# Patient Record
Sex: Female | Born: 1955 | Race: White | Hispanic: No | Smoking: Never smoker
Health system: Southern US, Community
[De-identification: ages and names within clinical notes are randomized; demographics above are authoritative.]

## PROBLEM LIST (undated history)

## (undated) DIAGNOSIS — Z9889 Other specified postprocedural states: Secondary | ICD-10-CM

## (undated) DIAGNOSIS — T7840XA Allergy, unspecified, initial encounter: Secondary | ICD-10-CM

## (undated) DIAGNOSIS — IMO0002 Reserved for concepts with insufficient information to code with codable children: Secondary | ICD-10-CM

## (undated) DIAGNOSIS — C801 Malignant (primary) neoplasm, unspecified: Secondary | ICD-10-CM

## (undated) DIAGNOSIS — R0789 Other chest pain: Secondary | ICD-10-CM

## (undated) DIAGNOSIS — L918 Other hypertrophic disorders of the skin: Secondary | ICD-10-CM

## (undated) DIAGNOSIS — I351 Nonrheumatic aortic (valve) insufficiency: Secondary | ICD-10-CM

## (undated) DIAGNOSIS — R569 Unspecified convulsions: Secondary | ICD-10-CM

## (undated) DIAGNOSIS — N8189 Other female genital prolapse: Secondary | ICD-10-CM

## (undated) DIAGNOSIS — F32A Depression, unspecified: Secondary | ICD-10-CM

## (undated) DIAGNOSIS — R102 Pelvic and perineal pain: Secondary | ICD-10-CM

## (undated) DIAGNOSIS — F419 Anxiety disorder, unspecified: Secondary | ICD-10-CM

## (undated) DIAGNOSIS — R9430 Abnormal result of cardiovascular function study, unspecified: Secondary | ICD-10-CM

## (undated) DIAGNOSIS — I472 Ventricular tachycardia: Secondary | ICD-10-CM

## (undated) DIAGNOSIS — K589 Irritable bowel syndrome without diarrhea: Secondary | ICD-10-CM

## (undated) DIAGNOSIS — M1990 Unspecified osteoarthritis, unspecified site: Secondary | ICD-10-CM

## (undated) DIAGNOSIS — R001 Bradycardia, unspecified: Secondary | ICD-10-CM

## (undated) DIAGNOSIS — K219 Gastro-esophageal reflux disease without esophagitis: Secondary | ICD-10-CM

## (undated) DIAGNOSIS — N816 Rectocele: Secondary | ICD-10-CM

## (undated) DIAGNOSIS — F329 Major depressive disorder, single episode, unspecified: Secondary | ICD-10-CM

## (undated) DIAGNOSIS — S0300XA Dislocation of jaw, unspecified side, initial encounter: Secondary | ICD-10-CM

## (undated) DIAGNOSIS — E7800 Pure hypercholesterolemia, unspecified: Secondary | ICD-10-CM

## (undated) DIAGNOSIS — J45909 Unspecified asthma, uncomplicated: Secondary | ICD-10-CM

## (undated) HISTORY — DX: Reserved for concepts with insufficient information to code with codable children: IMO0002

## (undated) HISTORY — DX: Allergy, unspecified, initial encounter: T78.40XA

## (undated) HISTORY — PX: ABDOMINAL SURGERY: SHX537

## (undated) HISTORY — PX: NOSE SURGERY: SHX723

## (undated) HISTORY — DX: Other hypertrophic disorders of the skin: L91.8

## (undated) HISTORY — PX: NISSEN FUNDOPLICATION: SHX2091

## (undated) HISTORY — DX: Pelvic and perineal pain: R10.2

## (undated) HISTORY — DX: Unspecified asthma, uncomplicated: J45.909

## (undated) HISTORY — PX: CERVICAL ABLATION: SHX5771

## (undated) HISTORY — PX: TONSILLECTOMY: SUR1361

## (undated) HISTORY — DX: Abnormal result of cardiovascular function study, unspecified: R94.30

## (undated) HISTORY — PX: EXTERNAL EAR SURGERY: SHX627

## (undated) HISTORY — DX: Major depressive disorder, single episode, unspecified: F32.9

## (undated) HISTORY — DX: Gastro-esophageal reflux disease without esophagitis: K21.9

## (undated) HISTORY — DX: Irritable bowel syndrome, unspecified: K58.9

## (undated) HISTORY — DX: Dislocation of jaw, unspecified side, initial encounter: S03.00XA

## (undated) HISTORY — DX: Ventricular tachycardia: I47.2

## (undated) HISTORY — DX: Depression, unspecified: F32.A

## (undated) HISTORY — DX: Unspecified convulsions: R56.9

## (undated) HISTORY — PX: CHOLECYSTECTOMY: SHX55

## (undated) HISTORY — PX: SHOULDER SURGERY: SHX246

## (undated) HISTORY — PX: HERNIA REPAIR: SHX51

## (undated) HISTORY — DX: Nonrheumatic aortic (valve) insufficiency: I35.1

## (undated) HISTORY — DX: Other chest pain: R07.89

## (undated) HISTORY — DX: Anxiety disorder, unspecified: F41.9

## (undated) HISTORY — DX: Malignant (primary) neoplasm, unspecified: C80.1

## (undated) HISTORY — DX: Other specified postprocedural states: Z98.890

## (undated) HISTORY — DX: Rectocele: N81.6

## (undated) HISTORY — PX: ABDOMINAL HYSTERECTOMY: SHX81

## (undated) HISTORY — DX: Bradycardia, unspecified: R00.1

## (undated) HISTORY — DX: Other female genital prolapse: N81.89

---

## 1998-10-26 HISTORY — PX: NISSEN FUNDOPLICATION: SHX2091

## 2001-03-24 ENCOUNTER — Ambulatory Visit (HOSPITAL_COMMUNITY): Admission: RE | Admit: 2001-03-24 | Discharge: 2001-03-24 | Payer: Self-pay | Admitting: Family Medicine

## 2001-03-24 ENCOUNTER — Encounter: Payer: Self-pay | Admitting: Family Medicine

## 2001-03-29 ENCOUNTER — Ambulatory Visit (HOSPITAL_COMMUNITY): Admission: RE | Admit: 2001-03-29 | Discharge: 2001-03-29 | Payer: Self-pay | Admitting: Internal Medicine

## 2001-04-05 ENCOUNTER — Ambulatory Visit (HOSPITAL_COMMUNITY): Admission: RE | Admit: 2001-04-05 | Discharge: 2001-04-05 | Payer: Self-pay | Admitting: Internal Medicine

## 2001-04-05 ENCOUNTER — Encounter: Payer: Self-pay | Admitting: Internal Medicine

## 2001-04-13 ENCOUNTER — Other Ambulatory Visit: Admission: RE | Admit: 2001-04-13 | Discharge: 2001-04-13 | Payer: Self-pay | Admitting: Obstetrics and Gynecology

## 2001-04-14 ENCOUNTER — Encounter: Payer: Self-pay | Admitting: Obstetrics and Gynecology

## 2001-04-14 ENCOUNTER — Ambulatory Visit (HOSPITAL_COMMUNITY): Admission: RE | Admit: 2001-04-14 | Discharge: 2001-04-14 | Payer: Self-pay | Admitting: Obstetrics and Gynecology

## 2001-06-07 ENCOUNTER — Encounter: Payer: Self-pay | Admitting: Family Medicine

## 2001-06-07 ENCOUNTER — Ambulatory Visit (HOSPITAL_COMMUNITY): Admission: RE | Admit: 2001-06-07 | Discharge: 2001-06-07 | Payer: Self-pay | Admitting: Family Medicine

## 2001-09-07 ENCOUNTER — Ambulatory Visit (HOSPITAL_COMMUNITY): Admission: RE | Admit: 2001-09-07 | Discharge: 2001-09-07 | Payer: Self-pay | Admitting: General Surgery

## 2001-09-09 ENCOUNTER — Ambulatory Visit (HOSPITAL_COMMUNITY): Admission: RE | Admit: 2001-09-09 | Discharge: 2001-09-09 | Payer: Self-pay | Admitting: General Surgery

## 2001-12-28 ENCOUNTER — Ambulatory Visit: Admission: RE | Admit: 2001-12-28 | Discharge: 2001-12-28 | Payer: Self-pay | Admitting: Otolaryngology

## 2001-12-28 ENCOUNTER — Encounter: Payer: Self-pay | Admitting: Otolaryngology

## 2001-12-28 ENCOUNTER — Ambulatory Visit (HOSPITAL_COMMUNITY): Admission: RE | Admit: 2001-12-28 | Discharge: 2001-12-28 | Payer: Self-pay | Admitting: Otolaryngology

## 2002-04-25 ENCOUNTER — Encounter: Payer: Self-pay | Admitting: Obstetrics and Gynecology

## 2002-04-25 ENCOUNTER — Encounter: Payer: Self-pay | Admitting: Family Medicine

## 2002-04-25 ENCOUNTER — Ambulatory Visit (HOSPITAL_COMMUNITY): Admission: RE | Admit: 2002-04-25 | Discharge: 2002-04-25 | Payer: Self-pay | Admitting: Family Medicine

## 2002-04-25 ENCOUNTER — Ambulatory Visit (HOSPITAL_COMMUNITY): Admission: RE | Admit: 2002-04-25 | Discharge: 2002-04-25 | Payer: Self-pay | Admitting: Obstetrics and Gynecology

## 2002-08-11 ENCOUNTER — Encounter: Payer: Self-pay | Admitting: Family Medicine

## 2002-08-11 ENCOUNTER — Ambulatory Visit (HOSPITAL_COMMUNITY): Admission: RE | Admit: 2002-08-11 | Discharge: 2002-08-11 | Payer: Self-pay | Admitting: Family Medicine

## 2003-07-04 ENCOUNTER — Ambulatory Visit (HOSPITAL_COMMUNITY): Admission: RE | Admit: 2003-07-04 | Discharge: 2003-07-04 | Payer: Self-pay | Admitting: Obstetrics and Gynecology

## 2003-07-04 ENCOUNTER — Encounter: Payer: Self-pay | Admitting: Obstetrics and Gynecology

## 2003-08-08 ENCOUNTER — Ambulatory Visit (HOSPITAL_COMMUNITY): Admission: RE | Admit: 2003-08-08 | Discharge: 2003-08-08 | Payer: Self-pay | Admitting: Otolaryngology

## 2003-08-08 ENCOUNTER — Encounter: Payer: Self-pay | Admitting: Otolaryngology

## 2003-08-09 ENCOUNTER — Encounter: Payer: Self-pay | Admitting: Family Medicine

## 2003-08-09 ENCOUNTER — Ambulatory Visit (HOSPITAL_COMMUNITY): Admission: RE | Admit: 2003-08-09 | Discharge: 2003-08-09 | Payer: Self-pay | Admitting: Family Medicine

## 2003-12-18 ENCOUNTER — Ambulatory Visit (HOSPITAL_COMMUNITY): Admission: RE | Admit: 2003-12-18 | Discharge: 2003-12-18 | Payer: Self-pay | Admitting: Internal Medicine

## 2003-12-28 ENCOUNTER — Ambulatory Visit (HOSPITAL_COMMUNITY): Admission: RE | Admit: 2003-12-28 | Discharge: 2003-12-28 | Payer: Self-pay | Admitting: Family Medicine

## 2004-03-18 ENCOUNTER — Ambulatory Visit (HOSPITAL_COMMUNITY): Admission: RE | Admit: 2004-03-18 | Discharge: 2004-03-18 | Payer: Self-pay | Admitting: Otolaryngology

## 2004-07-04 ENCOUNTER — Encounter: Admission: RE | Admit: 2004-07-04 | Discharge: 2004-07-04 | Payer: Self-pay | Admitting: Family Medicine

## 2004-07-11 ENCOUNTER — Ambulatory Visit (HOSPITAL_COMMUNITY): Admission: RE | Admit: 2004-07-11 | Discharge: 2004-07-11 | Payer: Self-pay | Admitting: Family Medicine

## 2004-09-08 ENCOUNTER — Encounter (INDEPENDENT_AMBULATORY_CARE_PROVIDER_SITE_OTHER): Payer: Self-pay | Admitting: *Deleted

## 2004-09-08 ENCOUNTER — Ambulatory Visit (HOSPITAL_BASED_OUTPATIENT_CLINIC_OR_DEPARTMENT_OTHER): Admission: RE | Admit: 2004-09-08 | Discharge: 2004-09-08 | Payer: Self-pay | Admitting: Otolaryngology

## 2004-09-08 ENCOUNTER — Ambulatory Visit (HOSPITAL_COMMUNITY): Admission: RE | Admit: 2004-09-08 | Discharge: 2004-09-08 | Payer: Self-pay | Admitting: Otolaryngology

## 2005-01-29 ENCOUNTER — Ambulatory Visit: Payer: Self-pay | Admitting: Family Medicine

## 2005-02-19 ENCOUNTER — Ambulatory Visit (HOSPITAL_COMMUNITY): Admission: RE | Admit: 2005-02-19 | Discharge: 2005-02-19 | Payer: Self-pay | Admitting: Family Medicine

## 2005-02-24 ENCOUNTER — Ambulatory Visit (HOSPITAL_COMMUNITY): Admission: RE | Admit: 2005-02-24 | Discharge: 2005-02-24 | Payer: Self-pay | Admitting: Family Medicine

## 2005-03-09 ENCOUNTER — Ambulatory Visit (HOSPITAL_COMMUNITY): Admission: RE | Admit: 2005-03-09 | Discharge: 2005-03-09 | Payer: Self-pay | Admitting: Family Medicine

## 2006-01-12 ENCOUNTER — Ambulatory Visit (HOSPITAL_COMMUNITY): Admission: RE | Admit: 2006-01-12 | Discharge: 2006-01-12 | Payer: Self-pay | Admitting: Family Medicine

## 2006-01-14 ENCOUNTER — Ambulatory Visit (HOSPITAL_COMMUNITY): Admission: RE | Admit: 2006-01-14 | Discharge: 2006-01-14 | Payer: Self-pay | Admitting: Orthopaedic Surgery

## 2006-02-02 ENCOUNTER — Ambulatory Visit (HOSPITAL_COMMUNITY): Admission: RE | Admit: 2006-02-02 | Discharge: 2006-02-02 | Payer: Self-pay | Admitting: Family Medicine

## 2006-04-05 ENCOUNTER — Ambulatory Visit: Payer: Self-pay | Admitting: Internal Medicine

## 2006-04-14 ENCOUNTER — Ambulatory Visit (HOSPITAL_COMMUNITY): Admission: RE | Admit: 2006-04-14 | Discharge: 2006-04-14 | Payer: Self-pay | Admitting: Internal Medicine

## 2006-04-14 ENCOUNTER — Ambulatory Visit: Payer: Self-pay | Admitting: Internal Medicine

## 2006-04-14 HISTORY — PX: ESOPHAGOGASTRODUODENOSCOPY: SHX1529

## 2006-07-22 ENCOUNTER — Encounter: Admission: RE | Admit: 2006-07-22 | Discharge: 2006-07-22 | Payer: Self-pay | Admitting: Family Medicine

## 2006-10-05 ENCOUNTER — Ambulatory Visit: Payer: Self-pay | Admitting: Internal Medicine

## 2006-10-08 ENCOUNTER — Ambulatory Visit (HOSPITAL_COMMUNITY): Admission: RE | Admit: 2006-10-08 | Discharge: 2006-10-08 | Payer: Self-pay | Admitting: Internal Medicine

## 2006-10-25 ENCOUNTER — Ambulatory Visit: Payer: Self-pay | Admitting: Internal Medicine

## 2007-04-14 ENCOUNTER — Encounter: Admission: RE | Admit: 2007-04-14 | Discharge: 2007-04-14 | Payer: Self-pay | Admitting: Family Medicine

## 2007-08-08 ENCOUNTER — Encounter (HOSPITAL_COMMUNITY): Admission: RE | Admit: 2007-08-08 | Discharge: 2007-09-07 | Payer: Self-pay | Admitting: Orthopedic Surgery

## 2008-04-11 ENCOUNTER — Emergency Department (HOSPITAL_COMMUNITY): Admission: EM | Admit: 2008-04-11 | Discharge: 2008-04-12 | Payer: Self-pay | Admitting: Emergency Medicine

## 2009-10-15 ENCOUNTER — Encounter: Payer: Self-pay | Admitting: Family Medicine

## 2009-10-15 ENCOUNTER — Ambulatory Visit (HOSPITAL_COMMUNITY): Admission: RE | Admit: 2009-10-15 | Discharge: 2009-10-15 | Payer: Self-pay | Admitting: Family Medicine

## 2010-02-04 ENCOUNTER — Ambulatory Visit: Payer: Self-pay | Admitting: Internal Medicine

## 2010-02-04 DIAGNOSIS — K219 Gastro-esophageal reflux disease without esophagitis: Secondary | ICD-10-CM | POA: Insufficient documentation

## 2010-02-04 DIAGNOSIS — R1084 Generalized abdominal pain: Secondary | ICD-10-CM

## 2010-02-04 DIAGNOSIS — F449 Dissociative and conversion disorder, unspecified: Secondary | ICD-10-CM | POA: Insufficient documentation

## 2010-02-04 DIAGNOSIS — R143 Flatulence: Secondary | ICD-10-CM

## 2010-02-04 DIAGNOSIS — R142 Eructation: Secondary | ICD-10-CM

## 2010-02-04 DIAGNOSIS — R141 Gas pain: Secondary | ICD-10-CM

## 2010-02-04 DIAGNOSIS — K5909 Other constipation: Secondary | ICD-10-CM

## 2010-02-11 ENCOUNTER — Encounter: Payer: Self-pay | Admitting: Urgent Care

## 2010-02-12 ENCOUNTER — Ambulatory Visit (HOSPITAL_COMMUNITY): Admission: RE | Admit: 2010-02-12 | Discharge: 2010-02-12 | Payer: Self-pay | Admitting: Internal Medicine

## 2010-02-13 ENCOUNTER — Telehealth: Payer: Self-pay | Admitting: Gastroenterology

## 2010-02-13 ENCOUNTER — Telehealth (INDEPENDENT_AMBULATORY_CARE_PROVIDER_SITE_OTHER): Payer: Self-pay

## 2010-02-18 ENCOUNTER — Encounter (INDEPENDENT_AMBULATORY_CARE_PROVIDER_SITE_OTHER): Payer: Self-pay | Admitting: *Deleted

## 2010-02-19 ENCOUNTER — Telehealth (INDEPENDENT_AMBULATORY_CARE_PROVIDER_SITE_OTHER): Payer: Self-pay | Admitting: *Deleted

## 2010-02-20 ENCOUNTER — Encounter: Payer: Self-pay | Admitting: Internal Medicine

## 2010-02-23 HISTORY — PX: ESOPHAGOGASTRODUODENOSCOPY: SHX1529

## 2010-02-23 HISTORY — PX: COLONOSCOPY: SHX174

## 2010-03-06 ENCOUNTER — Ambulatory Visit (HOSPITAL_COMMUNITY): Admission: RE | Admit: 2010-03-06 | Discharge: 2010-03-06 | Payer: Self-pay | Admitting: Internal Medicine

## 2010-03-06 ENCOUNTER — Telehealth (INDEPENDENT_AMBULATORY_CARE_PROVIDER_SITE_OTHER): Payer: Self-pay | Admitting: *Deleted

## 2010-03-06 ENCOUNTER — Ambulatory Visit: Payer: Self-pay | Admitting: Internal Medicine

## 2010-03-11 ENCOUNTER — Encounter: Payer: Self-pay | Admitting: Internal Medicine

## 2010-04-15 ENCOUNTER — Telehealth: Payer: Self-pay | Admitting: Urgent Care

## 2010-04-21 ENCOUNTER — Telehealth (INDEPENDENT_AMBULATORY_CARE_PROVIDER_SITE_OTHER): Payer: Self-pay

## 2010-04-29 ENCOUNTER — Telehealth (INDEPENDENT_AMBULATORY_CARE_PROVIDER_SITE_OTHER): Payer: Self-pay

## 2010-05-05 ENCOUNTER — Telehealth (INDEPENDENT_AMBULATORY_CARE_PROVIDER_SITE_OTHER): Payer: Self-pay

## 2010-06-23 ENCOUNTER — Encounter (INDEPENDENT_AMBULATORY_CARE_PROVIDER_SITE_OTHER): Payer: Self-pay | Admitting: *Deleted

## 2010-07-08 ENCOUNTER — Ambulatory Visit: Payer: Self-pay | Admitting: Internal Medicine

## 2010-07-08 DIAGNOSIS — R1012 Left upper quadrant pain: Secondary | ICD-10-CM | POA: Insufficient documentation

## 2010-07-11 ENCOUNTER — Encounter: Payer: Self-pay | Admitting: Internal Medicine

## 2010-07-14 ENCOUNTER — Encounter: Payer: Self-pay | Admitting: Gastroenterology

## 2010-07-14 ENCOUNTER — Encounter (HOSPITAL_COMMUNITY): Admission: RE | Admit: 2010-07-14 | Discharge: 2010-07-14 | Payer: Self-pay | Admitting: Internal Medicine

## 2010-07-15 ENCOUNTER — Telehealth (INDEPENDENT_AMBULATORY_CARE_PROVIDER_SITE_OTHER): Payer: Self-pay

## 2010-07-16 LAB — CONVERTED CEMR LAB
Albumin: 4.3 g/dL (ref 3.5–5.2)
Basophils Absolute: 0 10*3/uL (ref 0.0–0.1)
Basophils Relative: 0 % (ref 0–1)
Eosinophils Relative: 1 % (ref 0–5)
Hemoglobin: 12.5 g/dL (ref 12.0–15.0)
MCHC: 32.4 g/dL (ref 30.0–36.0)
Monocytes Absolute: 0.4 10*3/uL (ref 0.1–1.0)
Neutro Abs: 2.8 10*3/uL (ref 1.7–7.7)
Platelets: 360 10*3/uL (ref 150–400)
RDW: 13.2 % (ref 11.5–15.5)
Total Bilirubin: 0.3 mg/dL (ref 0.3–1.2)
Total Protein: 7.2 g/dL (ref 6.0–8.3)

## 2010-07-17 ENCOUNTER — Encounter: Payer: Self-pay | Admitting: Internal Medicine

## 2010-07-23 ENCOUNTER — Ambulatory Visit (HOSPITAL_COMMUNITY)
Admission: RE | Admit: 2010-07-23 | Discharge: 2010-07-23 | Payer: Self-pay | Admitting: Physical Medicine and Rehabilitation

## 2010-07-28 ENCOUNTER — Telehealth (INDEPENDENT_AMBULATORY_CARE_PROVIDER_SITE_OTHER): Payer: Self-pay

## 2010-09-04 ENCOUNTER — Ambulatory Visit (HOSPITAL_COMMUNITY)
Admission: RE | Admit: 2010-09-04 | Discharge: 2010-09-04 | Payer: Self-pay | Admitting: Physical Medicine and Rehabilitation

## 2010-10-28 ENCOUNTER — Encounter: Payer: Self-pay | Admitting: Internal Medicine

## 2010-11-15 ENCOUNTER — Encounter: Payer: Self-pay | Admitting: Family Medicine

## 2010-11-16 ENCOUNTER — Encounter: Payer: Self-pay | Admitting: Internal Medicine

## 2010-11-16 ENCOUNTER — Encounter: Payer: Self-pay | Admitting: Family Medicine

## 2010-11-25 NOTE — Progress Notes (Signed)
Summary: phone note/ pt still having pain/? about med for nerve damage   Phone Note Call from Patient   Caller: Patient Summary of Call: Pt called and said she had a test for her GB yesterday, and every since she has had intermittent pain in the  area of her GB. 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  He prescribed Gabapentin 100 mg tabs to gradually increase to 3 tablets daily. She doesn't know if she should take them or wait until her stomach pain is better. Please advise! She is aware that this will not be answered until tomorrow. Initial call taken by: Cloria Spring LPN,  July 15, 2010 4:56 PM     Appended Document: phone note/ pt still having pain/? about med for nerve damage  Discussed with patient. See labs and HIDA addendum. She is to begin gabapentin as per pain mgt MD. May help her LUQ pain which may be neuropathic. Will have her increase Aciphex to two times a day OR add zantac 150mg  for breakthrough gerd. She has had increased gerd this week. Surgical opinion re: gb.   Appended Document: phone note/ pt still having pain/? about med for nerve damage  See append under HIDA.

## 2010-11-25 NOTE — Progress Notes (Signed)
Summary: PT CALLED FOR PHENERGAN  Call pt. Rx sent. West Bali MD  February 13, 2010 4:59 PM      New/Updated Medications: PROMETHAZINE HCL 25 MG TABS (PROMETHAZINE HCL) 1 by mouth q6h as needed nausea or vomiting Prescriptions: PROMETHAZINE HCL 25 MG TABS (PROMETHAZINE HCL) 1 by mouth q6h as needed nausea or vomiting  #30 x 1   Entered and Authorized by:   West Bali MD   Signed by:   West Bali MD on 02/13/2010   Method used:   Electronically to        [facility], [facility] (retail)       [address]       [place], [place]  [address]       Ph: [phone]       Fax: [phone]   RxID:   [id number]

## 2010-11-25 NOTE — Letter (Signed)
Summary: [facility]  [facility]   Imported By: Ave Filter 02/11/2010 14:54:41  _____________________________________________________________________  External Attachment:    Type:   Image     Comment:   External Document

## 2010-11-25 NOTE — Progress Notes (Signed)
Summary: phone note/ questions about meds  Phone Note Call from Patient   Caller: Patient Summary of Call: Pt called and said she is still having some abd pain and bloating and cramping. But she went to the doctor to see about her neck problems and he prescribed Neurontin, but she read if you had problems with GB etc, you should ask your doctor first. She wants  to know if it is OK to take or should she hold off. Also, Dr. Emelda Fear prescribed some Estrogen one mg daily for night sweats.  She has not started that either and wants to know what GI recommendation is. ( She has appt with sureon at [facility] on 08/06/2010) Please advise! Initial call taken by: Cloria Spring LPN,  July 28, 2010 2:05 PM     Appended Document: phone note/ questions about meds As discussed with her on 07/15/10 via phone call, she can begin neurontin. I see no reason why she cannot begin estrogen.   Appended Document: phone note/ questions about meds Pt informed.

## 2010-11-25 NOTE — Progress Notes (Signed)
Summary: phone message/nausea  Phone Note Call from Patient   Caller: Patient Summary of Call: Pt just now called c/o nausea from the contrast yesterday for the CT. Also, not had a BM today. Requesting med for nausea called to [facility] in [place] [phone]. Told her we are leaving the office and not sure if Dr. Darrick Penna would be able to do so, i would send a message. Asked her to call her PCP and she said they take 2 days to call back. Initial call taken by: Cloria Spring LPN,  February 13, 2010 4:50 PM

## 2010-11-25 NOTE — Progress Notes (Signed)
Summary: return pt's call  Phone Note Call from Patient   Reason for Call: Talk to Nurse Summary of Call: Pt has called multiple times wanting to speak with the nurse regarding test results. Please call her at her daughter in law's house [phone] and she also needs to speak with CM about setting up procedure. Initial call taken by: Diana Eves,  February 19, 2010 2:43 PM     Appended Document: return pt's call Informed pt of CT results and to proceed with EGD/TCS, and Durward Mallard will call to schedule.  Appended Document: return pt's call Pt scheduled for egd/tcs 03/06/10@12 :30p.m. Pt aware and insructions mailed to the pt.

## 2010-11-25 NOTE — Progress Notes (Signed)
Summary: aciphex rx  Phone Note Outgoing Call   Call placed by: Hendricks Limes LPN,  May 05, 2010 10:05 AM Summary of Call: called pt to let her know that we got approval for her Aciphex. pt stated she would need rx sent to [facility], she was given samples after her procedure. Called [address]- they do not have rx for Aciphex.  Initial call taken by: Hendricks Limes LPN,  May 05, 2010 10:06 AM     Appended Document: aciphex rx    Prescriptions: ACIPHEX 20 MG TBEC (RABEPRAZOLE SODIUM) 1 by mouth daily for acid reflux  #31 x 5   Entered and Authorized by:   Joselyn Arrow FNP-BC   Signed by:   Joselyn Arrow FNP-BC on 05/05/2010   Method used:   Electronically to        [facility], [facility] (retail)       [address]       [place], [place]  [address]       Ph: [phone]       Fax: [phone]   RxID:   [id number]

## 2010-11-25 NOTE — Letter (Signed)
Summary: HIDA SCAN ORDER  HIDA SCAN ORDER   Imported By: Ave Filter 07/08/2010 14:44:58  _____________________________________________________________________  External Attachment:    Type:   Image     Comment:   External Document

## 2010-11-25 NOTE — Letter (Signed)
Summary: [facility]   Imported By: Rexene Alberts 07/11/2010 10:08:41  _____________________________________________________________________  External Attachment:    Type:   Image     Comment:   External Document

## 2010-11-25 NOTE — Assessment & Plan Note (Signed)
Summary: ABD PAIN,GERD.GU   Visit Type:  Initial Consult Referring Provider:  Thurmond Butts Primary Care Provider:  Thurmond Butts  Chief Complaint:  abd pain/Gerd.  History of Present Illness: 55 y/o caucasian female well known to our practice w/ hc GERD s/p NISSEN, IBS, chronic upper abd pain & bloating.  Last seen 09/2006 and HIDA was ordered, but pt lost insurance, so she never returned to complete her GI work-up.  Hx of hemorrhoids.  Pt did not return at age 85 for colonoscopy.  c/o globus.  c/o certain foods c/o throat irritation, c/o tongue irritation.  c/o left upper ant chest pain, esp when luing on left side.  c/o cramping in epigastrum for hrs, thinks it's hiatal hernia.  c/o epigastric pain, soreness, pressure & cramping.  c/o being miserably uncomfortable after eating.  c/o nausea occ after eating.  On omeprazole 20mg  daily, but worsened heartburn.  BM ok, occ constipation, otherwise regular.Denies rectal bleeding or melena.   Eating nuts & prunes, fiber bars.  Uses castor oil 1-2 spoons, has tried dulcolax.  BM q 3 days at minimum.  c/o early satiety.  Denies dysphagia or odynophagia.  Denies anorexia.    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 Take 1 Tablet By Mouth Once A Day 2)  Omega 3,6,9 .... One Tablet Daily 3)  Calcium 500 Mg Plus D .... Take 1 Tablet By Mouth Two Times A Day 4)  Ventolin Inhaler .... As Needed 5)  Jonne Ply Ronney Asters .... As Needed 6)  Antihistamine .... As Needed 7)  Vear Clock Colon Health  Caps (Probiotic Product) .Marland Kitchen.. 1 By Mouth Daily  Allergies (verified): 1)  ! Sulfa 2)  ! Demerol 3)  ! Cipro 4)  ! Codeine 5)  ! * Skelaxin  Past History:  Past Medical History: CONSTIPATION,  CHRONIC (ICD-564.09) ABDOMINAL BLOATING (ICD-787.3) GLOBUS HYSTERICUS (ICD-300.11) ABDOMINAL PAIN, GENERALIZED, CHRONIC (ICD-789.07) GERD (ICD-530.81) s/p NISSEN  IRRITABLE BOWEL SYNDROME (ICD-564.1) hemorrhoids arthritis osteoporosis  anxiety TMJ  Past Surgical History: Nissen fundoplication Tubal ligation tonsillectomy nasal turbinate surgery partial hyst fatty tumor right shoulder benign incisional hernia repai cervical ablation for atypia  Family History: No known family history of colorectal carcinoma, IBD problems. father deceased lung ca age 55 2 sisters w/ IBS  Social History: remarried, 3 children Patient has never smoked.  Alcohol Use - yes, occ wine Illicit Drug Use - no Smoking Status:  never Drug Use:  no  Review of Systems General:  Denies fever, chills, sweats, anorexia, fatigue, weakness, malaise, weight loss, and sleep disorder. CV:  Denies chest pains, angina, palpitations, syncope, dyspnea on exertion, orthopnea, PND, peripheral edema, and claudication. Resp:  Denies dyspnea at rest, dyspnea with exercise, cough, sputum, wheezing, coughing up blood, and pleurisy. GI:  Denies difficulty swallowing, pain on swallowing, jaundice, change in bowel habits, bloody BM's, black BMs, and fecal incontinence. GU:  Denies urinary burning, blood in urine, nocturnal urination, urinary frequency, and urinary incontinence. Derm:  Denies rash, itching, dry skin, hives, moles, warts, and unhealing ulcers. Psych:  Denies depression, anxiety, memory loss, suicidal ideation, hallucinations, paranoia, phobia, and confusion. Heme:  Denies bruising, bleeding, and enlarged lymph nodes.  Vital Signs:  Patient profile:   55 year old female Height:  63 inches Weight:      158 pounds BMI:     28.09 Temp:     98.4 degrees F oral Pulse rate:   80 / minute BP sitting:   138 / 80  (left arm) Cuff size:   regular  Vitals Entered By: Cloria Spring LPN (February 04, 2010 1:45  PM)  Physical Exam  General:  Well developed, well nourished, no acute distress. Head:  Normocephalic and atraumatic. Eyes:  Sclera clear, no icterus. Ears:  Normal auditory acuity. Nose:  No deformity, discharge,  or lesions. Mouth:  No deformity or lesions, dentition normal. Neck:  Supple; no masses or thyromegaly. Lungs:  Clear throughout to auscultation. Heart:  Regular rate and rhythm; no murmurs, rubs,  or bruits. Abdomen:  normal bowel sounds, LLQ tenderness, epigastric tenderness, without guarding, and without rebound.   Msk:  Symmetrical with no gross deformities. Normal posture. Pulses:  Normal pulses noted. Extremities:  No clubbing, cyanosis, edema or deformities noted. Neurologic:  Alert and  oriented x4;  grossly normal neurologically. Skin:  Intact without significant lesions or rashes. Cervical Nodes:  No significant cervical adenopathy. Psych:  Alert and cooperative. Normal mood and affect.  Impression & Recommendations:  Problem # 1:  ABDOMINAL PAIN, GENERALIZED, CHRONIC (ICD-789.07) 55 y.o caucasian female w/ multiple GI concerns.  Most pressing is her recurrent epigastric/generalized bd pain /pressure that is worsened by eating.  Hx of NISSEN for GERD "feels like hernia bulges or twists when I eat."? biliary dyskinesia, symptomatic hernia, intermittant intusussecption, chronic constipation, diverticulitis, pancreatitis , IBS & GERD w/ NISSEN failure all remain in differential.  She is also overdue for screening colonoscopy.     Plan for CT abd/pelvis w/ IV/oral CM as soon as possible.  If nothing to explain pain, proceed w/ colonoscopy & EGD for further evaluation.  Begin digestive advantage gas/bloat daily.  Request recent labs Dr Jorene Guest for review.  To ER if sevee pain.  Cont PPI.  Offered pain meds, pt declined.  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

## 2010-11-25 NOTE — Letter (Signed)
Summary: EGD/TCS ORDER  EGD/TCS ORDER   Imported By: Ave Filter 02/20/2010 08:42:07  _____________________________________________________________________  External Attachment:    Type:   Image     Comment:   External Document

## 2010-11-25 NOTE — Progress Notes (Signed)
Summary: PPI  LMOM for return call.  Pt failed omeprazole previously.  What other PPIs has she tried or will her insurance cover?  ---- Converted from flag ---- ---- 04/15/2010 10:00 AM, Peggyann Shoals wrote: Pt called wanting to know if there was something else besides Aciphex that could be called in to [facility]. She has medicaid and they will not pay for Aciphex- She can be reached at [phone] ------------------------------  Appended Document: PPI Medicaid preferred is Omeprazole and Nexium. pt has tried and failed Omeprazole. She stated Aciphex was working good but is willing to try Nexium. Told her she needed to try nexium for at least 30 days and to call us back and let us know if its not working and we will try PA for Aciphex. pt uses [employer]  Appended Document: [id number]    Prescriptions: NEXIUM 40 MG CPDR (ESOMEPRAZOLE MAGNESIUM) 1 by mouth daily for acid reflux  #31 x 0   Entered and Authorized by:   Joselyn Arrow FNP-BC   Signed by:   Joselyn Arrow FNP-BC on 04/15/2010   Method used:   Electronically to        [facility], [facility] (retail)       [address]       [place], [place]  [address]       Ph: [phone]       Fax: [phone]   RxID:   [id number]

## 2010-11-25 NOTE — Progress Notes (Signed)
  Phone Note Call from Patient   Summary of Call: pt called to say RMR did her procedure and for her to come pick up Aciphex samples. I have 6 bottles of 3 up front and nurses are aware. Initial call taken by: Diana Eves,  Mar 06, 2010 3:14 PM

## 2010-11-25 NOTE — Progress Notes (Signed)
Summary: nexium causing nausea  Phone Note Call from Patient Call back at Home Phone [phone]   Caller: Patient Summary of Call: pt called- left voicemail- has been taking the nexium about 1 week now and has had alot of nausea. pt stated it was so bad that sometimes she cant even get out of the bed and function normally. Aciphex was working well. Can we proceed with Aciphex PA now? please advise Initial call taken by: Hendricks Limes LPN,  April 29, 980 2:55 PM     Appended Document: nexium causing nausea Start prior auth please  Appended Document: nexium causing nausea paperwork on [facility] cart

## 2010-11-25 NOTE — Letter (Signed)
Summary: Patient Notice, Endo Biopsy Results  [facility]  [address]   [place], [place] [address]   Phone: [phone]  Fax: [phone]       Mar 11, 2010   Destiny Young [address] [place], [place]  [address] September 06, 1956    Dear [name],  I am pleased to inform you that the biopsies taken during your recent endoscopic examination did not show any evidence of cancer upon pathologic examination.  Additional information/recommendations:   Continue with the treatment plan as outlined on the day of your exam.  You should have a repeat endoscopic colonoscopy examination in 10 years.   Please call us if you are having persistent problems or have questions about your condition that have not been fully answered at this time.  Sincerely,    R. Roetta Sessions MD, FACP [facility] Ph: [phone]   Fax: [phone]   Appended Document: Patient Notice, Endo Biopsy Results Letter mailed to pt. Also, copy to Dr. Thurmond Butts  with copy of path.

## 2010-11-25 NOTE — Assessment & Plan Note (Signed)
Summary: ABD PAIN/LEFT SIDE RIB PAIN/LAW   Visit Type:  f/u Primary Care Provider:  Dr. Shelva Majestic  Chief Complaint:  abdominal pain/ left side rib pain.  History of Present Illness: Ms. Destiny Young is here for f/u visit. She has h/o GERD s/p Nissen, IBS, chronic upper abd pain and bloating. She had EGD/TCS recently that showed gastritis but negative for H.Pylori. TCS/TI unremarkable. She presents with multiple complaints. She c/o ten day h/o LUQ pain, worse with movement and meals. Now more intermittent, burning in quality. Severe at times. Constipation better on benefiber (1-2 tsp daily).  C/O poor energy level, poor sleep. Eats small meals 5-6 times per day. Stomach rumbles alot. Heartburn better on Aciphex. No n/v. Night sweats. Miserable since Nissen wrap in 2000.  Weight up in last one year, 30 pounds. Last bloodwork done in early spring. Documented weight gain of 20 pounds since 2007 OV.     Current Medications (verified): 1)  Vitamin B 12 .... Take 1 Tablet By Mouth Once A Day 2)  Omega 3,6,9 .... One Tablet Daily 3)  Calcium 500 Mg Plus D .... Take 1 Tablet By Mouth Two Times A Day 4)  Ventolin Inhaler .... As Needed 5)  Jonne Ply Ronney Asters .... As Needed 6)  Antihistamine .... As Needed 7)  Promethazine Hcl 25 Mg Tabs (Promethazine Hcl) .Marland Kitchen.. 1 By Mouth Q6h As Needed Nausea or Vomiting 8)  Aciphex 20 Mg Tbec (Rabeprazole Sodium) .Marland Kitchen.. 1 By Mouth Daily For Acid Reflux 9)  Vitamin E .... Take 1 Tablet By Mouth Once A Day 10)  Benefiber .... As Directed Daily 11)  Clonazepam 0.5 Mg Tabs (Clonazepam) .... As Needed At Bedtime  Allergies (verified): 1)  ! Sulfa 2)  ! Demerol 3)  ! Cipro 4)  ! Codeine 5)  ! * Skelaxin  Past History:  Past Medical History: CONSTIPATION, CHRONIC (ICD-564.09) ABDOMINAL BLOATING (ICD-787.3) GLOBUS HYSTERICUS (ICD-300.11) ABDOMINAL PAIN, GENERALIZED, CHRONIC (ICD-789.07) GERD (ICD-530.81) s/p NISSEN  IRRITABLE BOWEL SYNDROME  (ICD-564.1) hemorrhoids arthritis osteoporosis  anxiety TMJ EGD/TCS/TI, 5/11-->gastritis, no h.pylori  Past Surgical History: Nissen fundoplication Tubal ligation tonsillectomy nasal turbinate surgery partial hyst fatty tumor right shoulder benign incisional hernia repair cervical ablation for atypia  Review of Systems      See HPI  Vital Signs:  Patient profile:   55 year old female Height:      63 inches Weight:      160 pounds BMI:     28.45 Temp:     98.1 degrees F oral Pulse rate:   84 / minute BP sitting:   120 / 72  (left arm) Cuff size:   regular  Vitals Entered By: Cloria Spring LPN (July 08, 2010 1:22 PM)  Physical Exam  General:  Well developed, well nourished, no acute distress. Head:  Normocephalic and atraumatic. Eyes:  sclera nonicteric Mouth:  op moist Neck:  Supple; no masses or thyromegaly. Lungs:  Clear throughout to auscultation. Heart:  Regular rate and rhythm; no murmurs, rubs,  or bruits. Abdomen:  Soft. ND. Tender in LUQ/left rib cage. No CVA tenderness. No rebound or guarding. No HSM or masses. No abd bruit. Small ventral hernia above umbilicus  Impression & Recommendations:  Problem # 1:  LUQ PAIN (ICD-789.02)  Patient c/o LUQ pain related to meals and movement. No known injury. Feels pain in left ribcage as well. No CVA tenderness or urinary symptoms. No rash. H/O chronic pp abd bloating, GERD, alteration of bowels. Discussed options of completing gb w/u  since she does have so many pp symptoms. The LUQ pain is unlikely due to pancreatitis. ?musculoskeletal/neuropathic. She notes symptoms off/on since her Nissen fundoplication in 2000. She wonders if she should have the wrap undone. She has small ventral hernia which is unlikely the source of this pain. 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 No mention of gb dz or stones on CT. Next step HIDA as per Dr. Luvenia Starch previous plan.  Patient also has  numerous complaints including weight gain, fatigue, insomnia, sweats. Suspect some are menopausal/perimenopausal symptoms. Retrieve labs from [facility]. Further recommendations to follow.  Orders: Est. Patient Level III ([address])  Problem # 2:  CONSTIPATION, CHRONIC (ICD-564.09)  Better on benefiber.  Orders: Est. Patient Level III ([address])  Problem # 3:  GERD (ICD-530.81)  Better on Aciphex.   Orders: Est. Patient Level III ([address])  Appended Document: Orders Update Received labs from [facility] from 3/11:  WBC 6300, H/H 12.7/38.2, Plt 337,000, glu 94, BUN 8, Cre 0.84, Tbili 0.4, AP 75, AST 16, ALT 16, alb 4.4, TSH 0.97, B12 241, Folate 16  Patient is going to see gyn regarding possible menopausal symptoms.  Let's get current labs, see orders. Includes CBC, LFTs, lipase. Please let pt know.   Clinical Lists Changes  Orders: Added new Test order of T-CBC w/Diff [id number]) - Signed Added new Test order of T-Hepatic Function [id number]) - Signed Added new Test order of T-Lipase [phone]) - Signed      Appended Document: ABD PAIN/LEFT SIDE RIB PAIN/LAW pt aware, lab order faxed to lab

## 2010-11-25 NOTE — Letter (Signed)
Summary: Plan of Care, Need to Discuss  [facility]  [address]   [place], [place] [address]   Phone: [phone]  Fax: [phone]    February 18, 2010  MACHELL WIRTHLIN [address] [place], [place]  [address] 24-Jun-1956   Dear Ms. Bouyer,   We are writing this letter to inform you of treatment plans and/or discuss your plan of care.  We have tried several times to contact you; however, we have yet to reach you.  We ask that you please contact our office for follow-up on your gastrointestinal issues.  We can  be reached at [phone] to schedule an appointment, or to speak with someone regarding your health care needs.  Please do not neglect your health.   Sincerely,    Ave Filter  [facility] Ph: [phone]    Fax: [phone]

## 2010-11-25 NOTE — Progress Notes (Signed)
Summary: phone note/ abd pain  Phone Note Call from Patient   Caller: Patient Summary of Call: Pt called to say she had a very severe episode of right side rib-cage pain early this AM. Lasted about 45 min. Said she had severe crampy pains and almost felt like her ribs was going to fracture. She had eaten honey battered chicken nuggets last night. Had alot of  gas. Said she was told she may have to follow-up for Gall bladder. Said she has not picked up Nexium yet, her huisband was supposed to pick up and forgot, but she will pick up this week. Please advise! Initial call taken by: Cloria Spring LPN,  April 21, 2010 1:10 PM     Appended Document: phone note/ abd pain Pick up & start Nexium ASAP.  If pain persists, needs FU OV.  Appended Document: phone note/ abd pain Pt informed.

## 2010-11-25 NOTE — Medication Information (Signed)
Summary: Tax adviser   Imported By: Rosine Beat 06/23/2010 08:55:24  _____________________________________________________________________  External Attachment:    Type:   Image     Comment:   External Document

## 2010-11-25 NOTE — Letter (Signed)
Summary: DR Derrell Lolling REFERRAL  DR Derrell Lolling REFERRAL   Imported By: Ave Filter 07/17/2010 10:09:55  _____________________________________________________________________  External Attachment:    Type:   Image     Comment:   External Document

## 2010-11-25 NOTE — Letter (Signed)
Summary: CT SCAN ORDER  CT SCAN ORDER   Imported By: Ave Filter 02/04/2010 15:50:48  _____________________________________________________________________  External Attachment:    Type:   Image     Comment:   External Document

## 2010-11-27 NOTE — Letter (Signed)
Summary: [facility]  [facility]   Imported By: Rexene Alberts 10/28/2010 16:05:49  _____________________________________________________________________  External Attachment:    Type:   Image     Comment:   External Document

## 2010-12-25 IMAGING — CR DG KNEE AP/LAT W/ SUNRISE*R*
3 series · 3 of 3 positions shown · non-contrast
Comparison: None.

CLINICAL DATA: Pain.

DG KNEE - 3 VIEWS

[view not recorded (1 of 3)]
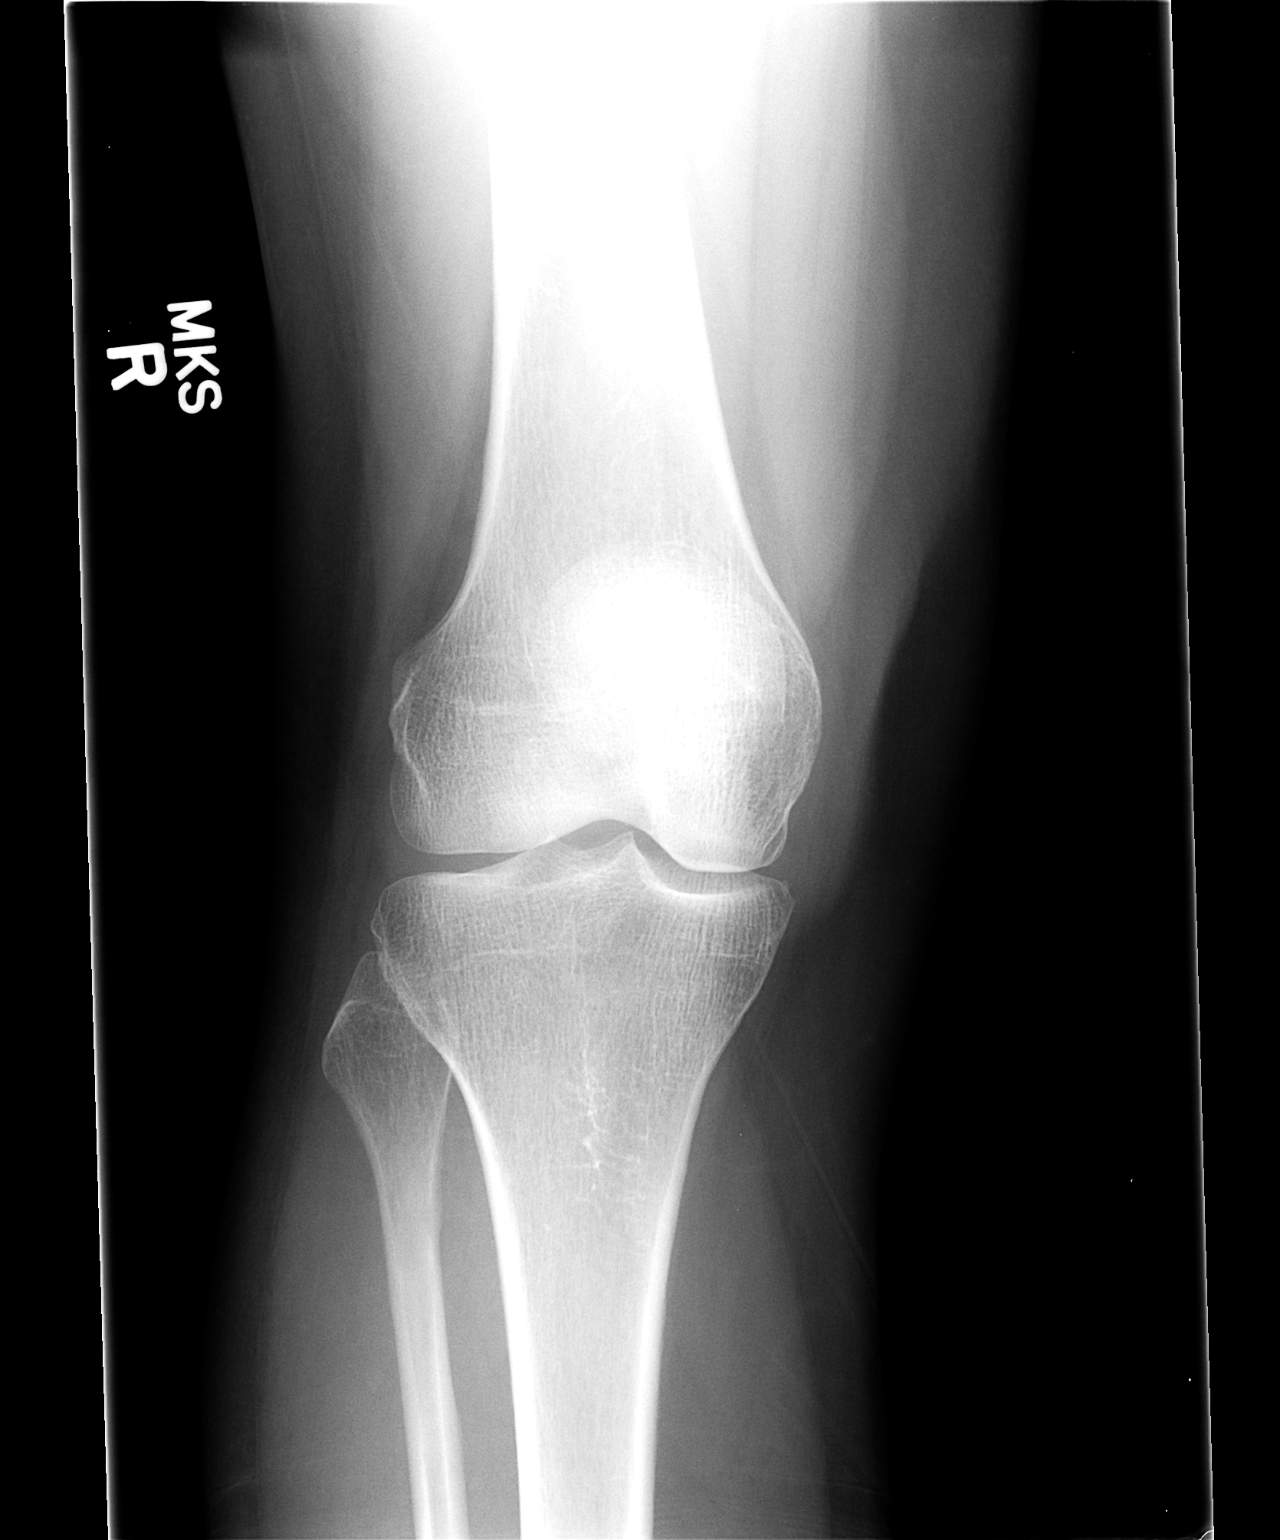

[view not recorded (2 of 3)]
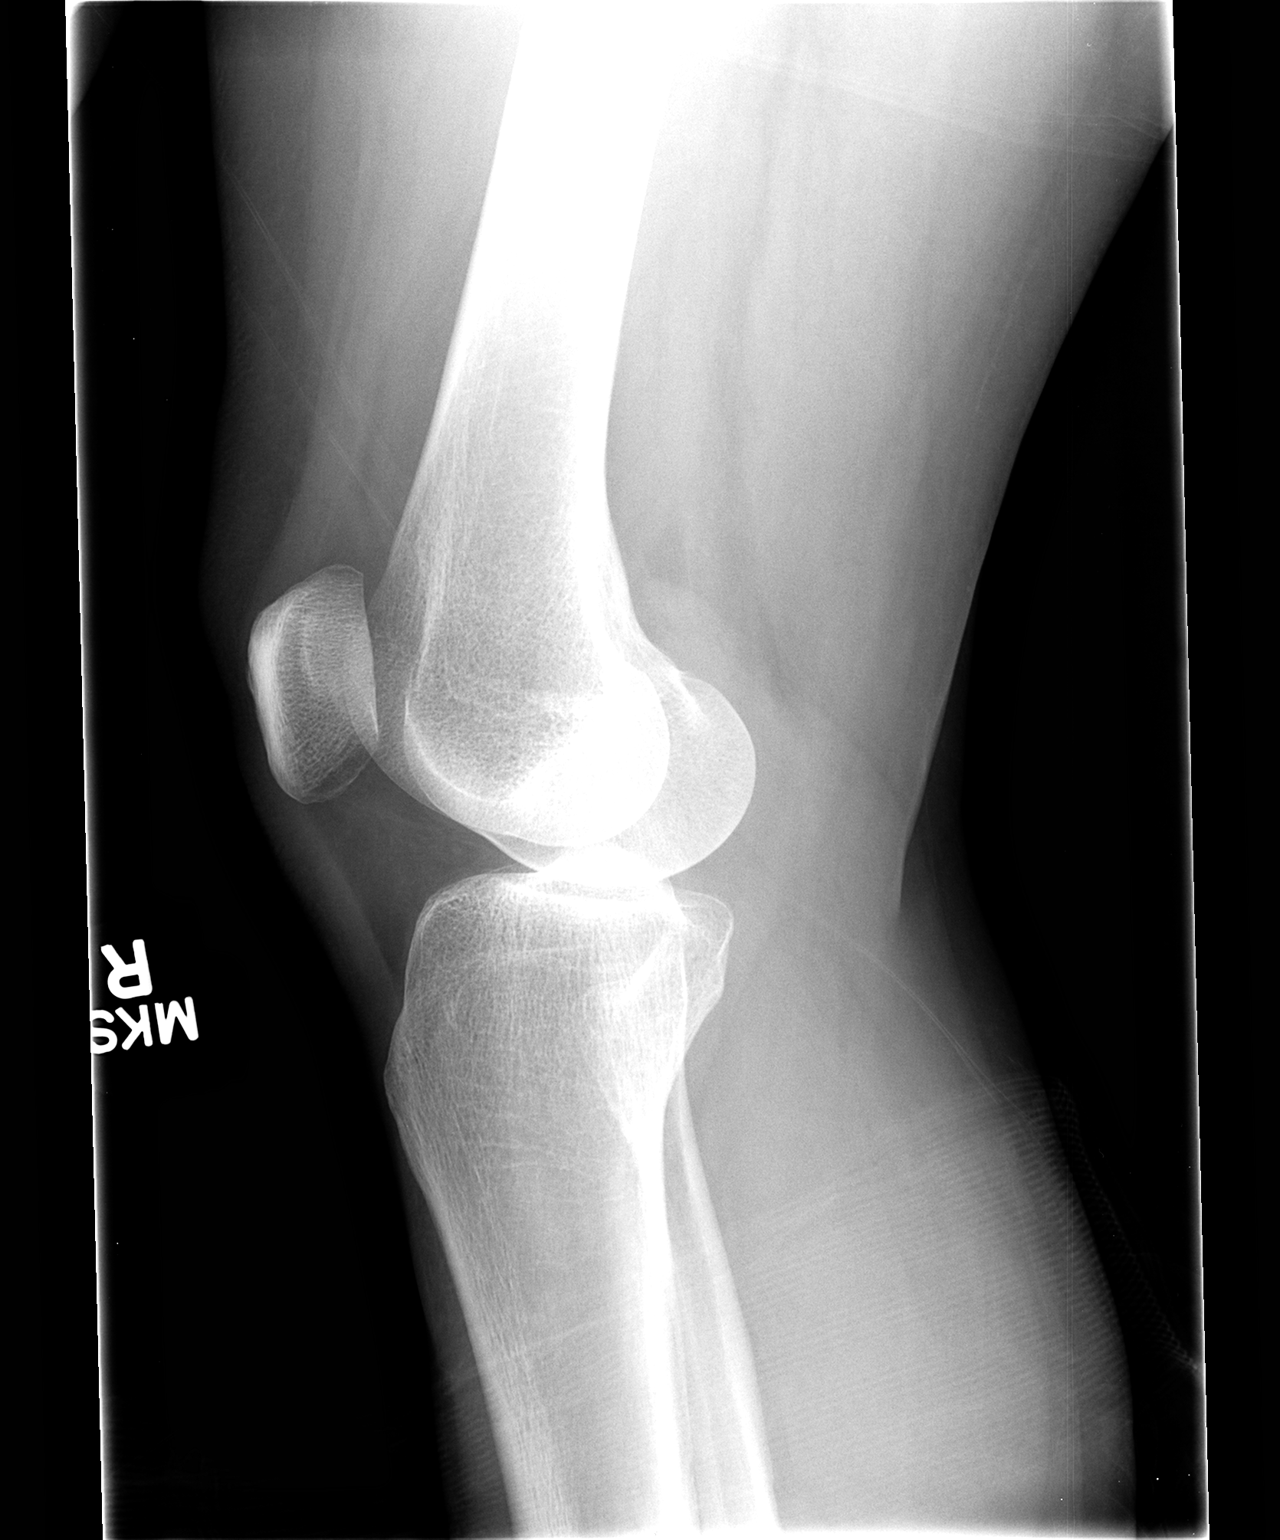

[view not recorded (3 of 3)]
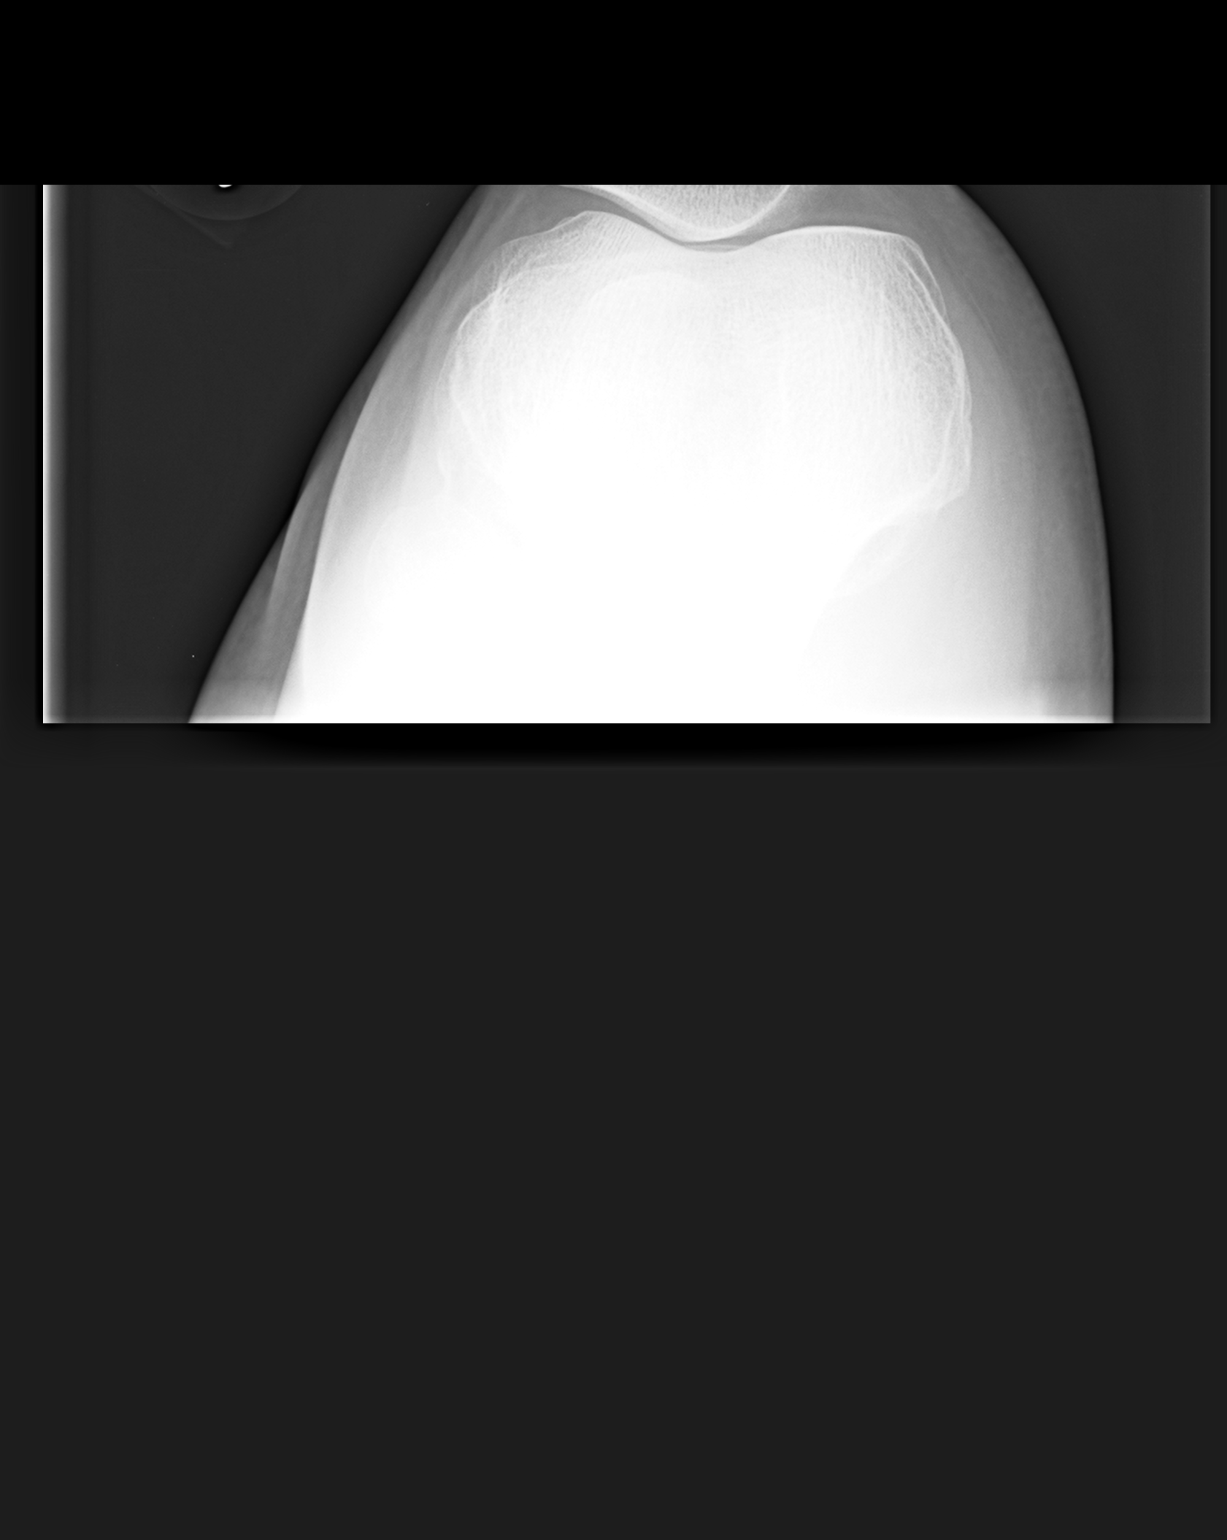

[3 of 3 positions shown; findings below may reference images not displayed]

FINDINGS: Minimal degenerative change is seen in the patellofemoral
compartment.  No fracture, dislocation or joint effusion.
IMPRESSION: Very mild patellofemoral degenerative change.  Otherwise negative.

## 2011-03-02 ENCOUNTER — Ambulatory Visit: Payer: Self-pay | Admitting: Gastroenterology

## 2011-03-13 NOTE — Op Note (Signed)
NAMEYUMA, Destiny Young               ACCOUNT NO.:  [id number]   MEDICAL RECORD NO.:  [id number]          PATIENT TYPE:  AMB   LOCATION:  DSC                          FACILITY:  [facility]   PHYSICIAN:  Karol T. Lazarus Salines, M.D. DATE OF BIRTH:  1956/10/05   DATE OF PROCEDURE:  09/08/2004  DATE OF DISCHARGE:                                 OPERATIVE REPORT   PREOPERATIVE DIAGNOSIS:  Nasal septal deviation, and hypertrophic inferior  turbinates, with obstruction, chronic tonsillitis, chronic throat pain.   POSTOPERATIVE DIAGNOSIS:  Nasal septal deviation, and hypertrophic inferior  turbinates, with obstruction, chronic tonsillitis, chronic throat pain.   PROCEDURE PERFORMED:  1.  Nasal septoplasty (revision), submucous resection inferior turbinates.  2.  Tonsillectomy.  3.  Direct laryngoscopy.  4.  Esophagoscopy.   SURGEON:  Gloris Manchester. Lazarus Salines, M.D.   ANESTHESIA:  General orotracheal.   BLOOD LOSS:  Minimal.   COMPLICATIONS:  None.   FINDINGS:  An overall narrow nose status post prior reduction  septorhinoplasty, especially in the upper lateral cartilage region.  An  overall leftward septal deviation with some prominent maxillary crest  spurring.  Large inferior turbinates bilateral.  2+ right, 3+ left tonsil,  with a normal soft palate.  No significant palpable styloid process.  No  residual adenoids.  No findings on direct laryngoscopy or esophagoscopy.   PROCEDURE:  With the patient in a comfortable supine position, general  orotracheal anesthesia was induced without difficulty.  At an appropriate  level, the patient was turned 90 degrees away from anesthesia.  Nose was  inspected, with the findings as described above.  She had received  preoperative Afrin spray for topical decongestion.  Cocaine crystals, 200 mg  total, were applied on cotton carriers to the anterior ethmoid and  sphenopalatine ganglion regions on both sides.  Cocaine solution, 160 mg  total, was applied on 1/2 x 3  inch Cottonoids to both sides of the nasal  septum.  One percent Xylocaine with 1:100,000 epinephrine, 8 cc total, was  infiltrated into the anterior floor of the nose, into the nasal spine  region, into the membranous columella on both sides, and into the  submucoperichondrial plane of the septum on both sides.  Several minutes  were allowed for this to take full effect.   The cotton carriers were removed.  A rubber tooth guard was placed.  The  direct laryngoscope was introduced, and full inspection of the oropharynx,  hypopharynx, and larynx was performed, with the findings as described above.  The direct laryngoscope was removed.  The cervical esophagoscope was  lubricated and inserted into the hypopharynx and passed gently through the  cricopharyngeus.  It was inserted to its full length with no significant  findings and then withdrawn, with careful visualization on the way out.  This completed the endoscopy.  The rubber tooth guard was removed.   The patient was placed in Trendelenburg.  Taking care to protect lips,  teeth, and endotracheal tube, the Crowe-Davis mouth gag was introduced,  expanded for visualization, and suspended from the Mayo stand in the  standard fashion.  The findings  were as described above.  Palate retractor  and mirror were used to visualize the nasopharynx, with the findings as  described above.  One-half percent Xylocaine with 1:200,000 epinephrine, 8  cc total, was infiltrated into the peritonsillar planes for intraoperative  hemostasis.  Several minutes were allowed for this to take effect.   Beginning on the right side, the tonsil was grasped and retracted medially.  The mucosa overlying the anterior and superior poles was coagulated and then  cut down to the capsule of the tonsil.  Using the cautery tip as a blunt  dissector, coagulating crossing vessels, and lysing fibrous bands as  identified, the tonsil was dissected free of its muscular fossa  from  inferiorly upward.  The tonsil was removed in its entirety as determined by  examination of both tonsil and fossa.  A small additional quantity of  cautery rendered the fossa hemostatic.  After completing the right  tonsillectomy, the mouth gag was repositioned, and the left tonsil was  removed in identical fashion.  At this point, the mouth gag was relaxed for  several minutes.  Upon reexpansion, hemostasis was persistent.  At this  point, the tonsillectomy was completed. Palpation in the tonsil fossae  revealed no significant styloid processes.  A saline moistened throat pack  was placed.   The table was turned back up to anesthesia and was placed in a semi-sitting  position.  The materials were removed from the nose and observed to be  intact and correct in number.  The findings were as described above.  A left-  sided anterior floor incision was executed, and a right-sided  hemitransfixion incision continuing onto a floor incision was executed.  The  floor tunnels were raised on both sides.  There was some fibrosis consistent  with prior surgery.  The submucoperichondrial plane of the septum on the  right side was dissected, and the flap was elevated back onto the  perpendicular plate of the ethmoid, brought inferiorly and communicated with  the floor tunnel, and brought forward.  The right septal flap was raised  intact.  The chondroethmoid junction was identified and opened with the  caudal elevator, and the opposite submucoperiosteal plane of the septum was  elevated.  The superior perpendicular plate was lysed with an open Leane Para-  Middleton forceps, and the inferior portion was submucosally dissected and  rocked free and delivered with a closed Jansen-Middleton forceps.  Small  cartilage and bone spicules were carefully removed.  At this point, the  posterior inferior quadrant of the quadrangular cartilage and a 2 mm strip from the inferior caudal strut were resected to  allow the septum to trap  door into the midline.  The maxillary crest posterior to the caudal strut  was removed using open Jansen-Middleton forceps.  The septum came into the  midline nicely.  There were several small rents in the left septal flap  following this dissection.  The septum was secured to the nasal spine with a  figure-of-eight 4-0 PDS suture.  The septal tunnel was carefully suctioned  free.  The septal incisions were closed with interrupted 4-0 chromic suture.   Just prior to completing the septoplasty, the inferior turbinates were  infiltrated with a total of 6 cc of 1% Xylocaine with 1:100,000 epinephrine.  After completing the septoplasty, beginning on the right side, the anterior  hood of the inferior turbinate was sharply incised, and the medial mucosa  was incised in an anterior upsloping fashion.  A lateral  based flap was  elevated.  The turbinate was infractured.  Using angled turbinate scissors,  the turbinate bone and lateral mucosa were resected in a posterior  downsloping fashion, taking all of the anterior pole and leaving all of the  posterior pole.  Small bony spicules were removed.  The cut edges were  suction coagulated.  The flap was laid back down, the turbinate was  outfractured, and this side was completed.  The left side was done in  identical fashion.   0.040 reinforced Silastic splints were fashioned and placed against the  septum on both sides and secured thereto with a 3-0 nylon stitch.  Triple-  thickness Neosporin-impregnated Telfa pack was fashioned and placed low in  the nose on both sides to support the septum and the inferior turbinate.  Hemostasis was observed.   Throat pack was removed after first carefully suctioning free the pharynx.  Hemostasis was observed in the tonsil fossae.  At this point, the procedure  was completed, and the patient was returned to anesthesia, awakened,  extubated, and transferred to recovery in stable  condition.   COMMENT:  A 55 year old white female with multiple complaints including  recurrent episodes of sore throat and tonsillitis, difficulty swallowing  including pain on the right side, and nasal obstruction with indication for  the several components of today's procedure.  Although we had discussed  doing a styloid process resection, I did not feel any prominent styloid  processes after removing the tonsils.   Anticipated routine postoperative recovery with attention to analgesia,  antibiosis, hydration, and observation for bleeding, emesis, or airway  compromise.  Given geographic distance, she will be observed 23 hours  overnight.  We will remove the nasal packs prior to discharge in the  morning.      Karo   KTW/MEDQ  D:  09/08/2004  T:  09/08/2004  Job:  [id number]   cc:   Zola Button T. Lazarus Salines, M.D.  [address] [place]  [place] [address]  Fax: [phone]   Milus Mallick. Lodema Hong, M.D.  [address] [place], [place] [address]  Fax: [phone]

## 2011-03-13 NOTE — H&P (Signed)
[facility]  Patient:    Destiny Young, Destiny Young Visit Number: [id number] MRN: [id number]          Service Type: END Location: DAY Attending Physician:  Dessa Phi Dictated by:   Elpidio Anis, M.D. Admit Date:  09/07/2001                           History and Physical  HISTORY OF PRESENT ILLNESS:  A 55 year old female with a history of increasing epigastric pain with progressive dysphagia, severe heartburn.  Pain is much more severe over the past three weeks.  She has been treated with proton inhibitors and H2 blockers without improvement.  She is scheduled for upper endoscopy.  PAST HISTORY/REVIEW OF SYSTEMS:  Positive for chronic anxiety syndrome, painful left knee, chronic bloating, heartburn, nausea, and vomiting. She has history of irritable bowel syndrome, anxiety disorder, osteoarthritis, and a ventral hernia.  MEDICATIONS:  Ativan, ibuprofen, Celexa, and Levbid.  ALLERGIES:  SULFA, CODEINE.  PAST SURGICAL HISTORY:  Tubal ligation, hysterectomy with salpingo- oophorectomy, Nissen fundoplication.  PHYSICAL EXAMINATION:  VITAL SIGNS:  Blood pressure 110/70, pulse 62, respirations 18.  HEENT:  Without abnormalities.  NECK:  Supple without JVD or bruit.  CHEST:  Clear to auscultation.  HEART:  Regular rate and rhythm without murmur, gallop, or rub.  ABDOMEN:  Positive ventral hernia, epigastric tenderness.  No masses.  EXTREMITIES:  Unremarkable except for Bakers cyst, left popliteal fossa.  NEUROLOGIC:  Exam nonfocal.  IMPRESSION: 1. Recurrent gastroesophageal reflux disease status post Nissen fundoplication. 2. Osteoarthritis. 3. Anxiety syndrome. 4. ______ syndrome.  PLAN:  Upper endoscopy. Dictated by:   Elpidio Anis, M.D. Attending Physician:  Dessa Phi DD:  09/06/01 TD:  09/06/01 Job: [id number]

## 2011-03-13 NOTE — Op Note (Signed)
NAME:  Destiny Young, Destiny Young               ACCOUNT NO.:  [id number]   MEDICAL RECORD NO.:  [id number]          PATIENT TYPE:  AMB   LOCATION:  DAY                           FACILITY:  [facility]   PHYSICIAN:  R. Roetta Sessions, M.D. DATE OF BIRTH:  1956-05-27   DATE OF PROCEDURE:  04/14/2006  DATE OF DISCHARGE:                                 OPERATIVE REPORT   PROCEDURE:  Diagnostic esophagogastroduodenoscopy.   INDICATION FOR PROCEDURE:  The patient is a lady who has longstanding and  chronic gastroesophageal reflux disease, status post laparoscopic Nissen  fundoplication in 2000.  She now complains of severe odynophagia and a  rawness in her throat and esophagus.   She describes persisting indigestion for which even at times she has  breakthrough symptoms on Prevacid 30 mg orally twice daily.  She does not  have any dysphagia.  EGD is now being done.  This approach has been  discussed with the patient at length, the potential risks, benefits, and  alternatives have been reviewed, questions answered.   PROCEDURE NOTE:  O2 saturation, blood pressure, pulse, and respiration were  monitored throughout the entire procedure.   CONSCIOUS SEDATION:  Versed 3 mg IV, fentanyl 75 mcg IV, Phenergan 25 mg  diluted and given slow IV push prior to the procedure.  Cetacaine spray for  topical oropharyngeal anesthesia.   INSTRUMENT USED:  Olympus video chip system.   FINDINGS:  Examination of the tubular esophagus revealed entirely normal-  appearing esophageal mucosa.  The EG junction was clearly defined at 36 cm  from the incisors.  This was the measurement with the stomach inflated and  deflated.  Gastric cavity then insufflated very well with air and a thorough  examination of the gastric mucosa including a retroflexed view of the  proximal stomach and esophagogastric junction demonstrated an intact Nissen  fundoplication.  It appeared to be slightly floppy but the lower esophageal  sphincter  mechanism appeared to be appropriately snug.  The gastric mucosa  appeared entirely normal.  Pylorus was patent and easily traversed.  Examination of the bulb and second portion revealed no abnormalities.   Therapeutic/diagnostic maneuvers performed:  None.   The patient tolerated the procedure well, was reacted in endoscopy.   IMPRESSION:  Normal-appearing esophagus with esophagogastric junction at 36  cm from the incisors.  Intact Nissen fundoplication, otherwise normal  stomach, D1, D2.   The patient's symptoms are out of proportion to objective findings.  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   RECOMMENDATIONS:  Will add Carafate 1 g slurry q.i.d. to her regimen and  have her come back to see [place] in a few weeks for a recheck.   I doubt this lady has gastroesophageal reflux, but we always have the option  to come back and do Bravo pH monitoring.  However, I am doubtful if reflux  has anything to do with her current symptoms.      Jonathon Bellows, M.D.  Electronically Signed     RMR/MEDQ  D:  04/14/2006  T:  04/14/2006  Job:  [id number]   cc:   Reynolds Bowl, M.D.  [place], [place]

## 2011-03-13 NOTE — Op Note (Signed)
[facility]  Patient:    [name], [name] Visit Number: [id number] MRN: [id number]          Service Type: DSU Location: DAY Attending Physician:  Dessa Phi Dictated by:   Elpidio Anis, M.D. Admit Date:  09/09/2001                             Operative Report  PREOPERATIVE DIAGNOSIS:  Incisional hernia.  POSTOPERATIVE DIAGNOSIS:  Diastasis rectus with scar.  PROCEDURE:  Exploration of abdominal wall.  SURGEON:  Elpidio Anis, M.D.  ANESTHESIA:  General endotracheal anesthesia.  DESCRIPTION OF PROCEDURE:  Under general endotracheal anesthesia, patients abdomen was prepped and draped in a sterile field.  A supraumbilical midline incision was made over the old incision.  Incision was extended down to the fascia.  There was scarring above the fascia and adherent to the fascia but the fascia was intact.  There was no hernia defect.  The attenuated fascia was probed laterally, superiorly and inferiorly down to the umbilicus and no evidence of hernia could be found.  I did not extend the incision but I dissected in the subcutaneous plane along the fascia nearly up the xiphoid and no fascial defect could be found.  It was decided that this was a scar overlying an attenuated rectus muscle without herniation.  The subcutaneous tissue was closed with interrupted 3-0 Biosyn.  Skin was closed with 4-0 Dexon.  Dressing was placed.  Patient was awakened from anesthesia, transferred to a bed and taken to the post-anesthetic care unit. Dictated by:   Elpidio Anis, M.D. Attending Physician:  Dessa Phi DD:  09/09/01 TD:  09/09/01 Job: [id number]

## 2011-03-13 NOTE — Consult Note (Signed)
NAME:  Destiny Young, Destiny Young               ACCOUNT NO.:  [id number]   MEDICAL RECORD NO.:  [id number]          PATIENT TYPE:  AMB   LOCATION:                                FACILITY:  [facility]   PHYSICIAN:  R. Roetta Sessions, M.D. DATE OF BIRTH:  1955/11/24   DATE OF CONSULTATION:  04/05/2006  DATE OF DISCHARGE:                                   CONSULTATION   REASON FOR CONSULTATION:  Refractory GERD.   HISTORY OF PRESENT ILLNESS:  Ms. Destiny Young is a 55 year old Caucasian female.  She has history of chronic GERD.  She is status post laparoscopic nissen  fundoplication on August 08, 1999 by Dr. Franky Macho.  She has been on  multiple PPIs including Prilosec, Nexium, and Prevacid.  She has been on  b.i.d. dosing as well.  She is currently on Prevacid 30 mg daily.  She  denies any problems with heartburn.  She does have indigestion with certain  foods.  Her main complaint today is odynophagia and a rawness to the back  of her throat in the upper part of her esophagus.  She also complains of  irritation to her tongue.  She complains of bloating, feels like belching,  but unable to since her nissen.  She also complains of frequent hiccups.  She complains of water brash.  She also complains of left upper anterior  chest pain.  States this pain worsens with movement.  Denies any dysphagia.  She does have frequent hoarseness and laryngitis.  Denies any anorexia or  early satiety.  She does have some mild epigastric tenderness.  Denies any  nausea or vomiting.   She has history of chronic constipation.  Has responded well to fiber  supplementation.  She has tried Miralax in the past with minimal help as  well as stool softeners with minimal help either.   PAST MEDICAL HISTORY:  1.  Chronic GERD status post nissen fundoplication as described above in      HPI.  2.  Arthritis.  3.  Osteoporosis.  4.  IBS.  5.  Tubal ligation 1990.  6.  Tonsillectomy.  7.  Turbinates removed from her nares in 2005.  8.  Ear and nasal surgery 1990.  9.  Partial hysterectomy 1998.  10. Fatty tumor removed from her right shoulder in 2000 which was benign.  11. She had an incisional hernia repair.  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  13. 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  14. Anxiety.   CURRENT MEDICATIONS:  1.  Prevacid 30 mg daily.  2.  Flexeril 5 mg q.h.s. p.r.n.  3.  Klonopin 0.5 mg p.r.n.  4.  Vitamin E 600 international units daily.  5.  Vitamin B12.  6.  B6 folic acid once daily.  7.  Stool softeners p.r.n.  8.  Hydrocodone p.r.n.  9.  Fibercon two p.o. daily.  10. Omega 3 once daily.  11. Over-the-counter allergy generic p.r.n.  12. BC powders rarely.   ALLERGIES:  SULFA, DEMEROL, and CIPRO.   FAMILY HISTORY:  There is no known family history of colorectal carcinoma,  liver or chronic  GI problems.  Mother in her 19s, has history of IBS.  Father deceased secondary to lung carcinoma age 40.  She has four sisters  and one brother.  Two of her four sisters have IBS as well.   SOCIAL HISTORY:  Ms. Destiny Young is divorced.  She has three children.  They are  healthy.  She is unemployed currently.  She consumes an occasional glass of  wine.  Denies any drug use or tobacco use.   REVIEW OF SYSTEMS:  CONSTITUTIONAL:  She is complaining of some fatigue.  Her weight is still increasing.  CARDIOVASCULAR:  Denies any chest pain or  palpitations other than described in HPI.  PULMONARY:  Denies any shortness  of breath, dyspnea, or hemoptysis.  GI:  See HPI.  GYN:  She has had a  normal breast examination and normal mammogram within the last year.   PHYSICAL EXAMINATION:  VITAL SIGNS:  Weight 140 pounds, height 63 inches,  temperature 97.9, blood pressure 120/76, and pulse 76.  GENERAL:  Ms. Destiny Young is a 55 year old Caucasian female who is alert and  oriented, pleasant, cooperative, in no acute distress.  HEENT:  Sclerae clear, non-icteric.  Conjunctivae pink.  Oropharynx pink and  moist without any lesions.   NECK:  Supple without any mass or thyromegaly.  CHEST:  Heart regular rate and rhythm with normal S1, S2 without murmurs,  clicks, rubs, or gallops.  LUNGS:  Clear to auscultation bilaterally.  ABDOMEN:  Positive bowel sounds x4.  No bruits auscultated.  She does have a  well healed scar just above the umbilicus and two upper abdomen consistent  with previous surgeries.  There is no rebound tenderness or guarding.  No  hepatosplenomegaly or mass.  EXTREMITIES:  Without clubbing or edema bilaterally.  SKIN:  Pink, warm, and dry without any rash or jaundice.  RECTAL:  Deferred.   ASSESSMENT:  Ms. Destiny Young is a 55 year old Caucasian female who has history of  chronic gastroesophageal reflux disease and status post laparoscopic nissen  fundoplication in 2000.  She has severe odynophagia.  Describes a rawness  to her tongue, supraesophageal area as well as her upper esophagus.  She  complains of bloating, frequent hiccups, laryngitis/hoarseness.  She also  has breakthrough indigestion while on b.i.d. PPI.  She is now taking once  daily dosing, but has been tried on multiple modalities in the past.  She  states she has yet to find anything that works well for her.  She also has  some left upper anterior what appears to be chest wall pain left outer  quadrant of the left breast.  She has had a normal mammogram per her report  this year.  I suspect this is due to musculoskeletal/chest wall pain.   I suspect Ms. Destiny Young's laparoscopic nissen fundoplication may have slipped.  She may have erosive reflux esophagitis and further evaluation is warranted.   PLAN:  1.  Will schedule EGD with Dr. Jena Gauss in the near future.  She may have      slipped nissen.  I have discussed the procedure including risks and      benefits which include, but not limited to, bleeding, infection,      perforation, drug reaction __________ consent has been obtained.  2.  Continue Prevacid 30 mg daily. 3.  Further  recommendations to follow.   We would like to thank Dr. Laural Benes for allowing [place] to participate in the  care of Ms. Destiny Young.      Tommy Rainwater  Ivonne Andrew, M.D.  Electronically Signed    KC/MEDQ  D:  04/05/2006  T:  04/05/2006  Job:  [id number]

## 2011-06-09 ENCOUNTER — Other Ambulatory Visit: Payer: Self-pay

## 2011-07-23 LAB — COMPREHENSIVE METABOLIC PANEL
ALT: 13
AST: 16
Alkaline Phosphatase: 54
CO2: 29
Chloride: 108
GFR calc Af Amer: >60
GFR calc non Af Amer: >60
Potassium: 3.8
Sodium: 142
Total Bilirubin: 0.3

## 2011-07-23 LAB — CBC
Hemoglobin: 12.3
RBC: 3.92
WBC: 5.6

## 2011-07-23 LAB — DIFFERENTIAL
Basophils Absolute: 0
Basophils Relative: 0
Eosinophils Absolute: 0
Eosinophils Relative: 1
Lymphs Abs: 2.7

## 2011-07-23 LAB — POCT CARDIAC MARKERS: Troponin i, poc: <0.05

## 2011-07-23 LAB — SEDIMENTATION RATE: Sed Rate: 12

## 2011-07-26 ENCOUNTER — Emergency Department (HOSPITAL_COMMUNITY)
Admission: EM | Admit: 2011-07-26 | Discharge: 2011-07-26 | Disposition: A | Discharge status: Home / Self Care | Payer: Medicaid Other | Attending: Emergency Medicine | Admitting: Emergency Medicine

## 2011-07-26 ENCOUNTER — Emergency Department (HOSPITAL_COMMUNITY): Payer: Medicaid Other

## 2011-07-26 ENCOUNTER — Encounter: Payer: Self-pay | Admitting: Emergency Medicine

## 2011-07-26 DIAGNOSIS — M79609 Pain in unspecified limb: Secondary | ICD-10-CM | POA: Insufficient documentation

## 2011-07-26 DIAGNOSIS — S7000XA Contusion of unspecified hip, initial encounter: Secondary | ICD-10-CM | POA: Insufficient documentation

## 2011-07-26 DIAGNOSIS — S6390XA Sprain of unspecified part of unspecified wrist and hand, initial encounter: Secondary | ICD-10-CM | POA: Insufficient documentation

## 2011-07-26 DIAGNOSIS — S63619A Unspecified sprain of unspecified finger, initial encounter: Secondary | ICD-10-CM

## 2011-07-26 DIAGNOSIS — S7001XA Contusion of right hip, initial encounter: Secondary | ICD-10-CM

## 2011-07-26 DIAGNOSIS — W108XXA Fall (on) (from) other stairs and steps, initial encounter: Secondary | ICD-10-CM | POA: Insufficient documentation

## 2011-07-26 HISTORY — DX: Pure hypercholesterolemia, unspecified: E78.00

## 2011-07-26 MED ORDER — TRAMADOL HCL 50 MG PO TABS: 50.0000 mg | ORAL_TABLET | Freq: Four times a day (QID) | ORAL | Status: AC | PRN

## 2011-07-26 MED ORDER — IBUPROFEN 800 MG PO TABS
800.0000 mg | ORAL_TABLET | Freq: Once | ORAL | Status: AC
  Administered 2011-07-26: 800 mg via ORAL
  Filled 2011-07-26: qty 1

## 2011-07-26 NOTE — ED Notes (Signed)
Pt c/o r hand pain radiating into shoulder and  r hip pain radiating down right leg after tripping and falling.

## 2011-07-26 NOTE — ED Provider Notes (Signed)
History     CSN: [id number] Arrival date & time: 07/26/2011  6:07 PM  Chief Complaint  Patient presents with  . Fall    (Consider location/radiation/quality/duration/timing/severity/associated sxs/prior treatment) Patient is a 55 y.o. female presenting with fall. The history is provided by the patient.  Fall The accident occurred 3 to 5 hours ago. The fall occurred while walking. Distance fallen: pt was descending stairs with a basket of clothes.  her cat got entwined in feet and tripped her.   The point of impact was the right hip (R hand). The pain is present in the right hip (R hand). The pain is moderate. She was ambulatory at the scene. There was no entrapment after the fall. There was no drug use involved in the accident. There was no alcohol use involved in the accident. Pertinent negatives include no visual change, no abdominal pain, no vomiting and no hearing loss. She has tried nothing for the symptoms.    Past Medical History  Diagnosis Date  . High cholesterol     Past Surgical History  Procedure Date  . Abdominal hysterectomy   . Shoulder surgery   . Hernia repair   . Tonsillectomy     History reviewed. No pertinent family history.  History  Substance Use Topics  . Smoking status: Never Smoker   . Smokeless tobacco: Not on file  . Alcohol Use: Yes    OB History    Grav Para Term Preterm Abortions TAB SAB Ect Mult Living   4 3   1  1   3       Review of Systems  Gastrointestinal: Negative for vomiting and abdominal pain.  Musculoskeletal: Positive for joint swelling.  All other systems reviewed and are negative.    Allergies  Ciprofloxacin; Codeine; Meperidine hcl; Metaxalone; and Sulfonamide derivatives  Home Medications  No current outpatient prescriptions on file.  BP 138/85  Pulse 87  Temp(Src) 98.6 F (37 C) (Oral)  Resp 20  Ht 5\' 2"  (1.575 m)  Wt 150 lb (68.04 kg)  BMI 27.44 kg/m2  SpO2 99%  Physical Exam  Nursing note and vitals  reviewed. Constitutional: She is oriented to person, place, and time. She appears well-developed and well-nourished. No distress.  HENT:  Head: Normocephalic and atraumatic.  Eyes: EOM are normal.  Neck: Normal range of motion.  Cardiovascular: Normal rate, regular rhythm and normal heart sounds.   Pulmonary/Chest: Effort normal and breath sounds normal.  Abdominal: Soft. She exhibits no distension. There is no tenderness.  Musculoskeletal: She exhibits tenderness.       Right hip: She exhibits decreased range of motion, tenderness, bony tenderness and swelling. She exhibits no crepitus, no deformity and no laceration.       Hands:      Legs: Neurological: She is alert and oriented to person, place, and time.  Skin: Skin is warm and dry.  Psychiatric: She has a normal mood and affect. Judgment normal.    ED Course  Procedures (including critical care time)  Labs Reviewed - No data to display Dg Hip Complete Right  07/26/2011  *RADIOLOGY REPORT*  Clinical Data: Larey Seat.  Right hip pain.  RIGHT HIP - COMPLETE 2+ VIEW  Comparison: None  Findings: Both hips are normally located.  The pubic symphysis and SI joints are intact.  No pelvic fractures.  No hip fracture.  IMPRESSION: No acute bony findings.  Original Report Authenticated By: P. Loralie Champagne, M.D.   Dg Hand Complete Right  07/26/2011  *  RADIOLOGY REPORT*  Clinical Data: Larey Seat.  Right hand pain.  RIGHT HAND - COMPLETE 3+ VIEW  Comparison: None  Findings: The joint spaces are maintained.  Mild degenerative changes.  No acute fracture.  IMPRESSION:  1.  Mild degenerative changes. 2.  No acute fracture.  Original Report Authenticated By: P. Loralie Champagne, M.D.     No diagnosis found.    MDM          Worthy Rancher, PA 07/26/11 [phone]

## 2011-07-26 NOTE — ED Provider Notes (Signed)
Medical screening examination/treatment/procedure(s) were performed by non-physician practitioner and as supervising physician I was immediately available for consultation/collaboration.   Anais Koenen L Giles Currie, MD 07/26/11 2249 

## 2011-09-08 ENCOUNTER — Other Ambulatory Visit: Payer: Self-pay | Admitting: Obstetrics and Gynecology

## 2011-09-08 DIAGNOSIS — Z139 Encounter for screening, unspecified: Secondary | ICD-10-CM

## 2011-10-23 ENCOUNTER — Ambulatory Visit (HOSPITAL_COMMUNITY): Payer: Medicaid Other

## 2011-11-12 ENCOUNTER — Emergency Department (HOSPITAL_COMMUNITY)
Admission: EM | Admit: 2011-11-12 | Discharge: 2011-11-12 | Disposition: A | Discharge status: Home / Self Care | Payer: Medicaid Other | Attending: Emergency Medicine | Admitting: Emergency Medicine

## 2011-11-12 ENCOUNTER — Other Ambulatory Visit: Payer: Self-pay

## 2011-11-12 ENCOUNTER — Encounter (HOSPITAL_COMMUNITY): Payer: Self-pay

## 2011-11-12 ENCOUNTER — Emergency Department (HOSPITAL_COMMUNITY): Payer: Medicaid Other

## 2011-11-12 DIAGNOSIS — N951 Menopausal and female climacteric states: Secondary | ICD-10-CM | POA: Insufficient documentation

## 2011-11-12 DIAGNOSIS — R55 Syncope and collapse: Secondary | ICD-10-CM | POA: Insufficient documentation

## 2011-11-12 DIAGNOSIS — R5381 Other malaise: Secondary | ICD-10-CM | POA: Insufficient documentation

## 2011-11-12 DIAGNOSIS — R1013 Epigastric pain: Secondary | ICD-10-CM | POA: Insufficient documentation

## 2011-11-12 DIAGNOSIS — R5383 Other fatigue: Secondary | ICD-10-CM | POA: Insufficient documentation

## 2011-11-12 DIAGNOSIS — R42 Dizziness and giddiness: Secondary | ICD-10-CM | POA: Insufficient documentation

## 2011-11-12 DIAGNOSIS — R110 Nausea: Secondary | ICD-10-CM | POA: Insufficient documentation

## 2011-11-12 DIAGNOSIS — I959 Hypotension, unspecified: Secondary | ICD-10-CM | POA: Insufficient documentation

## 2011-11-12 HISTORY — DX: Unspecified osteoarthritis, unspecified site: M19.90

## 2011-11-12 LAB — POCT I-STAT TROPONIN I: Troponin i, poc: 0 ng/mL (ref 0.00–0.08)

## 2011-11-12 LAB — CBC
HCT: 41.8 % (ref 36.0–46.0)
Hemoglobin: 14.1 g/dL (ref 12.0–15.0)
MCHC: 33.7 g/dL (ref 30.0–36.0)

## 2011-11-12 LAB — URINALYSIS, ROUTINE W REFLEX MICROSCOPIC
Bilirubin Urine: NEGATIVE
Glucose, UA: NEGATIVE mg/dL
Hgb urine dipstick: NEGATIVE
Ketones, ur: NEGATIVE mg/dL
pH: 5.5 (ref 5.0–8.0)

## 2011-11-12 LAB — DIFFERENTIAL
Basophils Relative: 0 % (ref 0–1)
Monocytes Absolute: 0.6 10*3/uL (ref 0.1–1.0)
Monocytes Relative: 6 % (ref 3–12)
Neutro Abs: 6.8 10*3/uL (ref 1.7–7.7)

## 2011-11-12 LAB — BASIC METABOLIC PANEL
BUN: 8 mg/dL (ref 6–23)
CO2: 28 mEq/L (ref 19–32)
Chloride: 100 mEq/L (ref 96–112)
Creatinine, Ser: 0.71 mg/dL (ref 0.50–1.10)

## 2011-11-12 MED ORDER — GI COCKTAIL ~~LOC~~
30.0000 mL | Freq: Once | ORAL | Status: AC
  Administered 2011-11-12: 30 mL via ORAL
  Filled 2011-11-12: qty 30

## 2011-11-12 MED ORDER — ONDANSETRON HCL 4 MG/2ML IJ SOLN
4.0000 mg | Freq: Once | INTRAMUSCULAR | Status: AC
  Administered 2011-11-12: 4 mg via INTRAVENOUS
  Filled 2011-11-12: qty 2

## 2011-11-12 MED ORDER — SODIUM CHLORIDE 0.9 % IV BOLUS (SEPSIS)
1000.0000 mL | Freq: Once | INTRAVENOUS | Status: AC
  Administered 2011-11-12: 1000 mL via INTRAVENOUS

## 2011-11-12 NOTE — ED Notes (Signed)
MD at bedside to discuss plan of care

## 2011-11-12 NOTE — ED Notes (Signed)
Pt presents with multiple complaints. Pt states she is having epigastric pain, nausea, low BP, dizziness, weakness, pt also states she feels like her veins are collapsing in left arm and leg. Pt also c/o sweating when lying on left side or bending neck a certain way. NAD at this time. VSS.

## 2011-11-12 NOTE — ED Notes (Signed)
Patient ambulatory to bathroom. Steady gait. No signs of distress. Pain 4\10.

## 2011-11-12 NOTE — ED Notes (Signed)
Normal saline bolus complete; iv saline locked with no signs of infiltration. Given one cola per request. No distress. Pain 4\10. Equal chest rise and fall, regular, unlabored. Call bell and family at bedside.

## 2011-11-12 NOTE — ED Notes (Signed)
Istat drawn. Patient tolerated well. Denies any needs. No distress. Equal chest rise and fall. Call bell within reach.

## 2011-11-12 NOTE — ED Provider Notes (Signed)
History     CSN: [id number]  Arrival date & time 11/12/11  1545   First MD Initiated Contact with Patient 11/12/11 1752      Chief Complaint  Patient presents with  . Weakness  . Dizziness  . Nausea  . Abdominal Pain  . Hypotension     The history is provided by the patient.  near syncope Onset - 5 days ago Course - worsened Worsened by - standing Improved by - sitting Associated symptoms - nausea, dizziness  Pt reports that everytime she stands she feels lightheaded No LOC No HA at this time but does have chronic headaches most of the time No CP at this time, thought at times she reports chest burning and epigastric burning though this is not a new phenomenon No blood loss/rectal bleeding No focal weakness Reports "noises in my right ear" but reports h/o ear surgery in past, and no hearing loss No visual loss No recent falls/trauma No fever  She reports recent viral illness (cough/congestion) that improved but now having episodes of feeling lightheaded  Past Medical History  Diagnosis Date  . High cholesterol   . Arthritis   . Hernia     Past Surgical History  Procedure Date  . Abdominal hysterectomy   . Shoulder surgery   . Hernia repair   . Tonsillectomy     No family history on file.  History  Substance Use Topics  . Smoking status: Never Smoker   . Smokeless tobacco: Not on file  . Alcohol Use: Yes    OB History    Grav Para Term Preterm Abortions TAB SAB Ect Mult Living   4 3   1  1   3       Review of Systems  All other systems reviewed and are negative.    Allergies  Ciprofloxacin; Codeine; Meperidine hcl; Metaxalone; and Sulfonamide derivatives  Home Medications   Current Outpatient Rx  Name Route Sig Dispense Refill  . ALBUTEROL SULFATE HFA 108 (90 BASE) MCG/ACT IN AERS Inhalation Inhale 2 puffs into the lungs every 6 (six) hours as needed. For shortness of breath    . DEXTROMETHORPHAN-GUAIFENESIN 20-400 MG PO TABS Oral Take 1  tablet by mouth at bedtime. For two days    . DULOXETINE HCL 60 MG PO CPEP Oral Take 60 mg by mouth daily.    . MOMETASONE FUROATE 50 MCG/ACT NA SUSP Nasal Place 2 sprays into the nose daily.    . ADULT MULTIVITAMIN W/MINERALS CH Oral Take 1 tablet by mouth daily.    Marland Kitchen OVER THE COUNTER MEDICATION Oral Take 1 tablet by mouth as needed. For cold symptoms: Acetaminophen 325mg /Diphenhydramine 25mg /Phenylephrine 5mg     . RABEPRAZOLE SODIUM 20 MG PO TBEC Oral Take 20 mg by mouth at bedtime as needed. For acid reflux      BP 143/97  Pulse 99  Temp(Src) 97.8 F (36.6 C) (Oral)  Resp 18  Ht 5\' 2"  (1.575 m)  Wt 148 lb (67.132 kg)  BMI 27.07 kg/m2  SpO2 100%  BP 132/68  Pulse 84  Temp(Src) 97.8 F (36.6 C) (Oral)  Resp 18  Ht 5\' 2"  (1.575 m)  Wt 148 lb (67.132 kg)  BMI 27.07 kg/m2  SpO2 100%   Physical Exam CONSTITUTIONAL: Well developed/well nourished HEAD AND FACE: Normocephalic/atraumatic EYES: EOMI/PERRL ENMT: Mucous membranes dry NECK: supple no meningeal signs, no bruits SPINE:entire spine nontender CV: S1/S2 noted, no murmurs/rubs/gallops noted LUNGS: Lungs are clear to auscultation bilaterally, no apparent distress  ABDOMEN: soft, nontender, no rebound or guarding GU:no cva tenderness NEURO: Pt is awake/alert, moves all extremitiesx4 No arm/leg drift No facial droop EXTREMITIES: pulses normal, full ROM SKIN: warm, color normal PSYCH: no abnormalities of mood noted  ED Course  Procedures   Labs Reviewed  CBC  DIFFERENTIAL  BASIC METABOLIC PANEL  I-STAT TROPONIN I  URINALYSIS, ROUTINE W REFLEX MICROSCOPIC   9:28 PM Pt with multiple complaints She reports she has HA for years due to previous concussion She reports that when she lays on her left side she has nausea and "hot flashes" Also reports epigastric burning, but reports this is also chronic as well She was ambulatory in the ED Mildly elevated calcium, can be f/u as outpatient with her PCP Otherwise labs  reassuring Pt feels some of her symptoms are result of previous nissen fundoplication Advised f/u with cardiology if she still has chest burning  The patient appears reasonably screened and/or stabilized for discharge and I doubt any other medical condition or other [facility] requiring further screening, evaluation, or treatment in the ED at this time prior to discharge.    MDM  Nursing notes reviewed and considered in documentation All labs/vitals reviewed and considered xrays reviewed and considered        Date: 11/12/2011  Rate: 66  Rhythm: normal sinus rhythm  QRS Axis: normal  Intervals: normal  ST/T Wave abnormalities: normal  Conduction Disutrbances:none     Joya Gaskins, MD 11/12/11 2133

## 2011-11-12 NOTE — ED Notes (Signed)
Into room to draw troponin.

## 2011-11-12 NOTE — ED Notes (Signed)
Resting sitting up in bed. Pain 5\10. Given one cola and sandwich per request. Denies any other needs at this time. Call bell within reach. Family at bedside. Will continue to monitor.

## 2011-11-12 NOTE — ED Notes (Signed)
Patient report given to this nurse. Assuming care of patient. Resting in bed sitting up. Facial grimacing. Denies needs. Call bell within reach. Family at bedside.

## 2011-11-12 NOTE — ED Notes (Signed)
MD Wickline at bedside to speak with patient.

## 2011-11-12 NOTE — ED Notes (Signed)
Patient now having complaints of feeling hot and dizzy. Bed in low position and locked with side rails up. Family at bedside. Call bell within reach. MD made aware.

## 2011-11-15 IMAGING — CR DG HIP COMPLETE 2+V*R*
3 series · 3 of 3 positions shown · non-contrast
Comparison: None

CLINICAL DATA: Fell.  Right hip pain.

RIGHT HIP - COMPLETE 2+ VIEW

[view not recorded (1 of 3)]
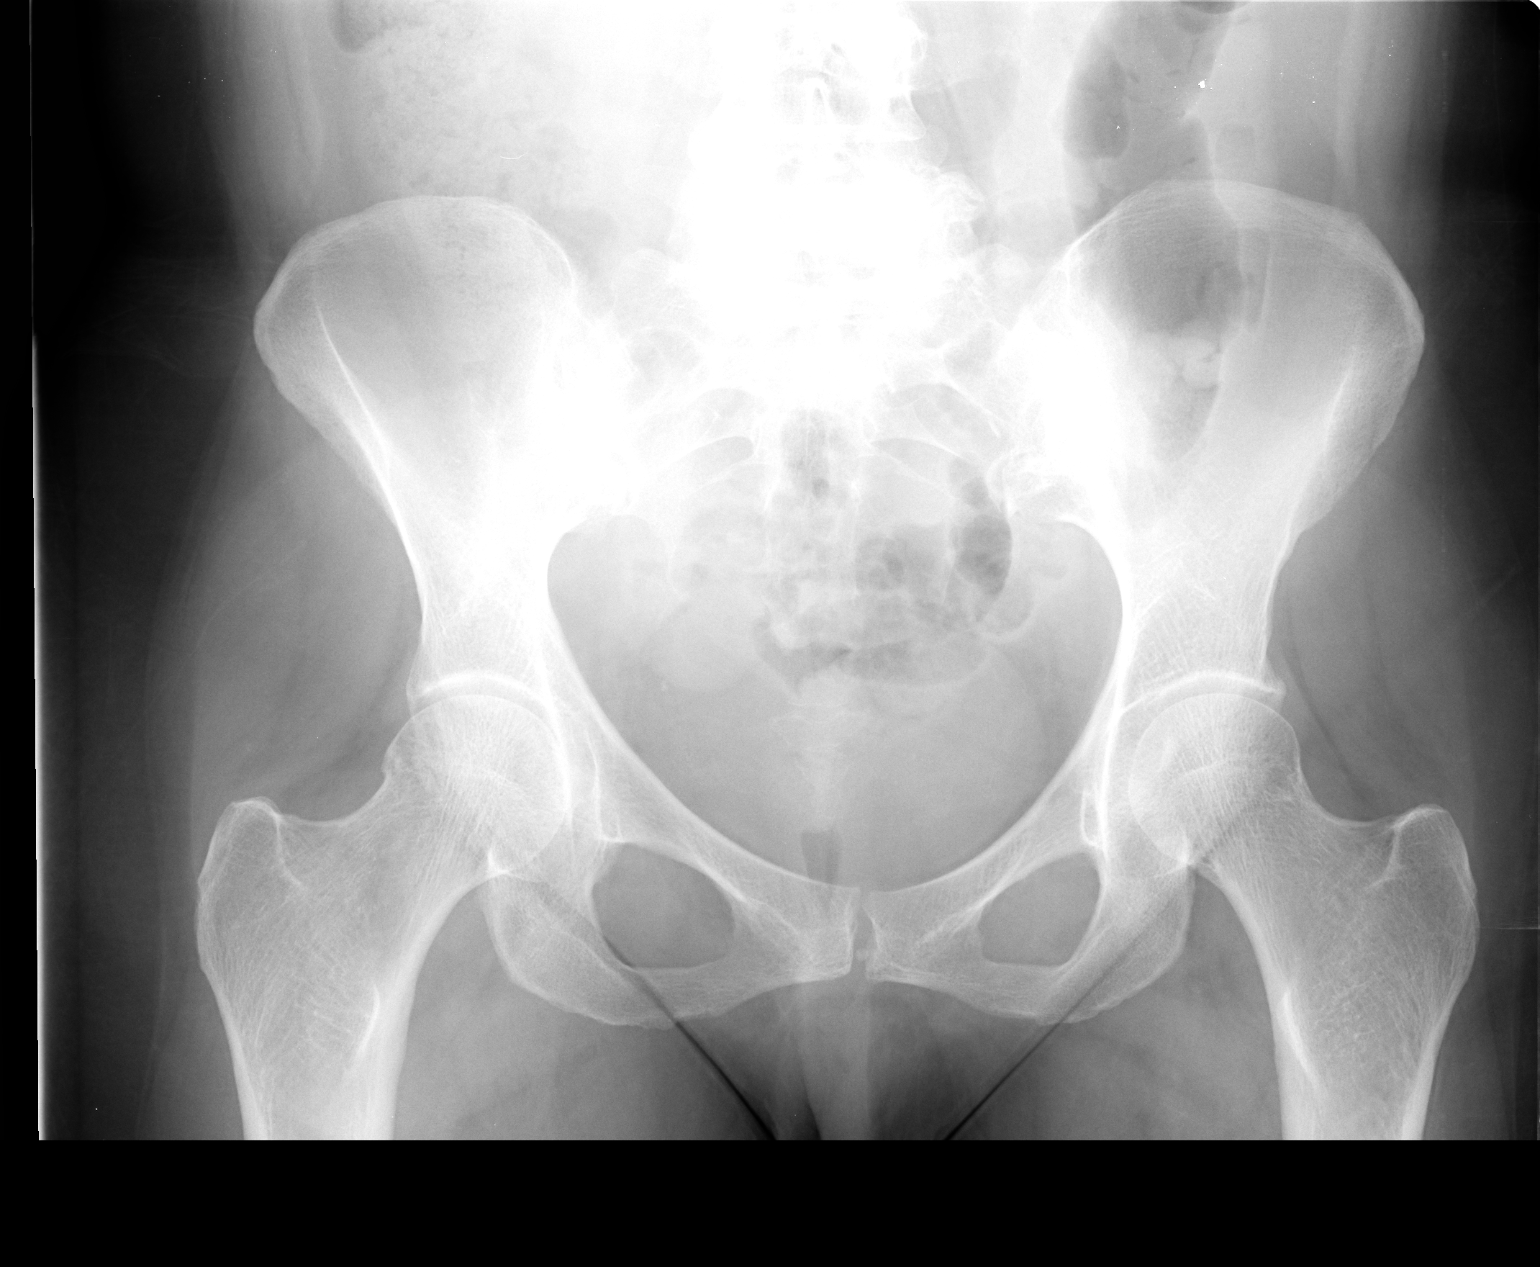

[view not recorded (2 of 3)]
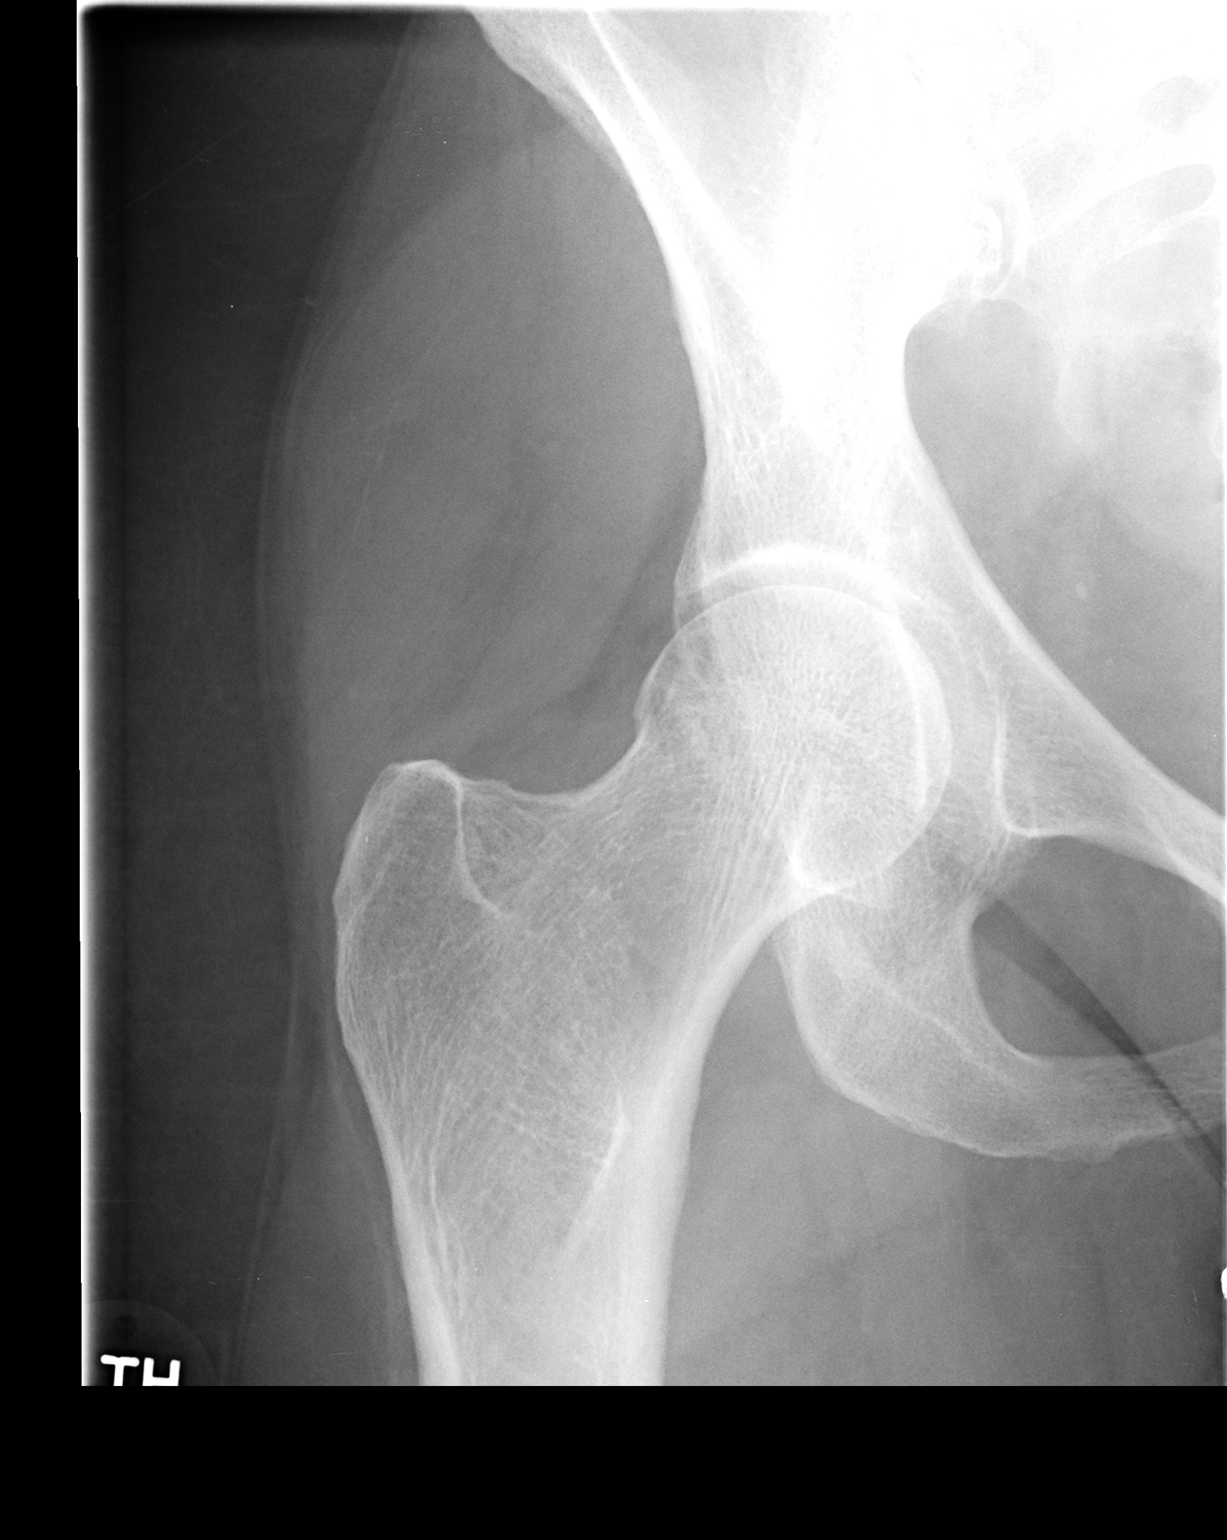

[view not recorded (3 of 3)]
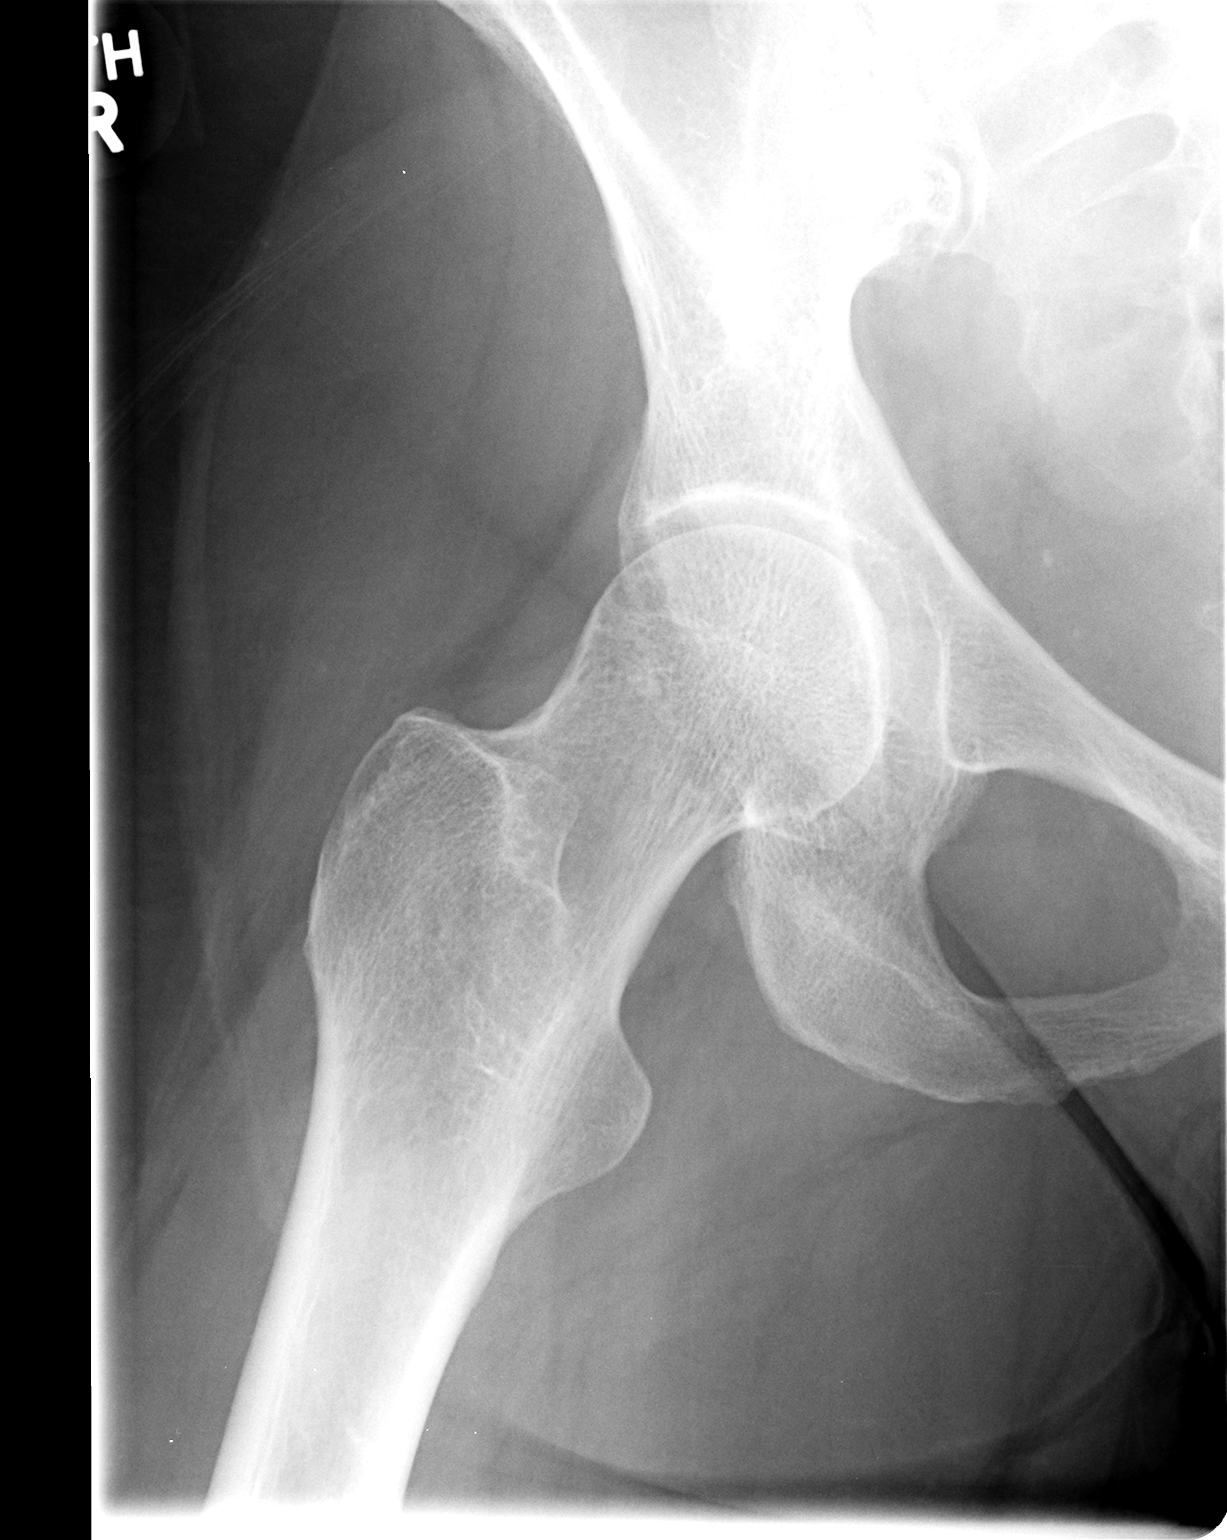

[3 of 3 positions shown; findings below may reference images not displayed]

FINDINGS: Both hips are normally located.  The pubic symphysis and
SI joints are intact.  No pelvic fractures.  No hip fracture.
IMPRESSION: No acute bony findings.

## 2011-11-18 ENCOUNTER — Encounter (HOSPITAL_COMMUNITY): Payer: Self-pay

## 2011-11-18 ENCOUNTER — Emergency Department (HOSPITAL_COMMUNITY)
Admission: EM | Admit: 2011-11-18 | Discharge: 2011-11-18 | Disposition: A | Discharge status: Home / Self Care | Payer: Medicaid Other | Attending: Emergency Medicine | Admitting: Emergency Medicine

## 2011-11-18 ENCOUNTER — Emergency Department (HOSPITAL_COMMUNITY): Payer: Medicaid Other

## 2011-11-18 DIAGNOSIS — R1013 Epigastric pain: Secondary | ICD-10-CM | POA: Insufficient documentation

## 2011-11-18 DIAGNOSIS — M129 Arthropathy, unspecified: Secondary | ICD-10-CM | POA: Insufficient documentation

## 2011-11-18 DIAGNOSIS — Z79899 Other long term (current) drug therapy: Secondary | ICD-10-CM | POA: Insufficient documentation

## 2011-11-18 DIAGNOSIS — E7800 Pure hypercholesterolemia, unspecified: Secondary | ICD-10-CM | POA: Insufficient documentation

## 2011-11-18 LAB — URINE MICROSCOPIC-ADD ON

## 2011-11-18 LAB — COMPREHENSIVE METABOLIC PANEL
ALT: 18 U/L (ref 0–35)
AST: 19 U/L (ref 0–37)
CO2: 30 mEq/L (ref 19–32)
Chloride: 102 mEq/L (ref 96–112)
GFR calc non Af Amer: >90 mL/min (ref >90)
Sodium: 140 mEq/L (ref 135–145)
Total Bilirubin: 0.3 mg/dL (ref 0.3–1.2)

## 2011-11-18 LAB — URINALYSIS, ROUTINE W REFLEX MICROSCOPIC
Glucose, UA: NEGATIVE mg/dL
Hgb urine dipstick: NEGATIVE
Ketones, ur: NEGATIVE mg/dL
Protein, ur: NEGATIVE mg/dL

## 2011-11-18 LAB — DIFFERENTIAL
Basophils Absolute: 0 10*3/uL (ref 0.0–0.1)
Lymphocytes Relative: 36 % (ref 12–46)
Neutro Abs: 2.6 10*3/uL (ref 1.7–7.7)

## 2011-11-18 LAB — CBC
HCT: 34.1 % — ABNORMAL LOW (ref 36.0–46.0)
Platelets: 303 10*3/uL (ref 150–400)
RDW: 12.9 % (ref 11.5–15.5)
WBC: 4.7 10*3/uL (ref 4.0–10.5)

## 2011-11-18 MED ORDER — MORPHINE SULFATE 4 MG/ML IJ SOLN
4.0000 mg | Freq: Once | INTRAMUSCULAR | Status: AC
  Administered 2011-11-18: 4 mg via INTRAVENOUS
  Filled 2011-11-18: qty 1

## 2011-11-18 MED ORDER — KETOROLAC TROMETHAMINE 30 MG/ML IJ SOLN
30.0000 mg | Freq: Once | INTRAMUSCULAR | Status: AC
  Administered 2011-11-18: 30 mg via INTRAVENOUS
  Filled 2011-11-18: qty 1

## 2011-11-18 MED ORDER — ONDANSETRON HCL 4 MG/2ML IJ SOLN
4.0000 mg | Freq: Once | INTRAMUSCULAR | Status: AC
  Administered 2011-11-18: 4 mg via INTRAVENOUS
  Filled 2011-11-18: qty 2

## 2011-11-18 MED ORDER — SODIUM CHLORIDE 0.9 % IV SOLN
Freq: Once | INTRAVENOUS | Status: AC
  Administered 2011-11-18: 20:00:00 via INTRAVENOUS

## 2011-11-18 MED ORDER — OXYCODONE-ACETAMINOPHEN 5-325 MG PO TABS: 1.0000 | ORAL_TABLET | Freq: Four times a day (QID) | ORAL | Status: AC | PRN

## 2011-11-18 MED ORDER — IOHEXOL 300 MG/ML  SOLN
100.0000 mL | Freq: Once | INTRAMUSCULAR | Status: AC | PRN
  Administered 2011-11-18: 100 mL via INTRAVENOUS

## 2011-11-18 NOTE — ED Provider Notes (Signed)
History   This chart was scribed for Geoffery Lyons, MD by Clarita Crane. The patient was seen in room APA09/APA09 and the patient's care was started at 6:59PM.   CSN: [id number]  Arrival date & time 11/18/11  1620   First MD Initiated Contact with Patient 11/18/11 1820      Chief Complaint  Patient presents with  . Abdominal Pain    (Consider location/radiation/quality/duration/timing/severity/associated sxs/prior treatment) HPI Destiny Young is a 56 y.o. female who presents to the Emergency Department complaining of constant moderate to severe lower abdominal pain described as burning radiating to pelvic region onset several days ago and persistent since with associated right flank pain. Patient notes pain is aggravated with lying flat intermittently. Denies dysuria, hematuria, nausea, vomiting. Patient reports she was evaluated 6 days ago in ED for epigastric pain described as burning and notes current pain is similar in nature to pain experienced 6 days ago. Patient with h/o high cholesterol, hernia and partial hysterectomy but denies appendectomy and cholecystectomy.   Past Medical History  Diagnosis Date  . High cholesterol   . Arthritis   . Hernia     Past Surgical History  Procedure Date  . Abdominal hysterectomy   . Shoulder surgery   . Hernia repair   . Tonsillectomy   . Nose surgery   . External ear surgery     No family history on file.  History  Substance Use Topics  . Smoking status: Never Smoker   . Smokeless tobacco: Not on file  . Alcohol Use: No    OB History    Grav Para Term Preterm Abortions TAB SAB Ect Mult Living   4 3   1  1   3       Review of Systems 10 Systems reviewed and are negative for acute change except as noted in the HPI.  Allergies  Ciprofloxacin; Codeine; Meperidine hcl; Metaxalone; Other; and Sulfonamide derivatives  Home Medications   Current Outpatient Rx  Name Route Sig Dispense Refill  . ALBUTEROL SULFATE HFA 108  (90 BASE) MCG/ACT IN AERS Inhalation Inhale 2 puffs into the lungs every 6 (six) hours as needed. For shortness of breath    . CALCIUM CARBONATE ANTACID 500 MG PO CHEW Oral Chew 1 tablet by mouth 6 (six) times daily. For heartburn    . DEXTROMETHORPHAN-GUAIFENESIN 20-400 MG PO TABS Oral Take 1 tablet by mouth at bedtime. For two days    . DULOXETINE HCL 60 MG PO CPEP Oral Take 60 mg by mouth every evening.     . MOMETASONE FUROATE 50 MCG/ACT NA SUSP Nasal Place 2 sprays into the nose daily.    . ADULT MULTIVITAMIN W/MINERALS CH Oral Take 1 tablet by mouth daily.    . AZO-STANDARD PO Oral Take 1 tablet by mouth as needed. For pain relief    . SIMVASTATIN 20 MG PO TABS Oral Take 20 mg by mouth every evening.    . TRAZODONE HCL 100 MG PO TABS Oral Take 100 mg by mouth at bedtime.      BP 139/66  Pulse 81  Temp(Src) 98.1 F (36.7 C) (Oral)  Resp 18  Ht 5\' 2"  (1.575 m)  Wt 147 lb (66.679 kg)  BMI 26.89 kg/m2  SpO2 100%  Physical Exam  Nursing note and vitals reviewed. Constitutional: She is oriented to person, place, and time. She appears well-developed and well-nourished. No distress.  HENT:  Head: Normocephalic and atraumatic.  Eyes: EOM are normal. Pupils are  equal, round, and reactive to light.  Neck: Neck supple. No tracheal deviation present.  Cardiovascular: Normal rate and regular rhythm.  Exam reveals no gallop and no friction rub.   No murmur heard. Pulmonary/Chest: Effort normal. No respiratory distress. She has no wheezes. She has no rales.  Abdominal: Soft. She exhibits no distension. There is tenderness. There is no rebound and no guarding.       Mild tenderness to epigastric region.   Musculoskeletal: Normal range of motion. She exhibits no edema.  Neurological: She is alert and oriented to person, place, and time. No sensory deficit.  Skin: Skin is warm and dry.  Psychiatric: She has a normal mood and affect. Her behavior is normal.    ED Course  Procedures  (including critical care time)  DIAGNOSTIC STUDIES: Oxygen Saturation is 98% on room air, normal by my interpretation.    COORDINATION OF CARE: 7:04PM- Patient informed of current plan for treatment at this time and agrees with plan set forth.  9:50PM- Patient informed of lab and imaging results. Patient states pain has improved at this time following administration of 4mg -Morphine via IV. Will d/c home. Patient is agreeable with plan.  Labs Reviewed  CBC - Abnormal; Notable for the following:    RBC 3.79 (*)    Hemoglobin 11.4 (*)    HCT 34.1 (*)    All other components within normal limits  COMPREHENSIVE METABOLIC PANEL - Abnormal; Notable for the following:    Glucose, Bld 118 (*)    BUN 5 (*)    All other components within normal limits  URINALYSIS, ROUTINE W REFLEX MICROSCOPIC - Abnormal; Notable for the following:    Nitrite POSITIVE (*)    All other components within normal limits  URINE MICROSCOPIC-ADD ON - Abnormal; Notable for the following:    Crystals CA OXALATE CRYSTALS (*)    All other components within normal limits  DIFFERENTIAL  LIPASE, BLOOD   Ct Abdomen Pelvis W Contrast  11/18/2011  *RADIOLOGY REPORT*  Clinical Data: Abdominal pain.  CT ABDOMEN AND PELVIS WITH CONTRAST  Technique:  Multidetector CT imaging of the abdomen and pelvis was performed following the standard protocol during bolus administration of intravenous contrast.  Contrast: OMNIPAQUE IOHEXOL 300 MG/ML IV SOLN  Comparison: CT abdomen and pelvis 02/12/2010.  Findings: A right lower lobe pulmonary nodule has increased in size from the 4 mm to 6.5 mm.  A pleural-based left lower lobe nodule has increased from 4.5 mm to 5.5 mm.  No other nodules are evident. Minimal atelectasis is present.  The heart is mildly enlarged.  No significant pleural or pericardial effusion is present.  Surgical clips are present at the GE junction.  There is some contrast within the distal esophagus and slight dilation of  the distal esophagus.  The liver and spleen are within normal limits. The common bile duct and gallbladder are normal.  The pancreas is unremarkable.  The adrenal glands are normal.  A punctate nonobstructing stone within the left renal collecting system is stable the ureters and collecting systems are within normal limits otherwise.  Minimal atherosclerotic calcifications are evident.  The rectosigmoid colon is within normal limits.  The remainder of the colon is unremarkable.  The appendix is visualized and normal. The small bowel is within normal limits.  No significant adenopathy or free fluid is present.  The bone windows demonstrate facet degenerative changes at L4-5 and L5-S1.  No focal lytic or blastic lesions are present.  IMPRESSION:  1.  No acute or focal abnormality to explain the patient's pain. 2.  Status post hysterectomy. 3.  Postsurgical changes at GE junction with some persistent dilation of the distal esophagus. 4.  Increasing size of bilateral lower lobe pulmonary nodules. Recommend CT the chest with contrast for further evaluation.  This is not emergent could be performed in followup. 5.  Punctate nonobstructing stone within the left renal collecting system. 6.  Degenerative changes in the lower lumbar spine.  Original Report Authenticated By: Jamesetta Orleans. MATTERN, M.D.     No diagnosis found.    MDM  The CT scan looks okay.  Will discharge to home, to follow up as needed if she worsens.  Will prescribe limited quantity of pain medications.      I personally performed the services described in this documentation, which was scribed in my presence. The recorded information has been reviewed and considered.     Geoffery Lyons, MD 11/18/11 2155

## 2011-11-18 NOTE — ED Notes (Signed)
Pt c/o lower abd pain R side greater than L; nausea with no vomiting per pt

## 2011-11-18 NOTE — ED Notes (Signed)
Pt reports came here last week for upper abd pain but now says has lower abd pain.  Reports nausea, no vomiting or diarrhea.  LBM was yesterday.

## 2011-11-18 NOTE — ED Notes (Signed)
Pt stable at discharge with steady gait; discussed community resources for health care questions answers concerns reassured

## 2011-11-24 ENCOUNTER — Encounter: Payer: Medicaid Other | Admitting: Cardiology

## 2011-12-03 ENCOUNTER — Encounter: Payer: Medicaid Other | Admitting: Cardiology

## 2012-03-01 ENCOUNTER — Other Ambulatory Visit (HOSPITAL_COMMUNITY): Payer: Self-pay | Admitting: Family Medicine

## 2012-03-01 ENCOUNTER — Ambulatory Visit (HOSPITAL_COMMUNITY)
Admission: RE | Admit: 2012-03-01 | Discharge: 2012-03-01 | Disposition: A | Discharge status: Home / Self Care | Payer: Medicaid Other | Source: Ambulatory Visit | Attending: Family Medicine | Admitting: Family Medicine

## 2012-03-01 DIAGNOSIS — R1012 Left upper quadrant pain: Secondary | ICD-10-CM

## 2012-03-01 DIAGNOSIS — R1032 Left lower quadrant pain: Secondary | ICD-10-CM | POA: Insufficient documentation

## 2012-03-01 MED ORDER — IOHEXOL 300 MG/ML  SOLN
100.0000 mL | Freq: Once | INTRAMUSCULAR | Status: AC | PRN
  Administered 2012-03-01: 100 mL via INTRAVENOUS

## 2012-06-18 ENCOUNTER — Emergency Department (HOSPITAL_COMMUNITY): Payer: Self-pay

## 2012-06-18 ENCOUNTER — Other Ambulatory Visit: Payer: Self-pay

## 2012-06-18 ENCOUNTER — Encounter (HOSPITAL_COMMUNITY): Payer: Self-pay

## 2012-06-18 ENCOUNTER — Emergency Department (HOSPITAL_COMMUNITY)
Admission: EM | Admit: 2012-06-18 | Discharge: 2012-06-18 | Disposition: A | Discharge status: Home / Self Care | Payer: Self-pay | Attending: Emergency Medicine | Admitting: Emergency Medicine

## 2012-06-18 DIAGNOSIS — R202 Paresthesia of skin: Secondary | ICD-10-CM

## 2012-06-18 DIAGNOSIS — E7800 Pure hypercholesterolemia, unspecified: Secondary | ICD-10-CM | POA: Insufficient documentation

## 2012-06-18 DIAGNOSIS — M549 Dorsalgia, unspecified: Secondary | ICD-10-CM | POA: Insufficient documentation

## 2012-06-18 DIAGNOSIS — Z8739 Personal history of other diseases of the musculoskeletal system and connective tissue: Secondary | ICD-10-CM | POA: Insufficient documentation

## 2012-06-18 DIAGNOSIS — K589 Irritable bowel syndrome without diarrhea: Secondary | ICD-10-CM | POA: Insufficient documentation

## 2012-06-18 DIAGNOSIS — Z79899 Other long term (current) drug therapy: Secondary | ICD-10-CM | POA: Insufficient documentation

## 2012-06-18 DIAGNOSIS — M810 Age-related osteoporosis without current pathological fracture: Secondary | ICD-10-CM | POA: Insufficient documentation

## 2012-06-18 DIAGNOSIS — R000 Tachycardia, unspecified: Secondary | ICD-10-CM | POA: Insufficient documentation

## 2012-06-18 LAB — URINALYSIS, ROUTINE W REFLEX MICROSCOPIC
Leukocytes, UA: NEGATIVE
Protein, ur: NEGATIVE mg/dL
Urobilinogen, UA: 0.2 mg/dL (ref 0.0–1.0)

## 2012-06-18 LAB — CBC WITH DIFFERENTIAL/PLATELET
Basophils Relative: 1 % (ref 0–1)
HCT: 36.7 % (ref 36.0–46.0)
Hemoglobin: 12.6 g/dL (ref 12.0–15.0)
MCHC: 34.3 g/dL (ref 30.0–36.0)
Monocytes Absolute: 0.3 10*3/uL (ref 0.1–1.0)
Monocytes Relative: 5 % (ref 3–12)
Neutro Abs: 3.6 10*3/uL (ref 1.7–7.7)

## 2012-06-18 LAB — COMPREHENSIVE METABOLIC PANEL
Albumin: 4 g/dL (ref 3.5–5.2)
BUN: 9 mg/dL (ref 6–23)
CO2: 29 mEq/L (ref 19–32)
Chloride: 99 mEq/L (ref 96–112)
Creatinine, Ser: 0.72 mg/dL (ref 0.50–1.10)
GFR calc Af Amer: >90 mL/min (ref >90)
GFR calc non Af Amer: >90 mL/min (ref >90)
Total Bilirubin: 0.1 mg/dL — ABNORMAL LOW (ref 0.3–1.2)

## 2012-06-18 LAB — RAPID URINE DRUG SCREEN, HOSP PERFORMED: Opiates: NOT DETECTED

## 2012-06-18 LAB — CARDIAC PANEL(CRET KIN+CKTOT+MB+TROPI): Relative Index: INVALID (ref 0.0–2.5)

## 2012-06-18 MED ORDER — LORAZEPAM 1 MG PO TABS
1.0000 mg | ORAL_TABLET | Freq: Once | ORAL | Status: AC
  Administered 2012-06-18: 1 mg via ORAL
  Filled 2012-06-18: qty 1

## 2012-06-18 MED ORDER — GI COCKTAIL ~~LOC~~
30.0000 mL | Freq: Once | ORAL | Status: AC
  Administered 2012-06-18: 30 mL via ORAL
  Filled 2012-06-18: qty 30

## 2012-06-18 MED ORDER — PANTOPRAZOLE SODIUM 40 MG IV SOLR
40.0000 mg | Freq: Once | INTRAVENOUS | Status: AC
  Administered 2012-06-18: 40 mg via INTRAVENOUS
  Filled 2012-06-18: qty 40

## 2012-06-18 MED ORDER — ONDANSETRON HCL 4 MG/2ML IJ SOLN
4.0000 mg | Freq: Once | INTRAMUSCULAR | Status: AC
  Administered 2012-06-18: 4 mg via INTRAVENOUS
  Filled 2012-06-18: qty 2

## 2012-06-18 MED ORDER — ONDANSETRON HCL 4 MG PO TABS: 4.0000 mg | ORAL_TABLET | Freq: Four times a day (QID) | ORAL | Status: AC

## 2012-06-18 MED ORDER — SODIUM CHLORIDE 0.9 % IV BOLUS (SEPSIS)
1000.0000 mL | Freq: Once | INTRAVENOUS | Status: AC
  Administered 2012-06-18: 1000 mL via INTRAVENOUS

## 2012-06-18 MED ORDER — IBUPROFEN 800 MG PO TABS: 800.0000 mg | ORAL_TABLET | Freq: Three times a day (TID) | ORAL | Status: AC

## 2012-06-18 NOTE — ED Notes (Signed)
Pt reports vomiting, x1 at 3:30 today, orange color (had just taken aspirin), now having right arm numbness, right leg numb and middle of back is numb, and now left leg is going numb.  Right leg has been hurting and cramping for weeks.

## 2012-06-18 NOTE — ED Provider Notes (Signed)
History   This chart was scribed for Glynn Octave, MD scribed by Magnus Sinning. The patient was seen in room APAH4/APAH4 at 18:43   CSN: [id number]  Arrival date & time 06/18/12  7829   First MD Initiated Contact with Patient 06/18/12 1837      Chief Complaint  Patient presents with  . Back Pain  . Numbness    (Consider location/radiation/quality/duration/timing/severity/associated sxs/prior treatment) The history is provided by the patient. No language interpreter was used.   Destiny Young is a 56 y.o. female who presents to the Emergency Department complaining of intermittent moderate numbness at mid back, onset today with associated one episode of emesis,abd pain, right leg cramping located behind knee, left leg numbness, and right arm numbness and tingling.  Patient says she was babysitting her grandkids today when she started having abd pain and one episode of emesis. Explains she's had hx of right arm, and right leg numbness intermittently for past month and that shortly after she vomited today the numbness began in right arm and leg. Says she noticed her left leg also became numb, which she says she has never experienced prior. Explains arm is achy and worsens when she is laying down. Denies hx of  DM,HTN,CP,bladder/bowel incontinence, or consumption of alcohol. Reports she has hx of hyperlipidemia, but that she is otherwise in good condition.   [id number]  Past Medical History  Diagnosis Date  . High cholesterol   . Arthritis   . Hernia   . Abdominal bloating   . Globus hystericus   . IBS (irritable bowel syndrome)   . Hemorrhoids   . Arthritis   . Osteoporosis   . Anxiety   . TMJ (dislocation of temporomandibular joint)     Past Surgical History  Procedure Date  . Abdominal hysterectomy   . Shoulder surgery   . Hernia repair   . Tonsillectomy   . Nose surgery   . External ear surgery     Family History  Problem Relation Age of  Onset  . Lung cancer Father   . Irritable bowel syndrome Sister   . Irritable bowel syndrome Sister     History  Substance Use Topics  . Smoking status: Never Smoker   . Smokeless tobacco: Not on file  . Alcohol Use: No    OB History    Grav Para Term Preterm Abortions TAB SAB Ect Mult Living   4 3   1  1   3       Review of Systems 10 Systems reviewed and are negative for acute change except as noted in the HPI. Allergies  Ciprofloxacin; Codeine; Meperidine hcl; Metaxalone; Other; and Sulfonamide derivatives  Home Medications   Current Outpatient Rx  Name Route Sig Dispense Refill  . ALBUTEROL SULFATE HFA 108 (90 BASE) MCG/ACT IN AERS Inhalation Inhale 2 puffs into the lungs every 6 (six) hours as needed. For shortness of breath    . CALCIUM CARBONATE ANTACID 500 MG PO CHEW Oral Chew 1 tablet by mouth 6 (six) times daily. For heartburn    . DEXTROMETHORPHAN-GUAIFENESIN 20-400 MG PO TABS Oral Take 1 tablet by mouth at bedtime. For two days    . DULOXETINE HCL 60 MG PO CPEP Oral Take 60 mg by mouth every evening.     . MOMETASONE FUROATE 50 MCG/ACT NA SUSP Nasal Place 2 sprays into the nose daily.    . ADULT MULTIVITAMIN W/MINERALS CH Oral Take 1 tablet by mouth daily.    Marland Kitchen  AZO-STANDARD PO Oral Take 1 tablet by mouth as needed. For pain relief    . SIMVASTATIN 20 MG PO TABS Oral Take 20 mg by mouth every evening.    . TRAZODONE HCL 100 MG PO TABS Oral Take 100 mg by mouth at bedtime.      BP 143/73  Pulse 71  Temp 98 F (36.7 C) (Oral)  Resp 18  Ht 5\' 2"  (1.575 m)  Wt 153 lb (69.4 kg)  BMI 27.98 kg/m2  SpO2 100%  Physical Exam  Nursing note and vitals reviewed. Constitutional: She is oriented to person, place, and time. She appears well-developed and well-nourished. No distress.  HENT:  Head: Normocephalic and atraumatic.  Eyes: Conjunctivae and EOM are normal.       No nystagmus.   Neck: Neck supple. No tracheal deviation present.  Cardiovascular: Normal rate.    Pulmonary/Chest: Effort normal. No respiratory distress.  Abdominal: She exhibits no distension.       Mild epigastric tenderness  Musculoskeletal: Normal range of motion. She exhibits no tenderness.       Equal grip strength. +2 dp/pt pulses bilaterally. Great toe extension bilaterally. No lower back tenderness.  Neurological: She is alert and oriented to person, place, and time. No cranial nerve deficit or sensory deficit.       No ataxia with finger to nose. Pinprick sensation intact throughout.  Skin: Skin is dry.  Psychiatric: She has a normal mood and affect. Her behavior is normal.    ED Course  Procedures (including critical care time) DIAGNOSTIC STUDIES: Oxygen Saturation is 100% on room air, normal by my interpretation.    COORDINATION OF CARE: 20:22: Performs recheck. Patient says she is having abd discomfort that goes into esophagus. She says and she is experiencing feelings with the need to belge following the GI cocktail. Noted to patient that labs were nml and provided intent to d/c shortly after continued monitor with medications and ordered additional blood test.  21:14: Notified pt that extra blood work looks nml, CT nml, and provided neurologist to have f/u. Labs Reviewed  COMPREHENSIVE METABOLIC PANEL - Abnormal; Notable for the following:    Glucose, Bld 115 (*)     Calcium 11.2 (*)     Total Bilirubin 0.1 (*)     All other components within normal limits  URINALYSIS, ROUTINE W REFLEX MICROSCOPIC - Abnormal; Notable for the following:    pH 8.5 (*)     All other components within normal limits  CARDIAC PANEL(CRET KIN+CKTOT+MB+TROPI)  CBC WITH DIFFERENTIAL  URINE RAPID DRUG SCREEN (HOSP PERFORMED)  D-DIMER, QUANTITATIVE   Dg Chest 1 View  06/18/2012  *RADIOLOGY REPORT*  Clinical Data: Nausea and vomiting.  Chest pain.  CHEST - 1 VIEW  Comparison: 11/12/2011.  Findings: Normal heart size with clear lung fields.  No bony abnormality. No visible effusion or  pneumothorax.  No change from priors.  IMPRESSION: Stable chest.  No active disease.   Original Report Authenticated By: Elsie Stain, M.D.    Ct Head Wo Contrast  06/18/2012  *RADIOLOGY REPORT*  Clinical Data: Back pain.  Bilateral numbness.  CT HEAD WITHOUT CONTRAST  Technique:  Contiguous axial images were obtained from the base of the skull through the vertex without contrast.  Comparison: None.  Findings: There is no evidence for acute infarction, intracranial hemorrhage, mass lesion, hydrocephalus, or extra-axial fluid. There is no atrophy or significant white matter disease.  The calvarium is intact.  Paranasal sinuses and mastoids are  clear. Negative orbits.  IMPRESSION: Negative cranial CT.  No acute or focal intracranial abnormality.   Original Report Authenticated By: Elsie Stain, M.D.      No diagnosis found.    MDM  Intermittent numbness of the upper and lower extremities for the past month. Today with numbness of her upper back associated with epigastric pain one episode of vomiting. Nonfocal neurological exam with normal motor and sensory function throughout.   D/w Dr. Margo Aye that imaging of spinal canal by CT would not be beneficial. Patient symptoms do not fit anatomical distribution. Patient has had previous workup with MRI for upper extremity numbness. She has no weakness on exam no apparent sensory deficit. Will have follow up with neurology as outpatient.   Date: 06/18/2012  Rate: 49  Rhythm: sinus bradycardia  QRS Axis: normal  Intervals: normal  ST/T Wave abnormalities: normal  Conduction Disutrbances:none  Narrative Interpretation:   Old EKG Reviewed: unchanged   I personally performed the services described in this documentation, which was scribed in my presence.  The recorded information has been reviewed and considered.         Glynn Octave, MD 06/20/12 1140

## 2012-07-29 ENCOUNTER — Encounter (HOSPITAL_COMMUNITY): Payer: Self-pay | Admitting: *Deleted

## 2012-07-29 ENCOUNTER — Emergency Department (HOSPITAL_COMMUNITY)
Admission: EM | Admit: 2012-07-29 | Discharge: 2012-07-29 | Disposition: A | Discharge status: Home / Self Care | Payer: Self-pay | Attending: Emergency Medicine | Admitting: Emergency Medicine

## 2012-07-29 DIAGNOSIS — E7800 Pure hypercholesterolemia, unspecified: Secondary | ICD-10-CM | POA: Insufficient documentation

## 2012-07-29 DIAGNOSIS — M129 Arthropathy, unspecified: Secondary | ICD-10-CM | POA: Insufficient documentation

## 2012-07-29 DIAGNOSIS — R109 Unspecified abdominal pain: Secondary | ICD-10-CM | POA: Insufficient documentation

## 2012-07-29 DIAGNOSIS — K589 Irritable bowel syndrome without diarrhea: Secondary | ICD-10-CM | POA: Insufficient documentation

## 2012-07-29 DIAGNOSIS — M810 Age-related osteoporosis without current pathological fracture: Secondary | ICD-10-CM | POA: Insufficient documentation

## 2012-07-29 LAB — URINALYSIS, ROUTINE W REFLEX MICROSCOPIC
Bilirubin Urine: NEGATIVE
Hgb urine dipstick: NEGATIVE
Ketones, ur: NEGATIVE mg/dL
Protein, ur: NEGATIVE mg/dL
Urobilinogen, UA: 0.2 mg/dL (ref 0.0–1.0)

## 2012-07-29 LAB — COMPREHENSIVE METABOLIC PANEL
ALT: 21 U/L (ref 0–35)
CO2: 29 mEq/L (ref 19–32)
Calcium: 10.4 mg/dL (ref 8.4–10.5)
Chloride: 100 mEq/L (ref 96–112)
GFR calc Af Amer: >90 mL/min (ref >90)
GFR calc non Af Amer: >90 mL/min (ref >90)
Glucose, Bld: 86 mg/dL (ref 70–99)
Sodium: 139 mEq/L (ref 135–145)
Total Bilirubin: 0.2 mg/dL — ABNORMAL LOW (ref 0.3–1.2)

## 2012-07-29 LAB — CBC WITH DIFFERENTIAL/PLATELET
Eosinophils Relative: 2 % (ref 0–5)
HCT: 38.5 % (ref 36.0–46.0)
Lymphocytes Relative: 50 % — ABNORMAL HIGH (ref 12–46)
Lymphs Abs: 3.5 10*3/uL (ref 0.7–4.0)
MCV: 89.3 fL (ref 78.0–100.0)
Monocytes Absolute: 0.4 10*3/uL (ref 0.1–1.0)
Neutro Abs: 3 10*3/uL (ref 1.7–7.7)
Platelets: 352 10*3/uL (ref 150–400)
RBC: 4.31 MIL/uL (ref 3.87–5.11)
WBC: 7.1 10*3/uL (ref 4.0–10.5)

## 2012-07-29 MED ORDER — SODIUM CHLORIDE 0.9 % IV BOLUS (SEPSIS): 1000.0000 mL | Freq: Once | INTRAVENOUS | Status: DC

## 2012-07-29 MED ORDER — OMEPRAZOLE 20 MG PO CPDR: 20.0000 mg | DELAYED_RELEASE_CAPSULE | Freq: Two times a day (BID) | ORAL | Status: DC

## 2012-07-29 MED ORDER — ONDANSETRON HCL 4 MG/2ML IJ SOLN
4.0000 mg | Freq: Once | INTRAMUSCULAR | Status: AC
  Administered 2012-07-29: 4 mg via INTRAVENOUS
  Filled 2012-07-29: qty 2

## 2012-07-29 MED ORDER — SODIUM CHLORIDE 0.9 % IV BOLUS (SEPSIS)
1000.0000 mL | Freq: Once | INTRAVENOUS | Status: AC
  Administered 2012-07-29: 1000 mL via INTRAVENOUS

## 2012-07-29 MED ORDER — MORPHINE SULFATE 4 MG/ML IJ SOLN
4.0000 mg | Freq: Once | INTRAMUSCULAR | Status: AC
  Administered 2012-07-29: 4 mg via INTRAVENOUS
  Filled 2012-07-29: qty 1

## 2012-07-29 MED ORDER — OXYCODONE-ACETAMINOPHEN 5-325 MG PO TABS: 1.0000 | ORAL_TABLET | Freq: Four times a day (QID) | ORAL | Status: DC | PRN

## 2012-07-29 MED ORDER — HYDROCODONE-ACETAMINOPHEN 5-500 MG PO TABS: 1.0000 | ORAL_TABLET | Freq: Four times a day (QID) | ORAL | Status: DC | PRN

## 2012-07-29 NOTE — ED Notes (Signed)
Upper abd pain, vomiting, dizzy,no diarrhea, ?fever.

## 2012-07-29 NOTE — ED Provider Notes (Signed)
History   This chart was scribed for Geoffery Lyons, MD by Toya Smothers. The patient was seen in room APA03/APA03. Patient's care was started at 1226.  CSN: [id number]  Arrival date & time 07/29/12  1226   First MD Initiated Contact with Patient 07/29/12 1615      Chief Complaint  Patient presents with  . Abdominal Pain   Patient is a 56 y.o. female presenting with abdominal pain. The history is provided by the patient. No language interpreter was used.  Abdominal Pain The primary symptoms of the illness include abdominal pain, nausea and vomiting. The primary symptoms of the illness do not include diarrhea. The current episode started more than 2 days ago. The onset of the illness was gradual. The problem has been gradually worsening.  The abdominal pain began more than 2 days ago. The pain came on gradually. The abdominal pain has been gradually worsening since its onset. The abdominal pain is located in the epigastric region. The abdominal pain does not radiate. The abdominal pain is relieved by nothing. The abdominal pain is exacerbated by certain positions.  Nausea began 6 to 7 days ago.  The vomiting began more than 2 days ago. Vomiting occurred once. The emesis contains stomach contents.  The patient states that she believes she is currently not pregnant. The patient has not had a change in bowel habit. Risk factors for an acute abdominal problem include a history of abdominal surgery (IBS). Symptoms associated with the illness do not include constipation. Significant associated medical issues do not include sickle cell disease.    Destiny Young is a 57 y.o. female who presents to the Emergency Department complaining of 1 week new gradual onset sever constant epigastrica abdominal pain with associated nausea, mild water retention, and burning emesis (multiple episodes).  Abdominal pain is described as sharp and unlike any before. Pt also c/o mild intermittent dizziness while nauseated,  worsened by activity and alleviated with rest. Prior to arrival symptoms have been treated with Nexium providing no relief. She denies diarrhea, constipation, blood in stool, change in diet, antacid use, chest pain, and fever. Pt lists a h/o hernias, abdominal bloating, and IBS.   Past Medical History  Diagnosis Date  . High cholesterol   . Arthritis   . Hernia   . Abdominal bloating   . Globus hystericus   . IBS (irritable bowel syndrome)   . Hemorrhoids   . Arthritis   . Osteoporosis   . Anxiety   . TMJ (dislocation of temporomandibular joint)     Past Surgical History  Procedure Date  . Abdominal hysterectomy   . Shoulder surgery   . Hernia repair   . Tonsillectomy   . Nose surgery   . External ear surgery   . Abdominal surgery   . "sluggish gallbladder     Family History  Problem Relation Age of Onset  . Lung cancer Father   . Irritable bowel syndrome Sister   . Irritable bowel syndrome Sister     History  Substance Use Topics  . Smoking status: Never Smoker   . Smokeless tobacco: Not on file  . Alcohol Use: No    OB History    Grav Para Term Preterm Abortions TAB SAB Ect Mult Living   4 3   1  1   3       Review of Systems  Gastrointestinal: Positive for nausea, vomiting and abdominal pain. Negative for diarrhea and constipation.  Neurological: Positive for dizziness.  All other systems reviewed and are negative.    Allergies  Codeine; Meperidine hcl; Metaxalone; Naproxen; Other; Sulfonamide derivatives; Tramadol; and Ciprofloxacin  Home Medications   Current Outpatient Rx  Name Route Sig Dispense Refill  . ALBUTEROL SULFATE HFA 108 (90 BASE) MCG/ACT IN AERS Inhalation Inhale 2 puffs into the lungs every 6 (six) hours as needed. For shortness of breath    . FLUTICASONE PROPIONATE 50 MCG/ACT NA SUSP Nasal Place 2 sprays into the nose daily as needed. For allergies    . ADVIL PM PO Oral Take 1 tablet by mouth every 6 (six) hours as needed. For  pain/sleep    . ADULT MULTIVITAMIN W/MINERALS CH Oral Take 1 tablet by mouth daily.    . TRAMADOL HCL 50 MG PO TABS Oral Take 50 mg by mouth every 6 (six) hours as needed. For pain      BP 145/85  Pulse 77  Temp 98 F (36.7 C) (Oral)  Resp 18  Ht 5\' 2"  (1.575 m)  Wt 150 lb (68.04 kg)  BMI 27.44 kg/m2  SpO2 99%  Physical Exam  Constitutional: She is oriented to person, place, and time. She appears well-developed and well-nourished. No distress.  HENT:  Head: Normocephalic and atraumatic.  Eyes: EOM are normal. Pupils are equal, round, and reactive to light.  Neck: Normal range of motion. No tracheal deviation present.  Cardiovascular: Normal rate and regular rhythm.   No murmur heard. Pulmonary/Chest: Effort normal. No respiratory distress. She has no wheezes.  Abdominal: Bowel sounds are normal.       Mild epigastric tenderness to palpation.  Neurological: She is alert and oriented to person, place, and time.  Skin: Skin is warm. No rash noted.    ED Course  Procedures (including critical care time) DIAGNOSTIC STUDIES: Oxygen Saturation is 100% on room air, normal by my interpretation.    COORDINATION OF CARE: 15:37- Ordered CBC with Differential STAT  15:37- Ordered Comprehensive metabolic panel STAT  16:11- Ordered Lipase, blood Once  16:24- Evaluated Pt. Pt is w/o distress, awake, and alert. 16:30- Ordered Urinalysis, Routine w reflex microscopic Once.   Labs Reviewed  CBC WITH DIFFERENTIAL - Abnormal; Notable for the following:    Neutrophils Relative 42 (*)     Lymphocytes Relative 50 (*)     All other components within normal limits  COMPREHENSIVE METABOLIC PANEL - Abnormal; Notable for the following:    Total Bilirubin 0.2 (*)     All other components within normal limits  LIPASE, BLOOD  URINALYSIS, ROUTINE W REFLEX MICROSCOPIC   No results found.   No diagnosis found.    MDM  The patient presents here with a flareup of abd pain.  She has had  similar episodes in the past due to a "sluggish gallbladder".  She says she has not had follow up due to her being uninsured.   Her exam is reassuring as are the labs.  She is feeling better with pain meds and fluids.  I see no indication for a ct scan or further workup at this time.  She will be discharged with prilosec and percocet.  To return prn if she worsens.      I personally performed the services described in this documentation, which was scribed in my presence. The recorded information has been reviewed and considered.      Geoffery Lyons, MD 07/29/12 [id number]

## 2012-09-16 ENCOUNTER — Encounter: Payer: Self-pay | Admitting: Cardiology

## 2012-09-16 ENCOUNTER — Ambulatory Visit (INDEPENDENT_AMBULATORY_CARE_PROVIDER_SITE_OTHER): Payer: Self-pay | Admitting: Cardiology

## 2012-09-16 VITALS — BP 132/72 | HR 68 | Ht 62.0 in | Wt 156.1 lb

## 2012-09-16 DIAGNOSIS — R0609 Other forms of dyspnea: Secondary | ICD-10-CM

## 2012-09-16 DIAGNOSIS — E7800 Pure hypercholesterolemia, unspecified: Secondary | ICD-10-CM

## 2012-09-16 DIAGNOSIS — R0600 Dyspnea, unspecified: Secondary | ICD-10-CM

## 2012-09-16 DIAGNOSIS — R079 Chest pain, unspecified: Secondary | ICD-10-CM

## 2012-09-16 DIAGNOSIS — R0789 Other chest pain: Secondary | ICD-10-CM

## 2012-09-16 NOTE — Assessment & Plan Note (Signed)
Symptoms reviewed above. Patient seems to be most concerned about her intermittent right-sided neck and chest discomfort, perhaps some relation to emotional upset, although she also has dyspnea on exertion by report. Baseline ECG is normal. She does report history of hyperlipidemia, although details are not clear. States she has a sister in her 35s with peripheral arterial disease. No active tobacco use. We will do basic ischemic evaluation with a GXT. We will inform her of the results. Either way, I have encouraged her to find a primary care physician so she can have more comprehensive health evaluation and followup, perhaps reduce her frequent ER visits.

## 2012-09-16 NOTE — Assessment & Plan Note (Signed)
Details not clear. States that she previously took simvastatin but was not able to tolerate it. Again, I have encouraged her to find a primary care provider to have followup assessment and determine whether medical therapy is necessary beyond diet and exercise.

## 2012-09-16 NOTE — Patient Instructions (Addendum)
Your physician recommends that you schedule a follow-up appointment in: AS NEEDED  Your physician has requested that you have an exercise tolerance test. For further information please visit [email or web address] Please also follow instruction sheet, as given.  WE WILL CALL YOU WITH YOUR RESULTS

## 2012-09-16 NOTE — Progress Notes (Signed)
Clinical Summary Destiny Young is a 56 y.o.female referred for cardiology consultation. As best I can ascertain, she is referred by Dr. Bebe Shaggy following an ER visit back in January of this year with multiple symptoms including dizziness and chest burning. More recent encounter in the ER was in October with abdominal discomfort. At the present time she does not have a primary care provider.  She presents today describing several symptoms including occasional right-sided neck pain with emotional stress, dyspnea on exertion ranging from NYHA class II to class III, occasional palpitations, back pain, abdominal pain, leg pain. Also describes a more vague left-sided chest discomfort that occurs sporadically.  Record review finds a low risk Cardiolite from 2002 demonstrating no evidence of ischemia with LVEF 72%. ECG today shows sinus bradycardia at 58.  She reports a history of elevated cholesterol, states that she was previously on simvastatin but was unable to take it due to leg pain. Details of her actual lipid numbers are not clear.  Allergies  Allergen Reactions  . Codeine Nausea And Vomiting  . Meperidine Hcl   . Metaxalone   . Naproxen   . Other     GI COCKTAIL  . Sulfonamide Derivatives     Also allergic to gi cocktail.  . Tramadol   . Ciprofloxacin Rash    Current Outpatient Prescriptions  Medication Sig Dispense Refill  . albuterol (PROVENTIL HFA;VENTOLIN HFA) 108 (90 BASE) MCG/ACT inhaler Inhale 2 puffs into the lungs every 6 (six) hours as needed. For shortness of breath      . Cyanocobalamin (B-12) 3000 MCG SUBL Place 3,000 mg under the tongue daily.      . fluticasone (FLONASE) 50 MCG/ACT nasal spray Place 2 sprays into the nose daily as needed. For allergies      . ibuprofen (ADVIL,MOTRIN) 800 MG tablet Take 800 mg by mouth as needed.      . Ibuprofen-Diphenhydramine Cit (ADVIL PM PO) Take 1 tablet by mouth every 6 (six) hours as needed. For pain/sleep      . Multiple  Vitamin (MULTIVITAMIN WITH MINERALS) TABS Take 1 tablet by mouth daily.      Marland Kitchen omeprazole (PRILOSEC) 20 MG capsule Take 1 capsule (20 mg total) by mouth 2 (two) times daily.  30 capsule  1    Past Medical History  Diagnosis Date  . High cholesterol   . GERD (gastroesophageal reflux disease)     History of Nissen fundoplication  . IBS (irritable bowel syndrome)   . Hemorrhoids   . Arthritis   . Osteoporosis   . Anxiety   . TMJ (dislocation of temporomandibular joint)     Past Surgical History  Procedure Date  . Abdominal hysterectomy   . Shoulder surgery   . Hernia repair   . Tonsillectomy   . Nose surgery   . External ear surgery   . Abdominal surgery     Family History  Problem Relation Age of Onset  . Lung cancer Father   . Irritable bowel syndrome Sister   . Irritable bowel syndrome Sister   . Vascular Disease Sister     Social History Ms. Haydon reports that she has never smoked. She does not have any smokeless tobacco history on file. Ms. Boutell reports that she does not drink alcohol.  Review of Systems Also describes occasional headaches, difficulty sleeping, stable appetite. No active bleeding issues. No syncope. Otherwise negative.  Physical Examination Filed Vitals:   09/16/12 1350  BP: 132/72  Pulse: 68  Filed Weights   09/16/12 1350  Weight: 156 lb 1.9 oz (70.816 kg)   Patient in no acute distress. HEENT: Conjunctiva and lids normal, oropharynx clear. Neck: Supple, no elevated JVP or carotid bruits, no thyromegaly. Lungs: Clear to auscultation, nonlabored breathing at rest. Cardiac: Regular rate and rhythm, no S3 or significant systolic murmur, no pericardial rub. Abdomen: Soft, nontender, bowel sounds present. Extremities: No pitting edema, distal pulses 2+. Skin: Warm and dry. Musculoskeletal: No kyphosis. Neuropsychiatric: Alert and oriented x3, affect grossly appropriate.   Problem List and Plan   Atypical chest pain Symptoms  reviewed above. Patient seems to be most concerned about her intermittent right-sided neck and chest discomfort, perhaps some relation to emotional upset, although she also has dyspnea on exertion by report. Baseline ECG is normal. She does report history of hyperlipidemia, although details are not clear. States she has a sister in her 35s with peripheral arterial disease. No active tobacco use. We will do basic ischemic evaluation with a GXT. We will inform her of the results. Either way, I have encouraged her to find a primary care physician so she can have more comprehensive health evaluation and followup, perhaps reduce her frequent ER visits.  Elevated cholesterol Details not clear. States that she previously took simvastatin but was not able to tolerate it. Again, I have encouraged her to find a primary care provider to have followup assessment and determine whether medical therapy is necessary beyond diet and exercise.    Jonelle Sidle, M.D., F.A.C.C.

## 2012-10-04 ENCOUNTER — Inpatient Hospital Stay (HOSPITAL_COMMUNITY): Admission: RE | Admit: 2012-10-04 | Payer: Self-pay | Source: Ambulatory Visit

## 2012-10-13 ENCOUNTER — Encounter (HOSPITAL_COMMUNITY): Payer: Self-pay | Admitting: Cardiology

## 2012-10-13 ENCOUNTER — Encounter: Payer: Self-pay | Admitting: Cardiology

## 2012-10-13 ENCOUNTER — Ambulatory Visit (HOSPITAL_COMMUNITY)
Admission: RE | Admit: 2012-10-13 | Discharge: 2012-10-13 | Disposition: A | Discharge status: Home / Self Care | Payer: Self-pay | Source: Ambulatory Visit | Attending: Cardiology | Admitting: Cardiology

## 2012-10-13 DIAGNOSIS — R0989 Other specified symptoms and signs involving the circulatory and respiratory systems: Secondary | ICD-10-CM | POA: Insufficient documentation

## 2012-10-13 DIAGNOSIS — R0609 Other forms of dyspnea: Secondary | ICD-10-CM | POA: Insufficient documentation

## 2012-10-13 DIAGNOSIS — R0789 Other chest pain: Secondary | ICD-10-CM | POA: Insufficient documentation

## 2012-10-13 DIAGNOSIS — R079 Chest pain, unspecified: Secondary | ICD-10-CM

## 2012-10-13 NOTE — Progress Notes (Signed)
Stress Lab Nurses Notes - Destiny Young  Destiny Young 10/13/2012 Reason for doing test: Chest Pain and Dyspnea Type of test: Regular GTX Nurse performing test: Parke Poisson, RN Nuclear Medicine Tech: Not Applicable Echo Tech: Not Applicable MD performing test: Ival Bible & Joni Reining NP Family MD: Dr. Bebe Shaggy Test explained and consent signed: yes IV started: No IV started Symptoms: Fatigue & Mild SOB, some chest discomfort noted in recovery. Treatment/Intervention: None Reason test stopped: fatigue After recovery IV was: NA Patient to return to Nuc. Med at :NA Patient discharged: Home Patient's Condition upon discharge was: stable Comments: During test peak BP 206/71 & HR 139.  Recovery BP 148/82 & HR 84.  Symptoms resolved in recovery. [name]

## 2012-10-13 NOTE — Progress Notes (Signed)
[name], RN 10/13/2012 11:48 AM Signed   Stress Lab Nurses Notes - [name]   Destiny Young  10/13/2012  Reason for doing test: Chest Pain and Dyspnea  Type of test: Regular GTX  Nurse performing test: Parke Poisson, RN  Nuclear Medicine Tech: Not Applicable  Echo Tech: Not Applicable  MD performing test: Ival Bible & Joni Reining NP  Family MD: Dr. Bebe Shaggy  Test explained and consent signed: yes  IV started: No IV started  Symptoms: Fatigue & Mild SOB, some chest discomfort noted in recovery.  Treatment/Intervention: None  Reason test stopped: fatigue  After recovery IV was: NA  Patient to return to Nuc. Med at :NA  Patient discharged: Home  Patient's Condition upon discharge was: stable  Comments: During test peak BP 206/71 & HR 139. Recovery BP 148/82 & HR 84. Symptoms resolved in recovery.   [name]  Attending note:  Patient exercised on Bruce protocol for 5:45 minutes, 7 METS, peak heart rate 139 BPM representing 83% maximum age predicted heart rate. Peak blood pressure 206/71. No chest pain reported. No diagnostic ST abnormalities or arrhythmias. Negative for ischemia at 83% MPHR with hypertensive response.  Jonelle Sidle, M.D., F.A.C.C.

## 2012-10-14 ENCOUNTER — Telehealth: Payer: Self-pay | Admitting: *Deleted

## 2012-10-14 NOTE — Telephone Encounter (Signed)
While I agree that her blood pressure does need to be treated and followed, we should not be prescribing medications to her since she will not be following with [place] regularly. I would again recommend that she establish with a primary care provider. Options would include the local health department, free clinic in [place] as already noted. These clinics do not require insurance coverage.

## 2012-10-14 NOTE — Telephone Encounter (Signed)
Spoke to pt to advise results/instructions. Pt understood. Pt advised that she does not have insurance and an office visit to a PCP would be too expensive,advised free clinic in [place], pt noted she lives in [place]  Pt then asked if MD SM could possibly prescribe her a BP medication per the BP elevation as 206/71 during the recent test, please advise

## 2012-10-14 NOTE — Telephone Encounter (Signed)
Message copied by Ovidio Kin on Fri Oct 14, 2012  1:09 PM ------      Message from: MCDOWELL, Illene Bolus      Created: Thu Oct 13, 2012  4:28 PM       Please let patient know test was reassuring - no further cardiac workup planned at this point. She should establish with a primary MD as mentioned in my note.            ----- Message -----         From: Jonelle Sidle, MD         Sent: 10/13/2012   4:27 PM           To: Jonelle Sidle, MD

## 2012-10-17 NOTE — Telephone Encounter (Signed)
Called pt to advise MD advice, pt understood, number to [facility] given, this nurse stressed the importance to seek a PCP and that a majority of BP meds are on the cheaper list,  pt will call our office with any further needs concerning cardiac care and reiterated ED evaluation with any worsening cardiac s/s in the future, pt understood all instructions given

## 2012-12-21 ENCOUNTER — Emergency Department (HOSPITAL_COMMUNITY): Payer: Self-pay

## 2012-12-21 ENCOUNTER — Encounter (HOSPITAL_COMMUNITY): Payer: Self-pay

## 2012-12-21 ENCOUNTER — Emergency Department (HOSPITAL_COMMUNITY)
Admission: EM | Admit: 2012-12-21 | Discharge: 2012-12-21 | Disposition: A | Discharge status: Home / Self Care | Payer: Self-pay | Attending: Emergency Medicine | Admitting: Emergency Medicine

## 2012-12-21 DIAGNOSIS — Z8659 Personal history of other mental and behavioral disorders: Secondary | ICD-10-CM | POA: Insufficient documentation

## 2012-12-21 DIAGNOSIS — Z90710 Acquired absence of both cervix and uterus: Secondary | ICD-10-CM | POA: Insufficient documentation

## 2012-12-21 DIAGNOSIS — M129 Arthropathy, unspecified: Secondary | ICD-10-CM | POA: Insufficient documentation

## 2012-12-21 DIAGNOSIS — R070 Pain in throat: Secondary | ICD-10-CM | POA: Insufficient documentation

## 2012-12-21 DIAGNOSIS — Z79899 Other long term (current) drug therapy: Secondary | ICD-10-CM | POA: Insufficient documentation

## 2012-12-21 DIAGNOSIS — Z8679 Personal history of other diseases of the circulatory system: Secondary | ICD-10-CM | POA: Insufficient documentation

## 2012-12-21 DIAGNOSIS — R109 Unspecified abdominal pain: Secondary | ICD-10-CM | POA: Insufficient documentation

## 2012-12-21 DIAGNOSIS — E7800 Pure hypercholesterolemia, unspecified: Secondary | ICD-10-CM | POA: Insufficient documentation

## 2012-12-21 DIAGNOSIS — R350 Frequency of micturition: Secondary | ICD-10-CM | POA: Insufficient documentation

## 2012-12-21 DIAGNOSIS — Z8781 Personal history of (healed) traumatic fracture: Secondary | ICD-10-CM | POA: Insufficient documentation

## 2012-12-21 DIAGNOSIS — G8929 Other chronic pain: Secondary | ICD-10-CM

## 2012-12-21 DIAGNOSIS — Z8719 Personal history of other diseases of the digestive system: Secondary | ICD-10-CM | POA: Insufficient documentation

## 2012-12-21 DIAGNOSIS — R3915 Urgency of urination: Secondary | ICD-10-CM | POA: Insufficient documentation

## 2012-12-21 DIAGNOSIS — Z8739 Personal history of other diseases of the musculoskeletal system and connective tissue: Secondary | ICD-10-CM | POA: Insufficient documentation

## 2012-12-21 DIAGNOSIS — R110 Nausea: Secondary | ICD-10-CM | POA: Insufficient documentation

## 2012-12-21 DIAGNOSIS — K219 Gastro-esophageal reflux disease without esophagitis: Secondary | ICD-10-CM | POA: Insufficient documentation

## 2012-12-21 DIAGNOSIS — Z9889 Other specified postprocedural states: Secondary | ICD-10-CM | POA: Insufficient documentation

## 2012-12-21 LAB — URINALYSIS, ROUTINE W REFLEX MICROSCOPIC
Bilirubin Urine: NEGATIVE
Glucose, UA: NEGATIVE mg/dL
Hgb urine dipstick: NEGATIVE
Ketones, ur: NEGATIVE mg/dL
Leukocytes, UA: NEGATIVE
Nitrite: NEGATIVE
Protein, ur: NEGATIVE mg/dL
Specific Gravity, Urine: 1.01 (ref 1.005–1.030)
Urobilinogen, UA: 0.2 mg/dL (ref 0.0–1.0)
pH: 7 (ref 5.0–8.0)

## 2012-12-21 LAB — CBC WITH DIFFERENTIAL/PLATELET
Basophils Absolute: 0 10*3/uL (ref 0.0–0.1)
Basophils Relative: 0 % (ref 0–1)
Eosinophils Absolute: 0.1 K/uL (ref 0.0–0.7)
Eosinophils Relative: 1 % (ref 0–5)
HCT: 37.2 % (ref 36.0–46.0)
Hemoglobin: 12.5 g/dL (ref 12.0–15.0)
Lymphocytes Relative: 46 % (ref 12–46)
Lymphs Abs: 2.5 K/uL (ref 0.7–4.0)
MCH: 30.3 pg (ref 26.0–34.0)
MCHC: 33.6 g/dL (ref 30.0–36.0)
MCV: 90.1 fL (ref 78.0–100.0)
Monocytes Absolute: 0.4 K/uL (ref 0.1–1.0)
Monocytes Relative: 8 % (ref 3–12)
Neutro Abs: 2.5 10*3/uL (ref 1.7–7.7)
Neutrophils Relative %: 45 % (ref 43–77)
Platelets: 367 K/uL (ref 150–400)
RBC: 4.13 MIL/uL (ref 3.87–5.11)
RDW: 12.7 % (ref 11.5–15.5)
WBC: 5.5 K/uL (ref 4.0–10.5)

## 2012-12-21 LAB — BASIC METABOLIC PANEL
Chloride: 101 mEq/L (ref 96–112)
GFR calc Af Amer: 89 mL/min — ABNORMAL LOW (ref >90)
Potassium: 3.8 mEq/L (ref 3.5–5.1)

## 2012-12-21 LAB — HEPATIC FUNCTION PANEL
ALT: 15 U/L (ref 0–35)
AST: 19 U/L (ref 0–37)
Albumin: 4.2 g/dL (ref 3.5–5.2)
Alkaline Phosphatase: 86 U/L (ref 39–117)
Bilirubin, Direct: 0.1 mg/dL (ref 0.0–0.3)
Indirect Bilirubin: 0.1 mg/dL — ABNORMAL LOW (ref 0.3–0.9)
Total Bilirubin: 0.2 mg/dL — ABNORMAL LOW (ref 0.3–1.2)
Total Protein: 7.9 g/dL (ref 6.0–8.3)

## 2012-12-21 LAB — TROPONIN I: Troponin I: <0.3 ng/mL (ref <0.30)

## 2012-12-21 LAB — BASIC METABOLIC PANEL WITH GFR
BUN: 6 mg/dL (ref 6–23)
CO2: 28 meq/L (ref 19–32)
Calcium: 10 mg/dL (ref 8.4–10.5)
Creatinine, Ser: 0.84 mg/dL (ref 0.50–1.10)
GFR calc non Af Amer: 77 mL/min — ABNORMAL LOW (ref >90)
Glucose, Bld: 97 mg/dL (ref 70–99)
Sodium: 138 meq/L (ref 135–145)

## 2012-12-21 LAB — LIPASE, BLOOD: Lipase: 46 U/L (ref 11–59)

## 2012-12-21 MED ORDER — IOHEXOL 300 MG/ML  SOLN
50.0000 mL | Freq: Once | INTRAMUSCULAR | Status: AC | PRN
  Administered 2012-12-21: 50 mL via INTRAVENOUS

## 2012-12-21 MED ORDER — IOHEXOL 300 MG/ML  SOLN
100.0000 mL | Freq: Once | INTRAMUSCULAR | Status: AC | PRN
  Administered 2012-12-21: 100 mL via INTRAVENOUS

## 2012-12-21 MED ORDER — PANTOPRAZOLE SODIUM 40 MG IV SOLR
40.0000 mg | Freq: Once | INTRAVENOUS | Status: AC
  Administered 2012-12-21: 40 mg via INTRAVENOUS
  Filled 2012-12-21: qty 40

## 2012-12-21 MED ORDER — HYDROCODONE-ACETAMINOPHEN 5-325 MG PO TABS: ORAL_TABLET | ORAL | Status: DC

## 2012-12-21 MED ORDER — HYDROMORPHONE HCL PF 1 MG/ML IJ SOLN
0.5000 mg | INTRAMUSCULAR | Status: AC
  Administered 2012-12-21: 0.5 mg via INTRAVENOUS
  Filled 2012-12-21: qty 1

## 2012-12-21 NOTE — ED Notes (Signed)
Patient complaining of pain returning since brought back from CT. Patient refuses dilaudid medication. Patient discharged with prescription for pain medication.

## 2012-12-21 NOTE — ED Provider Notes (Signed)
History    This chart was scribed for Gavin Pound. Oletta Lamas, MD by Gerlean Ren, ED Scribe. This patient was seen in room APA01/APA01 and the patient's care was started at 3:46 PM    CSN: [id number]  Arrival date & time 12/21/12  1413   First MD Initiated Contact with Patient 12/21/12 1543      Chief Complaint  Patient presents with  . Abdominal Pain     The history is provided by the patient. No language interpreter was used.  Destiny Young is a 57 y.o. female who presents to the Emergency Department complaining of 2 months of epigastric and LUQ abdominal pain that is present after eating.  Pt reports that she felt sudden pain around area of ventral hernia when playing with child 10/18/2012 and noticed pain after meals 10-12 hours later.  This pain after eating has been constantly present and gradually worsening since that time.  Pt reports some associated throat discomfort similar to h/o GERD.  Pt has used Omeprazole and Tums with no relief.  Pt also reports urgent need to urinate every 2-3 hours while sleeping beginning with onset of abdominal pain.   Pt states that she has been unable to burp or vomit even though she may sometimes feel the need to do so.  Pt also complains of burning pain on bottom of bilateral feet new as of this week with some reported intermittent ankle swelling.  Pt most recently saw PCP 2 weeks ago and only finding in blood work was hypercholesteremia, for which she is currently on Cymbastatin .  Pt has Sulfa and Demerol allergies and states GI cocktails and "Aspiring-like medicines" make her nauseated.  Most recent surgery in 2001 by Dr. Franky Macho.   No current PCP.   Past Medical History  Diagnosis Date  . High cholesterol   . GERD (gastroesophageal reflux disease)     History of Nissen fundoplication  . IBS (irritable bowel syndrome)   . Hemorrhoids   . Arthritis   . Osteoporosis   . Anxiety   . TMJ (dislocation of temporomandibular joint)     Past Surgical  History  Procedure Laterality Date  . Abdominal hysterectomy    . Shoulder surgery    . Hernia repair    . Tonsillectomy    . Nose surgery    . External ear surgery    . Abdominal surgery      Family History  Problem Relation Age of Onset  . Lung cancer Father   . Irritable bowel syndrome Sister   . Irritable bowel syndrome Sister   . Vascular Disease Sister     History  Substance Use Topics  . Smoking status: Never Smoker   . Smokeless tobacco: Not on file  . Alcohol Use: No    OB History   Grav Para Term Preterm Abortions TAB SAB Ect Mult Living   4 3   1  1   3       Review of Systems  Gastrointestinal: Positive for nausea and abdominal pain. Negative for vomiting.  Genitourinary: Positive for urgency and frequency.  All other systems reviewed and are negative.    Allergies  Codeine; Meperidine hcl; Metaxalone; Naproxen; Other; Sulfonamide derivatives; Tramadol; and Ciprofloxacin  Home Medications   Current Outpatient Rx  Name  Route  Sig  Dispense  Refill  . albuterol (PROVENTIL HFA;VENTOLIN HFA) 108 (90 BASE) MCG/ACT inhaler   Inhalation   Inhale 2 puffs into the lungs every 6 (six)  hours as needed. For shortness of breath         . Multiple Vitamin (MULTIVITAMIN WITH MINERALS) TABS   Oral   Take 1 tablet by mouth daily.         Marland Kitchen omeprazole (PRILOSEC) 20 MG capsule   Oral   Take 1 capsule (20 mg total) by mouth 2 (two) times daily.   30 capsule   1   . simvastatin (ZOCOR) 20 MG tablet   Oral   Take 20 mg by mouth every evening.         . fluticasone (FLONASE) 50 MCG/ACT nasal spray   Nasal   Place 2 sprays into the nose daily as needed. For allergies         . HYDROcodone-acetaminophen (NORCO/VICODIN) 5-325 MG per tablet      1-2 tablets po q 6 hours prn moderate to severe pain   20 tablet   0     BP 138/76  Pulse 68  Temp(Src) 98 F (36.7 C)  Resp 18  Ht 5\' 2"  (1.575 m)  Wt 155 lb (70.308 kg)  BMI 28.34 kg/m2  SpO2  100%  Physical Exam  Nursing note and vitals reviewed. Constitutional: She is oriented to person, place, and time. She appears well-developed and well-nourished. No distress.  HENT:  Head: Normocephalic and atraumatic.  Eyes: EOM are normal.  Neck: Neck supple. No tracheal deviation present.  Cardiovascular: Normal rate.   Pulmonary/Chest: Effort normal and breath sounds normal. No respiratory distress. She has no wheezes. She has no rales.  Abdominal: Soft. Bowel sounds are normal. She exhibits no mass.  LUQ and epigastric tenderness  Musculoskeletal: Normal range of motion.  Neurological: She is alert and oriented to person, place, and time.  Skin: Skin is warm and dry.  Psychiatric: She has a normal mood and affect. Her behavior is normal.    ED Course  Procedures (including critical care time) DIAGNOSTIC STUDIES: Oxygen Saturation is 100% on room air, normal by my interpretation.    COORDINATION OF CARE: 4:02 PM- Discussed with pt that imaging and some lab work can be performed here to help rule out some diagnoses, buit that follow-up with gastroenterologist for further testing to help determine nature of symptoms.  Ordered troponin, lipase, hepatic function panel, urinalysis, CBC, b-met, and abdominal/pelvic CT with contrast.  Pt understands and agrees with plan.  Results for orders placed during the hospital encounter of 12/21/12  URINALYSIS, ROUTINE W REFLEX MICROSCOPIC      Result Value Range   Color, Urine YELLOW  YELLOW   APPearance CLEAR  CLEAR   Specific Gravity, Urine 1.010  1.005 - 1.030   pH 7.0  5.0 - 8.0   Glucose, UA NEGATIVE  NEGATIVE mg/dL   Hgb urine dipstick NEGATIVE  NEGATIVE   Bilirubin Urine NEGATIVE  NEGATIVE   Ketones, ur NEGATIVE  NEGATIVE mg/dL   Protein, ur NEGATIVE  NEGATIVE mg/dL   Urobilinogen, UA 0.2  0.0 - 1.0 mg/dL   Nitrite NEGATIVE  NEGATIVE   Leukocytes, UA NEGATIVE  NEGATIVE  CBC WITH DIFFERENTIAL      Result Value Range   WBC 5.5   4.0 - 10.5 K/uL   RBC 4.13  3.87 - 5.11 MIL/uL   Hemoglobin 12.5  12.0 - 15.0 g/dL   HCT 16.1  09.6 - 04.5 %   MCV 90.1  78.0 - 100.0 fL   MCH 30.3  26.0 - 34.0 pg   MCHC 33.6  30.0 -  36.0 g/dL   RDW 47.8  29.5 - 62.1 %   Platelets 367  150 - 400 K/uL   Neutrophils Relative 45  43 - 77 %   Neutro Abs 2.5  1.7 - 7.7 K/uL   Lymphocytes Relative 46  12 - 46 %   Lymphs Abs 2.5  0.7 - 4.0 K/uL   Monocytes Relative 8  3 - 12 %   Monocytes Absolute 0.4  0.1 - 1.0 K/uL   Eosinophils Relative 1  0 - 5 %   Eosinophils Absolute 0.1  0.0 - 0.7 K/uL   Basophils Relative 0  0 - 1 %   Basophils Absolute 0.0  0.0 - 0.1 K/uL  BASIC METABOLIC PANEL      Result Value Range   Sodium 138  135 - 145 mEq/L   Potassium 3.8  3.5 - 5.1 mEq/L   Chloride 101  96 - 112 mEq/L   CO2 28  19 - 32 mEq/L   Glucose, Bld 97  70 - 99 mg/dL   BUN 6  6 - 23 mg/dL   Creatinine, Ser 3.08  0.50 - 1.10 mg/dL   Calcium 65.7  8.4 - 84.6 mg/dL   GFR calc non Af Amer 77 (*) >90 mL/min   GFR calc Af Amer 89 (*) >90 mL/min  LIPASE, BLOOD      Result Value Range   Lipase 46  11 - 59 U/L  HEPATIC FUNCTION PANEL      Result Value Range   Total Protein 7.9  6.0 - 8.3 g/dL   Albumin 4.2  3.5 - 5.2 g/dL   AST 19  0 - 37 U/L   ALT 15  0 - 35 U/L   Alkaline Phosphatase 86  39 - 117 U/L   Total Bilirubin 0.2 (*) 0.3 - 1.2 mg/dL   Bilirubin, Direct 0.1  0.0 - 0.3 mg/dL   Indirect Bilirubin 0.1 (*) 0.3 - 0.9 mg/dL  TROPONIN I      Result Value Range   Troponin I <0.30  <0.30 ng/mL    Ct Abdomen Pelvis W Contrast  12/21/2012  *RADIOLOGY REPORT*  Clinical Data: Abdominal pain.  Left upper quadrant pain.  Nausea.  CT ABDOMEN AND PELVIS WITH CONTRAST  Technique:  Multidetector CT imaging of the abdomen and pelvis was performed following the standard protocol during bolus administration of intravenous contrast.  Contrast: 50mL OMNIPAQUE IOHEXOL 300 MG/ML  SOLN, OMNIPAQUE IOHEXOL 300 MG/ML  SOLN  Comparison: CT abdomen and  pelvis with contrast 03/01/2012.  Findings: A 4.5 mm nodule in the medial right lung base is stable. The previously seen peripheral nodule at the left lung base is not present on today's exam.  No new nodules are seen.  The heart size is normal.  No significant pleural or pericardial effusion is present.  There is mild fatty infiltration of the liver.  No discrete lesions are present.  The spleen is within normal limits.  Postsurgical changes are present at the GE junction without evidence for recurrent hernia.  The duodenum and pancreas are within normal limits.  The common bile duct and gallbladder are normal.  Adrenal glands are normal bilaterally.  The kidneys and ureters are within normal limits bilaterally.  Minimal atherosclerotic calcifications are present in the aorta branch vessels.  The distal and rectosigmoid colon are mostly collapsed.  The more proximal colon is unremarkable.  The appendix is not discretely visualized and may be surgically absent.  There are no  secondary signs of inflammation.  The small bowel is within normal limits. No significant adenopathy or free fluid is present.  Degenerative changes are present in the lower lumbar spine, particularly at L5- S1 bilaterally.  No focal lytic or blastic lesions are present.  IMPRESSION:  1.  No acute or focal abnormality to explain the patient's symptoms. 2.  Status post fundoplication without evidence for residual or recurrent hernia. 3.  Stable 4.5 mm nodule at the medial right lung base.   Original Report Authenticated By: Marin Roberts, M.D.      1. Chronic abdominal pain     EKG at time 16:01, shows normal sinus rhythm at a rate of 62, normal axis, intervals and no ST or T-wave abnormalities. EKG is faster compared to EKG from 06/18/2012. Interpretation is normal EKG.   7:05 PM CT abdomen and pelvis was performed and I reviewed personally the images. Her review the radiologist's interpretation and there is no evidence of any  acute process. There is a lung nodule the patient can followup with her PCP. There is evidence of fundoplication but no residual or recurrent hernia. Patient will be referred to GI for followup as an outpatient. I will give her a short supply of some analgesics here and reassurance. Given the chronic nature of her symptoms, normal labs, normal CT scan, I do not feel that there is any significant emergent condition at present. Edition her EKG was normal and her troponin x1 was normal as well.  MDM  I personally performed the services described in this documentation, which was scribed in my presence. The recorded information has been reviewed and considered.  Patient with now chronic left upper epigastric abdominal pain for 2+ months after reportedly overexerting himself playing with her grandchildren. She has significant history of previous surgery for reflux and then repeat surgery for a postsurgical hernia. The surgery was greater than 10 years ago. I suspect this may have something to do with her symptoms, either that or continued increasing reflux or possibly peptic ulcer disease. She denies fever chills, does not appear toxic in appearance. Patient has expectations of knowing why her pain is occurring as well as explanations for her bitter taste as well as increase in urination in the evenings. Patient is limited by the fact that she has no insurance nor a primary care physician. She has just begun seeing the medical clinic in [place] who recently put her on anticholesterol medication. She is also taking Prilosec but is not improving her symptoms. I informed her that we will obtain a abdominal CT scan to ensure that there is no tumor, ischemia which I highly doubt, or complications from her prior surgery. I suspect however that she will need continued outpatient evaluation, likely with a gastroenterologist tilt milligrams determine what is causing her symptoms. On examination she does not have a surgical  abdomen. Initial blood test shows completely normal CBC and basic metabolic panel. Some parenteral analgesics was provided here in emergency department with some pain relief.         Gavin Pound. Oletta Lamas, MD 12/21/12 1610

## 2012-12-21 NOTE — ED Notes (Signed)
Pt reports LUQ pain for the past couple of months.  Reports pain has been getting worse, unable to sleep last night.  Pt c/o heaviness in abd and feels like "everything is pushed up in my throat."  Denies vomiting.  Had blood work with PCP  2 weeks ago.  Reports the only thing the blood work showed was elevated cholesterol.  PT also c/o "bitter taste" in mouth.  Pt was started on cholesterol medication a few days ago.

## 2012-12-21 NOTE — Discharge Instructions (Signed)
Abdominal Pain  Abdominal pain can be caused by many things. Your caregiver decides the seriousness of your pain by an examination and possibly blood tests and X-rays. Many cases can be observed and treated at home. Most abdominal pain is not caused by a disease and will probably improve without treatment. However, in many cases, more time must pass before a clear cause of the pain can be found. Before that point, it may not be known if you need more testing, or if hospitalization or surgery is needed.  HOME CARE INSTRUCTIONS   · Do not take laxatives unless directed by your caregiver.  · Take pain medicine only as directed by your caregiver.  · Only take over-the-counter or prescription medicines for pain, discomfort, or fever as directed by your caregiver.  · Try a clear liquid diet (broth, tea, or water) for as long as directed by your caregiver. Slowly move to a bland diet as tolerated.  SEEK IMMEDIATE MEDICAL CARE IF:   · The pain does not go away.  · You have a fever.  · You keep throwing up (vomiting).  · The pain is felt only in portions of the abdomen. Pain in the right side could possibly be appendicitis. In an adult, pain in the left lower portion of the abdomen could be colitis or diverticulitis.  · You pass bloody or black tarry stools.  MAKE SURE YOU:   · Understand these instructions.  · Will watch your condition.  · Will get help right away if you are not doing well or get worse.  Document Released: 07/22/2005 Document Revised: 01/04/2012 Document Reviewed: 05/30/2008  [employer]® Patient Information ©2013 [employer].

## 2012-12-21 NOTE — ED Notes (Signed)
Hx of mid upper abd hernia - c/o LUQ pain x 2 months after picking up 74 mo old grandchild.  States pain has progressively gotten worse.  Worse with eating.  Describes pain as "pressure".  States puts pressure on bladder causing frequent urination.  Denies n/v/d.  Also states feels like pressure is "pushing up into my throat."  nad noted at this time.  abd soft, non-distended.

## 2013-01-19 ENCOUNTER — Encounter: Payer: Self-pay | Admitting: Internal Medicine

## 2013-01-20 ENCOUNTER — Encounter: Payer: Self-pay | Admitting: Gastroenterology

## 2013-01-20 ENCOUNTER — Other Ambulatory Visit: Payer: Self-pay

## 2013-01-20 ENCOUNTER — Telehealth: Payer: Self-pay | Admitting: Gastroenterology

## 2013-01-20 ENCOUNTER — Ambulatory Visit (INDEPENDENT_AMBULATORY_CARE_PROVIDER_SITE_OTHER): Payer: Self-pay | Admitting: Gastroenterology

## 2013-01-20 VITALS — BP 134/76 | HR 78 | Temp 98.4°F | Ht 62.0 in | Wt 155.6 lb

## 2013-01-20 DIAGNOSIS — R1013 Epigastric pain: Secondary | ICD-10-CM | POA: Insufficient documentation

## 2013-01-20 DIAGNOSIS — K219 Gastro-esophageal reflux disease without esophagitis: Secondary | ICD-10-CM

## 2013-01-20 DIAGNOSIS — R6881 Early satiety: Secondary | ICD-10-CM

## 2013-01-20 NOTE — Telephone Encounter (Signed)
PT CALLED STATING SHE WAS SEEN IN [facility] TODAY AND FORGOT TO ASK FOR PAIN MEDS. EXPLAINED TO PT WE DO NOT PRESCRIBE NARCOTICS OVER THE PHONE OR FOR CHRONIC PAIN ISSUES. HER EGD IS SCHEDULED FOR APR 10. LET HER KNOW I WOULD SEND A NOTE TO DR. Jena Gauss TO SEE IF HER PROCEDURE COULD BE DONE SOONER. RECOMMENDED TYLENOL PRN OR MAALOX/MYLANTA. PT VOICED UNDERSTANDING

## 2013-01-20 NOTE — Progress Notes (Signed)
Primary Care Physician: Margorie John, MD  Primary Gastroenterologist:  Roetta Sessions, MD   Chief Complaint  Patient presents with  . Abdominal Pain    HPI: Destiny Young is a 57 y.o. female here for f/u abdominal pain. C/O chronic upper abdominal pain and air trapping ever since Nissen fundoplication in 2000. Rarely without pain. Hard to sleep due to pain. Used to take Klonipin to help sleep.   Esophagus feels raw/lump in the throat.  Abdominal cramping.   C/O atypical left sided chest pain. She reports negative stress test recently.  Was told she had chest wall pain or pain related to Nissen. Epigastric cramping, early satiety with any meals. Bristol stool 5, 3-4 small stools per day. Constant pressure and urge to have BM. Rectocele. No melena, brbpr.   Rarely takes ibuprofen.  Current Outpatient Prescriptions  Medication Sig Dispense Refill  . albuterol (PROVENTIL HFA;VENTOLIN HFA) 108 (90 BASE) MCG/ACT inhaler Inhale 2 puffs into the lungs every 6 (six) hours as needed. For shortness of breath      . fluticasone (FLONASE) 50 MCG/ACT nasal spray Place 2 sprays into the nose daily as needed. For allergies      . Multiple Vitamin (MULTIVITAMIN WITH MINERALS) TABS Take 1 tablet by mouth daily.      Marland Kitchen omeprazole (PRILOSEC) 20 MG capsule Take 1 capsule (20 mg total) by mouth 2 (two) times daily.  30 capsule  1  . simvastatin (ZOCOR) 20 MG tablet Take 20 mg by mouth every evening.       No current facility-administered medications for this visit.    Allergies as of 01/20/2013 - Review Complete 01/20/2013  Allergen Reaction Noted  . Codeine Nausea And Vomiting   . Meperidine hcl    . Metaxalone    . Naproxen  07/29/2012  . Other  11/18/2011  . Sulfonamide derivatives    . Tramadol  07/29/2012  . Ciprofloxacin Rash    Past Medical History  Diagnosis Date  . High cholesterol   . GERD (gastroesophageal reflux disease)     History of Nissen fundoplication  . IBS (irritable  bowel syndrome)   . Hemorrhoids   . Arthritis   . Osteoporosis   . Anxiety   . TMJ (dislocation of temporomandibular joint)    Past Surgical History  Procedure Laterality Date  . Abdominal hysterectomy    . Shoulder surgery    . Hernia repair    . Tonsillectomy    . Nose surgery    . External ear surgery    . Abdominal surgery      hernia repair  . Esophagogastroduodenoscopy  04/14/2006    [id number] esophagus with esophagogastric junction at 36 cm from the incisors.  Intact Nissen fundoplication, otherwise normal  . Esophagogastroduodenoscopy  May 2011    Dr. Jena Gauss: normal, status post intact be sent to Dr. Loren Racer and, antral erosions and erythema with chronic gastritis but no H. pylori on biopsies.  . Colonoscopy  May 2011    Dr. Jena Gauss: Normal terminal ileum to 10 cm, normal colonoscopy.  . Cervical ablation      ROS:  General: Negative for anorexia, weight loss, fever, chills, fatigue, weakness. ENT: Negative for hoarseness, difficulty swallowing , nasal congestion. CV: Negative for chest pain, angina, palpitations, dyspnea on exertion, peripheral edema. See hpi Respiratory: Negative for dyspnea at rest, dyspnea on exertion, cough, sputum, wheezing.  GI: See history of present illness. GU:  Negative for dysuria, hematuria, urinary incontinence, urinary frequency, nocturnal  urination.  Endo: Negative for unusual weight change.    Physical Examination:   BP 134/76  Pulse 78  Temp(Src) 98.4 F (36.9 C) (Oral)  Ht 5\' 2"  (1.575 m)  Wt 155 lb 9.6 oz (70.58 kg)  BMI 28.45 kg/m2  General: Well-nourished, well-developed in no acute distress.  Eyes: No icterus. Mouth: Oropharyngeal mucosa moist and pink , no lesions erythema or exudate. Lungs: Clear to auscultation bilaterally.  Heart: Regular rate and rhythm, no murmurs rubs or gallops.  Abdomen: Bowel sounds are normal, moderate epigastric pain, nondistended, no hepatosplenomegaly or masses, no abdominal bruits  or hernia , no rebound or guarding.   Extremities: No lower extremity edema. No clubbing or deformities. Neuro: Alert and oriented x 4   Skin: Warm and dry, no jaundice.   Psych: Alert and cooperative, normal mood and affect.  Labs:  Lab Results  Component Value Date   WBC 5.5 12/21/2012   HGB 12.5 12/21/2012   HCT 37.2 12/21/2012   MCV 90.1 12/21/2012   PLT 367 12/21/2012   Lab Results  Component Value Date   CREATININE 0.84 12/21/2012   BUN 6 12/21/2012   NA 138 12/21/2012   K 3.8 12/21/2012   CL 101 12/21/2012   CO2 28 12/21/2012   Lab Results  Component Value Date   LIPASE 46 12/21/2012   Lab Results  Component Value Date   ALT 15 12/21/2012   AST 19 12/21/2012   ALKPHOS 86 12/21/2012   BILITOT 0.2* 12/21/2012    Imaging Studies: Ct Abdomen Pelvis W Contrast  12/21/2012  *RADIOLOGY REPORT*  Clinical Data: Abdominal pain.  Left upper quadrant pain.  Nausea.  CT ABDOMEN AND PELVIS WITH CONTRAST  Technique:  Multidetector CT imaging of the abdomen and pelvis was performed following the standard protocol during bolus administration of intravenous contrast.  Contrast: 50mL OMNIPAQUE IOHEXOL 300 MG/ML  SOLN, OMNIPAQUE IOHEXOL 300 MG/ML  SOLN  Comparison: CT abdomen and pelvis with contrast 03/01/2012.  Findings: A 4.5 mm nodule in the medial right lung base is stable. The previously seen peripheral nodule at the left lung base is not present on today's exam.  No new nodules are seen.  The heart size is normal.  No significant pleural or pericardial effusion is present.  There is mild fatty infiltration of the liver.  No discrete lesions are present.  The spleen is within normal limits.  Postsurgical changes are present at the GE junction without evidence for recurrent hernia.  The duodenum and pancreas are within normal limits.  The common bile duct and gallbladder are normal.  Adrenal glands are normal bilaterally.  The kidneys and ureters are within normal limits bilaterally.  Minimal  atherosclerotic calcifications are present in the aorta branch vessels.  The distal and rectosigmoid colon are mostly collapsed.  The more proximal colon is unremarkable.  The appendix is not discretely visualized and may be surgically absent.  There are no secondary signs of inflammation.  The small bowel is within normal limits. No significant adenopathy or free fluid is present.  Degenerative changes are present in the lower lumbar spine, particularly at L5- S1 bilaterally.  No focal lytic or blastic lesions are present.  IMPRESSION:  1.  No acute or focal abnormality to explain the patient's symptoms. 2.  Status post fundoplication without evidence for residual or recurrent hernia. 3.  Stable 4.5 mm nodule at the medial right lung base.   Original Report Authenticated By: Marin Roberts, M.D.

## 2013-01-20 NOTE — Patient Instructions (Addendum)
We have scheduled you for an upper endoscopy with Dr. Rourk. Please see separate instructions.  

## 2013-01-21 ENCOUNTER — Encounter: Payer: Self-pay | Admitting: Gastroenterology

## 2013-01-21 NOTE — Assessment & Plan Note (Signed)
Epigastric pain, early satiety, GERD with prior Nissen fundoplication in 2000. Patient reports postprandial symptoms but also with pain unrelated to meals. Some symptoms are positional suggesting musculoskeletal component. 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   Patient reports GI symptoms severe since her Nissen fundoplication. Wants to consider having wrap taken down.   First , EGD in the near future.  I have discussed the risks, alternatives, benefits with regards to but not limited to the risk of reaction to medication, bleeding, infection, perforation and the patient is agreeable to proceed. Written consent to be obtained. Phenergan 25mg  IV 30 mins before the procedure. PATIENT IS ALLERGIC TO DEMEROL.

## 2013-01-23 ENCOUNTER — Encounter (HOSPITAL_COMMUNITY): Payer: Self-pay | Admitting: Pharmacy Technician

## 2013-01-23 NOTE — Progress Notes (Signed)
CC PCP 

## 2013-01-23 NOTE — Telephone Encounter (Signed)
I called pt and she said she is feeling better and she will wait until 02/02/2013 as planned.

## 2013-01-23 NOTE — Telephone Encounter (Signed)
Saw this note from [facility]. Can her procedure be done sooner?

## 2013-01-23 NOTE — Telephone Encounter (Signed)
Forwarding to [place] whom scheduled procedure to begin with

## 2013-02-01 ENCOUNTER — Telehealth: Payer: Self-pay

## 2013-02-01 NOTE — Telephone Encounter (Signed)
Pt called to cancel appt for tomorrow for EGD. Will be getting the new insurance the first of May. Will try to schedule after 1st of May but definitely not on the 3rd.

## 2013-02-01 NOTE — Telephone Encounter (Signed)
Pt's ov was on 01/20/2013 so in order to reschedule EGD after the first of May, she needs to have another OV and she is scheduled for 02/27/2013 at 11:30 AM with Tana Coast, PA.

## 2013-02-02 ENCOUNTER — Encounter (HOSPITAL_COMMUNITY): Admission: RE | Payer: Self-pay | Source: Ambulatory Visit

## 2013-02-02 ENCOUNTER — Ambulatory Visit (HOSPITAL_COMMUNITY): Admission: RE | Admit: 2013-02-02 | Payer: Self-pay | Source: Ambulatory Visit | Admitting: Internal Medicine

## 2013-02-02 SURGERY — EGD (ESOPHAGOGASTRODUODENOSCOPY)
Anesthesia: Moderate Sedation

## 2013-02-13 NOTE — Telephone Encounter (Signed)
LMOM for the pt the appt date and time and if questions to call.

## 2013-02-27 ENCOUNTER — Ambulatory Visit: Payer: Self-pay | Admitting: Gastroenterology

## 2013-06-20 ENCOUNTER — Encounter: Payer: Self-pay | Admitting: Gastroenterology

## 2013-06-20 ENCOUNTER — Ambulatory Visit: Payer: Self-pay | Admitting: Gastroenterology

## 2013-06-20 ENCOUNTER — Telehealth: Payer: Self-pay | Admitting: Gastroenterology

## 2013-06-20 NOTE — Telephone Encounter (Signed)
Pt was a no show

## 2013-06-20 NOTE — Telephone Encounter (Signed)
Needs letter for f/u.  

## 2013-06-20 NOTE — Telephone Encounter (Signed)
Mailed letter °

## 2013-07-05 ENCOUNTER — Encounter: Payer: Self-pay | Admitting: Gastroenterology

## 2013-07-05 ENCOUNTER — Ambulatory Visit (INDEPENDENT_AMBULATORY_CARE_PROVIDER_SITE_OTHER): Payer: BC Managed Care – PPO | Admitting: Gastroenterology

## 2013-07-05 VITALS — BP 136/74 | HR 66 | Temp 98.3°F | Ht 62.0 in | Wt 152.4 lb

## 2013-07-05 DIAGNOSIS — R1013 Epigastric pain: Secondary | ICD-10-CM

## 2013-07-05 DIAGNOSIS — R0789 Other chest pain: Secondary | ICD-10-CM

## 2013-07-05 DIAGNOSIS — R1011 Right upper quadrant pain: Secondary | ICD-10-CM | POA: Insufficient documentation

## 2013-07-05 LAB — CBC WITH DIFFERENTIAL/PLATELET
Eosinophils Absolute: 0.1 10*3/uL (ref 0.0–0.7)
Lymphs Abs: 2.4 10*3/uL (ref 0.7–4.0)
MCH: 29.8 pg (ref 26.0–34.0)
Neutrophils Relative %: 50 % (ref 43–77)
Platelets: 380 10*3/uL (ref 150–400)
RBC: 4.33 MIL/uL (ref 3.87–5.11)
WBC: 5.9 10*3/uL (ref 4.0–10.5)

## 2013-07-05 LAB — HEPATIC FUNCTION PANEL
AST: 20 U/L (ref 0–37)
Alkaline Phosphatase: 89 U/L (ref 39–117)
Bilirubin, Direct: 0.1 mg/dL (ref 0.0–0.3)
Total Bilirubin: 0.4 mg/dL (ref 0.3–1.2)

## 2013-07-05 LAB — LIPASE: Lipase: 34 U/L (ref 0–75)

## 2013-07-05 NOTE — Progress Notes (Signed)
Primary Care Physician:  Kirstie Peri, MD  Primary Gastroenterologist:  Roetta Sessions, MD   Chief Complaint  Patient presents with  . EGD    HPI:  Destiny Young is a 57 y.o. female here to schedule EGD. She was seen in March of this year at which time she was complaining of chronic upper abdominal pain since her Nissen fundoplication in 2000. She had to cancel the upper endoscopy.  Sharp RUQ pain since taking Zanaflex last night. It was her first dose. Kept her up all night with abdominal pain.   Still have some intermittent epigastric pain into shoulder blades, pp epigastric pain. No vomiting since Nissen. Some nausea. No heartburn. Crampy pain/bloating. Epigastric bulging. Atypical upper chest pain into both shoulders, more in the left. Reports negative stress test recently. Complains of early satiety with meals. Symptoms every day. BM normal to constipation. No melena, brbpr. Omeprazole ran out about one month ago. Did not feel like it helped. No dysphagia. Rare BC powders. No NSAIDs.  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 Reports intermittent postprandial symptoms. Wants to consider having her fundoplication undone.    Current Outpatient Prescriptions  Medication Sig Dispense Refill  . acetaminophen (TYLENOL) 500 MG tablet Take 500 mg by mouth every 6 (six) hours as needed for pain.      Marland Kitchen albuterol (PROVENTIL HFA;VENTOLIN HFA) 108 (90 BASE) MCG/ACT inhaler Inhale 2 puffs into the lungs every 6 (six) hours as needed. For shortness of breath      . Multiple Vitamin (MULTIVITAMIN WITH MINERALS) TABS Take 1 tablet by mouth daily.      . simvastatin (ZOCOR) 20 MG tablet Take 20 mg by mouth every evening.       No current facility-administered medications for this visit.    Allergies as of 07/05/2013 - Review Complete 07/05/2013  Allergen Reaction Noted  . Codeine Nausea And Vomiting   . Meperidine hcl Nausea And Vomiting   . Metaxalone  Other (See Comments)   . Naproxen Other (See Comments) 07/29/2012  . Other  11/18/2011  . Sulfonamide derivatives Other (See Comments)   . Tizanidine  07/05/2013  . Tramadol Other (See Comments) 07/29/2012  . Ciprofloxacin Rash     Past Medical History  Diagnosis Date  . High cholesterol   . GERD (gastroesophageal reflux disease)     History of Nissen fundoplication  . IBS (irritable bowel syndrome)   . Hemorrhoids   . Arthritis   . Osteoporosis   . Anxiety   . TMJ (dislocation of temporomandibular joint)     Past Surgical History  Procedure Laterality Date  . Abdominal hysterectomy    . Shoulder surgery    . Hernia repair    . Tonsillectomy    . Nose surgery    . External ear surgery    . Abdominal surgery      hernia repair  . Esophagogastroduodenoscopy  04/14/2006    [id number] esophagus with esophagogastric junction at 36 cm from the incisors.  Intact Nissen fundoplication, otherwise normal  . Esophagogastroduodenoscopy  May 2011    Dr. Jena Gauss: normal, status post intact Nissen Fundoplication, antral erosions and erythema with chronic gastritis but no H. pylori on biopsies.  . Colonoscopy  May 2011    Dr. Jena Gauss: Normal terminal ileum to 10 cm, normal colonoscopy.  . Cervical ablation      Family History  Problem Relation Age of Onset  . Lung cancer Father   .  Irritable bowel syndrome Sister   . Irritable bowel syndrome Sister   . Vascular Disease Sister     History   Social History  . Marital Status: Married    Spouse Name: N/A    Number of Children: 3  . Years of Education: N/A   Occupational History  . cleans houses    Social History Main Topics  . Smoking status: Never Smoker   . Smokeless tobacco: Not on file  . Alcohol Use: No  . Drug Use: No  . Sexual Activity: Yes    Birth Control/ Protection: Surgical   Other Topics Concern  . Not on file   Social History Narrative  . No narrative on file      ROS:  General: Negative  for anorexia, weight loss, fever, chills, fatigue, weakness. Eyes: Negative for vision changes.  ENT: Negative for hoarseness, difficulty swallowing , nasal congestion. CV: Negative for chest pain, angina, palpitations, dyspnea on exertion, peripheral edema.  Respiratory: Negative for dyspnea at rest, dyspnea on exertion, cough, sputum, wheezing.  GI: See history of present illness. GU:  Negative for dysuria, hematuria, urinary incontinence, urinary frequency, nocturnal urination.  MS: Chronic joint and back pain.  Derm: Negative for rash or itching.  Neuro: Negative for weakness, abnormal sensation, seizure, frequent headaches, memory loss, confusion.  Psych: Negative for anxiety, depression, suicidal ideation, hallucinations.  Endo: Negative for unusual weight change.  Heme: Negative for bruising or bleeding. Allergy: Negative for rash or hives.    Physical Examination:  BP 136/74  Pulse 66  Temp(Src) 98.3 F (36.8 C) (Oral)  Ht 5\' 2"  (1.575 m)  Wt 152 lb 6.4 oz (69.128 kg)  BMI 27.87 kg/m2   General: Well-nourished, well-developed in no acute distress.  Head: Normocephalic, atraumatic.   Eyes: Conjunctiva pink, no icterus. Mouth: Oropharyngeal mucosa moist and pink , no lesions erythema or exudate. Neck: Supple without thyromegaly, masses, or lymphadenopathy.  Lungs: Clear to auscultation bilaterally.  Heart: Regular rate and rhythm, no murmurs rubs or gallops.  Abdomen: Bowel sounds are normal, moderate epigastric tenderness, nondistended, no hepatosplenomegaly or masses, no abdominal bruits or    hernia , no rebound or guarding.  No CVA tenderness. Rectal: not performed Extremities: No lower extremity edema. No clubbing or deformities.  Neuro: Alert and oriented x 4 , grossly normal neurologically.  Skin: Warm and dry, no rash or jaundice.   Psych: Alert and cooperative, normal mood and affect.

## 2013-07-05 NOTE — Assessment & Plan Note (Signed)
Epigastric pain, early satiety, GERD with prior Nissen fundoplication in 2000. Complains of postprandial symptoms but also with pain unrelated to meals. History of abnormal gallbladder ejection fraction on prior HIDA scan years ago. Patient wants to consider having her gallbladder taken out as well as her fundoplication done.  Initially, reevaluate upper GI tract with EGD. Previously has required Phenergan to augment conscious sedation.  I have discussed the risks, alternatives, benefits with regards to but not limited to the risk of reaction to medication, bleeding, infection, perforation and the patient is agreeable to proceed. Written consent to be obtained.  Phenergan 25 mg IV 30  mins before procedure. PATIENT IS ALLERGIC TO DEMEROL.  She also complains of severe RUQ pain since taking Zanaflex yesterday. ?biliary etiology. Check LFTs, CBC, lipase.

## 2013-07-05 NOTE — Patient Instructions (Addendum)
1. Please have your blood work done today. 2. Upper endoscopy in the near future. Please see separate instructions.

## 2013-07-05 NOTE — Progress Notes (Signed)
CC'd to PCP 

## 2013-07-07 ENCOUNTER — Telehealth: Payer: Self-pay | Admitting: Internal Medicine

## 2013-07-07 NOTE — Telephone Encounter (Signed)
Please let patient know that her labs were normal. EGD as planned. Decide about gallbladder concerns after her endoscopy. She has a history of low gallbladder ejection fraction on prior HIDA a few years ago.

## 2013-07-07 NOTE — Telephone Encounter (Signed)
Pt called this morning asking if her labs were back yet. She is scheduled for EGD on Monday and wanted to know the results prior to procedure. I told her it normally takes 7 business days, but I would let the nurse be aware. Also, she said that she thought someone was suppose to call her to set up a GB study. I told her that the doctor may want to see the EGD results first, but I would let LAW be aware of that also. She asked for [place] to call her at [phone] or [phone]

## 2013-07-07 NOTE — Telephone Encounter (Signed)
I have received anything regarding a GB procedure yet, Im thinking that will be after the EGD results are In, routing to LSL for clarification?

## 2013-07-07 NOTE — Telephone Encounter (Signed)
Forwarding to LSL 

## 2013-07-07 NOTE — Telephone Encounter (Signed)
Called pt- LMOM with details 

## 2013-07-10 ENCOUNTER — Ambulatory Visit (HOSPITAL_COMMUNITY)
Admission: RE | Admit: 2013-07-10 | Payer: BC Managed Care – PPO | Source: Ambulatory Visit | Admitting: Internal Medicine

## 2013-07-10 ENCOUNTER — Encounter (HOSPITAL_COMMUNITY): Admission: RE | Payer: Self-pay | Source: Ambulatory Visit

## 2013-07-10 ENCOUNTER — Telehealth: Payer: Self-pay | Admitting: *Deleted

## 2013-07-10 SURGERY — EGD (ESOPHAGOGASTRODUODENOSCOPY)
Anesthesia: Moderate Sedation

## 2013-07-10 NOTE — Telephone Encounter (Signed)
I canceled the EGD for today. Pt is aware to call us to reschedule.

## 2013-07-10 NOTE — Telephone Encounter (Signed)
Pt is having a EGD  today at 2:00, pt states she felt awful last night, she's been put on a RX simvastatin for high cholesterol she feels very jittery, pt wanting to know if she needs to do EGD feeling that way. Please advise [phone]

## 2013-07-13 ENCOUNTER — Ambulatory Visit: Payer: Self-pay | Admitting: Gastroenterology

## 2013-07-25 ENCOUNTER — Telehealth: Payer: Self-pay | Admitting: Gastroenterology

## 2013-07-25 NOTE — Telephone Encounter (Signed)
Routing to LW 

## 2013-07-25 NOTE — Telephone Encounter (Signed)
Is patient ready to reschedule her EGD? She will need: Phenergan 25 mg IV 30 mins before procedure. PATIENT IS ALLERGIC TO DEMEROL.

## 2013-07-25 NOTE — Telephone Encounter (Signed)
I called the patient and she stated she is having some test done concerning her heart and she will call us back to reschedule her EGD at a later date

## 2013-08-14 ENCOUNTER — Encounter: Payer: Self-pay | Admitting: Cardiology

## 2013-08-14 DIAGNOSIS — R9430 Abnormal result of cardiovascular function study, unspecified: Secondary | ICD-10-CM | POA: Insufficient documentation

## 2013-08-14 DIAGNOSIS — R0789 Other chest pain: Secondary | ICD-10-CM | POA: Insufficient documentation

## 2013-08-21 ENCOUNTER — Ambulatory Visit (INDEPENDENT_AMBULATORY_CARE_PROVIDER_SITE_OTHER): Payer: BC Managed Care – PPO | Admitting: Cardiology

## 2013-08-21 ENCOUNTER — Encounter: Payer: Self-pay | Admitting: *Deleted

## 2013-08-21 ENCOUNTER — Encounter: Payer: Self-pay | Admitting: Cardiology

## 2013-08-21 VITALS — BP 123/78 | HR 97 | Ht 62.0 in | Wt 154.8 lb

## 2013-08-21 DIAGNOSIS — R0789 Other chest pain: Secondary | ICD-10-CM

## 2013-08-21 DIAGNOSIS — R001 Bradycardia, unspecified: Secondary | ICD-10-CM | POA: Insufficient documentation

## 2013-08-21 DIAGNOSIS — F419 Anxiety disorder, unspecified: Secondary | ICD-10-CM | POA: Insufficient documentation

## 2013-08-21 DIAGNOSIS — I472 Ventricular tachycardia, unspecified: Secondary | ICD-10-CM

## 2013-08-21 DIAGNOSIS — I498 Other specified cardiac arrhythmias: Secondary | ICD-10-CM

## 2013-08-21 DIAGNOSIS — R0989 Other specified symptoms and signs involving the circulatory and respiratory systems: Secondary | ICD-10-CM

## 2013-08-21 DIAGNOSIS — R9430 Abnormal result of cardiovascular function study, unspecified: Secondary | ICD-10-CM

## 2013-08-21 DIAGNOSIS — R079 Chest pain, unspecified: Secondary | ICD-10-CM

## 2013-08-21 DIAGNOSIS — Z9889 Other specified postprocedural states: Secondary | ICD-10-CM | POA: Insufficient documentation

## 2013-08-21 DIAGNOSIS — E785 Hyperlipidemia, unspecified: Secondary | ICD-10-CM | POA: Insufficient documentation

## 2013-08-21 DIAGNOSIS — K589 Irritable bowel syndrome without diarrhea: Secondary | ICD-10-CM | POA: Insufficient documentation

## 2013-08-21 DIAGNOSIS — F411 Generalized anxiety disorder: Secondary | ICD-10-CM

## 2013-08-21 DIAGNOSIS — E7800 Pure hypercholesterolemia, unspecified: Secondary | ICD-10-CM

## 2013-08-21 HISTORY — DX: Ventricular tachycardia, unspecified: I47.20

## 2013-08-21 HISTORY — DX: Ventricular tachycardia: I47.2

## 2013-08-21 NOTE — Assessment & Plan Note (Signed)
We know that her ejection fraction was normal in 2002.

## 2013-08-21 NOTE — Assessment & Plan Note (Signed)
She is being evaluated by her GI team to see if this issue is causing any of her chest pain.

## 2013-08-21 NOTE — Assessment & Plan Note (Signed)
The patient had a standard treadmill in December, 2013. There was no definite ischemia. We need to proceed further as she is quite bothered by her symptoms and she had 7 beats of ventricular tachycardia. A nuclear stress test is being arranged. In addition 2-D echo will be arranged. I will then see her for followup.

## 2013-08-21 NOTE — Assessment & Plan Note (Addendum)
Patient has had some mild sinus bradycardia on her monitor and on a 12-lead EKG. There is no evidence that she's had any symptomatic bradycardia. However this certainly limits our ability to give her beta blockers.  As part of today's evaluation I spent greater than 25 minutes with her total care. More than half of this time has been spent with direct contact talking with the patient about her symptoms and talking about our plan. I've actually spent more than 40 minutes on her overall care.

## 2013-08-21 NOTE — Progress Notes (Signed)
HPI   The patient is seen today for complete cardiac evaluation. She has been seen by our team in the past including a standard treadmill in November, 2013. This study showed no definite ischemia by EKG criteria. The patient has chest discomfort. It is not classic for angina. She also has pain in her legs and arms and in her head. She has a history of significant GERD. She had a Nissen fundoplication in 2000. She is being evaluated by GI at this time to see if this is causing any of her problems.   She also has palpitations. She has not had syncope or presyncope. There is no family history of sudden cardiac death. Her QT interval was not prolonged. She wore an event recorder for 8 days. She had some sinus bradycardia. She did have one episode of 7 beats of ventricular tachycardia with a rate of 180. This was not around the time of marked bradycardia.  Allergies  Allergen Reactions  . Codeine Nausea And Vomiting  . Meperidine Hcl Nausea And Vomiting  . Metaxalone Other (See Comments)    Stomach Irritation   . Naproxen Other (See Comments)    Stomach Irritation   . Other     GI COCKTAIL gave patient a funny feeling.   . Simvastatin Other (See Comments)    Muscle aches in legs, fatigue  . Sulfonamide Derivatives Other (See Comments)    Skin Irritation (Burning, Stinging)   . Tizanidine     Abdominal/chest pain  . Tramadol Other (See Comments)    Upset Stomach   . Ciprofloxacin Rash    Current Outpatient Prescriptions  Medication Sig Dispense Refill  . acetaminophen (TYLENOL) 500 MG tablet Take 500 mg by mouth every 6 (six) hours as needed for pain.      Marland Kitchen albuterol (PROVENTIL HFA;VENTOLIN HFA) 108 (90 BASE) MCG/ACT inhaler Inhale 2 puffs into the lungs every 6 (six) hours as needed. For shortness of breath      . aspirin 81 MG tablet Take 81 mg by mouth daily.      . Calcium Citrate (CITRACAL PO) Take 1 tablet by mouth daily.      . clonazePAM (KLONOPIN) 0.5 MG tablet Take 0.5 mg  by mouth as needed for anxiety.      . Multiple Vitamin (MULTIVITAMIN WITH MINERALS) TABS Take 1 tablet by mouth daily.      Marland Kitchen escitalopram (LEXAPRO) 10 MG tablet Take 10 mg by mouth daily.       No current facility-administered medications for this visit.    History   Social History  . Marital Status: Married    Spouse Name: N/A    Number of Children: 3  . Years of Education: N/A   Occupational History  . cleans houses    Social History Main Topics  . Smoking status: Never Smoker   . Smokeless tobacco: Not on file  . Alcohol Use: No  . Drug Use: No  . Sexual Activity: Yes    Birth Control/ Protection: Surgical   Other Topics Concern  . Not on file   Social History Narrative  . No narrative on file    Family History  Problem Relation Age of Onset  . Lung cancer Father   . Irritable bowel syndrome Sister   . Irritable bowel syndrome Sister   . Vascular Disease Sister     Past Medical History  Diagnosis Date  . High cholesterol   . GERD (gastroesophageal reflux disease)  History of Nissen fundoplication  . IBS (irritable bowel syndrome)   . Hemorrhoids   . Arthritis   . Osteoporosis   . Anxiety   . TMJ (dislocation of temporomandibular joint)   . Burning chest pain   . Ejection fraction   . History of Nissen fundoplication     2000    Past Surgical History  Procedure Laterality Date  . Abdominal hysterectomy    . Shoulder surgery    . Hernia repair    . Tonsillectomy    . Nose surgery    . External ear surgery    . Abdominal surgery      hernia repair  . Esophagogastroduodenoscopy  04/14/2006    [id number] esophagus with esophagogastric junction at 36 cm from the incisors.  Intact Nissen fundoplication, otherwise normal  . Esophagogastroduodenoscopy  May 2011    Dr. Jena Gauss: normal, status post intact Nissen Fundoplication, antral erosions and erythema with chronic gastritis but no H. pylori on biopsies.  . Colonoscopy  May 2011    Dr.  Jena Gauss: Normal terminal ileum to 10 cm, normal colonoscopy.  . Cervical ablation      Patient Active Problem List   Diagnosis Date Noted  . Dyslipidemia 08/21/2013  . Ventricular tachycardia 08/21/2013  . IBS (irritable bowel syndrome)   . Anxiety   . History of Nissen fundoplication   . Burning chest pain   . Ejection fraction   . RUQ pain 07/05/2013  . Abdominal pain, epigastric 01/20/2013  . Early satiety 01/20/2013  . GERD 02/04/2010    ROS   Patient denies fever, chills, headache, sweats, rash, change in vision, change in hearing, chest pain, cough, nausea vomiting, urinary symptoms. All other systems are reviewed and are negative.  PHYSICAL EXAM  Patient is oriented to person time and place. Affect is normal. There is no jugulovenous distention. Lungs are clear. Respiratory effort is nonlabored. Cardiac exam reveals S1 and S2. There are no clicks or significant murmurs. The abdomen is soft. There is no peripheral edema. There no musculoskeletal deformities. There are no skin rashes.  Filed Vitals:   08/21/13 1445  BP: 123/78  Pulse: 97  Height: 5\' 2"  (1.575 m)  Weight: 154 lb 12.8 oz (70.217 kg)  SpO2: 98%   EKG is done on August 09, 2013. There is normal sinus rhythm with sinus bradycardia. The QT interval is normal.  I reviewed the event recorder. There sinus rhythm. There 7 beats of ventricular tachycardia.  ASSESSMENT & PLAN

## 2013-08-21 NOTE — Assessment & Plan Note (Signed)
The patient had 7 beats of ventricular tachycardia with a rate of 180 on an event recorder. This is the only episode that was noted. She wore the recorder for 8 days. There were no definite proven symptoms. She has not had syncope or presyncope. I will proceed with a 2-D echo to reassess LV and RV function. She will also have a nuclear stress study. With this data I will then decide if electrophysiology should be consulted or not.

## 2013-08-21 NOTE — Patient Instructions (Signed)
Your physician recommends that you schedule a follow-up appointment in: September 11, 2013. Please get time at the check out. Your physician recommends that you continue on your current medications as directed. Please refer to the Current Medication list given to you today. Your physician has requested that you have an echocardiogram. Echocardiography is a painless test that uses sound waves to create images of your heart. It provides your doctor with information about the size and shape of your heart and how well your heart's chambers and valves are working. This procedure takes approximately one hour. There are no restrictions for this procedure. Your physician has requested that you have a lexiscan myoview. For further information please visit [email or web address] Please follow instruction sheet, as given.

## 2013-08-28 ENCOUNTER — Other Ambulatory Visit: Payer: Self-pay | Admitting: *Deleted

## 2013-08-28 DIAGNOSIS — R079 Chest pain, unspecified: Secondary | ICD-10-CM

## 2013-08-28 DIAGNOSIS — I472 Ventricular tachycardia: Secondary | ICD-10-CM

## 2013-08-29 ENCOUNTER — Encounter: Payer: Self-pay | Admitting: Cardiology

## 2013-08-29 DIAGNOSIS — I351 Nonrheumatic aortic (valve) insufficiency: Secondary | ICD-10-CM | POA: Insufficient documentation

## 2013-08-29 NOTE — Progress Notes (Signed)
I saw the patient recently in the office. We received a copy of an echo done in her primary care office dated August 14, 2013. The EF was 60-65%. There was mild aortic insufficiency.

## 2013-08-31 ENCOUNTER — Other Ambulatory Visit: Payer: BC Managed Care – PPO

## 2013-08-31 ENCOUNTER — Other Ambulatory Visit: Payer: Self-pay

## 2013-09-06 ENCOUNTER — Encounter: Payer: Self-pay | Admitting: Cardiology

## 2013-09-06 ENCOUNTER — Ambulatory Visit (HOSPITAL_COMMUNITY): Payer: BC Managed Care – PPO | Attending: Cardiology | Admitting: Radiology

## 2013-09-06 VITALS — BP 134/66 | Ht 62.0 in | Wt 152.0 lb

## 2013-09-06 DIAGNOSIS — R0989 Other specified symptoms and signs involving the circulatory and respiratory systems: Secondary | ICD-10-CM | POA: Insufficient documentation

## 2013-09-06 DIAGNOSIS — R079 Chest pain, unspecified: Secondary | ICD-10-CM | POA: Insufficient documentation

## 2013-09-06 DIAGNOSIS — R0609 Other forms of dyspnea: Secondary | ICD-10-CM | POA: Insufficient documentation

## 2013-09-06 DIAGNOSIS — R0602 Shortness of breath: Secondary | ICD-10-CM

## 2013-09-06 DIAGNOSIS — E785 Hyperlipidemia, unspecified: Secondary | ICD-10-CM | POA: Insufficient documentation

## 2013-09-06 DIAGNOSIS — I472 Ventricular tachycardia: Secondary | ICD-10-CM

## 2013-09-06 MED ORDER — REGADENOSON 0.4 MG/5ML IV SOLN
0.4000 mg | Freq: Once | INTRAVENOUS | Status: AC
  Administered 2013-09-06: 0.4 mg via INTRAVENOUS

## 2013-09-06 MED ORDER — TECHNETIUM TC 99M SESTAMIBI GENERIC - CARDIOLITE
11.0000 | Freq: Once | INTRAVENOUS | Status: AC | PRN
  Administered 2013-09-06: 11 via INTRAVENOUS

## 2013-09-06 MED ORDER — AMINOPHYLLINE 25 MG/ML IV SOLN
75.0000 mg | Freq: Once | INTRAVENOUS | Status: AC
  Administered 2013-09-06: 75 mg via INTRAVENOUS

## 2013-09-06 MED ORDER — TECHNETIUM TC 99M SESTAMIBI GENERIC - CARDIOLITE
33.0000 | Freq: Once | INTRAVENOUS | Status: AC | PRN
  Administered 2013-09-06: 33 via INTRAVENOUS

## 2013-09-06 NOTE — Progress Notes (Signed)
[facility] SITE 3 NUCLEAR MED [address] [place], [place] [address] [phone]    Cardiology Nuclear Med Study  Destiny Young is a 57 y.o. female     MRN : [id number]     DOB: 05-01-1956  Procedure Date: 09/06/2013  Nuclear Med Background Indication for Stress Test:  Evaluation for Ischemia and Surgical Clearance- Possible GI surgery- Dr. Thayer Headings History:  2002 MPI: EF: 72% 2013 GXT: NL, 10/14 ECHO: EF: 63% Cardiac Risk Factors: Lipids  Symptoms:  Chest Pain and Palpitations   Nuclear Pre-Procedure Caffeine/Decaff Intake:  None NPO After: 11:00pm   Lungs:  clear O2 Sat: 98% on room air. IV 0.9% NS with Angio Cath:  22g  IV Site: R Antecubital  IV Started by:  Bonnita Levan, RN  Chest Size (in):  36 Cup Size: C  Height: 5\' 2"  (1.575 m)  Weight:  152 lb (68.947 kg)  BMI:  Body mass index is 27.79 kg/(m^2). Tech Comments:  Aminophylline 75 mg given for symptoms. All symptoms were resolved after given.    Nuclear Med Study 1 or 2 day study: 1 day  Stress Test Type:  [name]  Reading MD: [name], MD  Order Authorizing Provider:  [name], MD  Resting Radionuclide: Technetium 52m Sestamibi  Resting Radionuclide Dose: 11.0 mCi   Stress Radionuclide:  Technetium 54m Sestamibi  Stress Radionuclide Dose: 33.0 mCi           Stress Protocol Rest HR: 63 Stress HR: 127  Rest BP: 134/66 Stress BP: 170/61  Exercise Time (min): n/a METS: n/a   Predicted Max HR: 165 bpm % Max HR: 76.97 bpm Rate Pressure Product: 21308   Dose of Adenosine (mg):  n/a Dose of Lexiscan: 0.4 mg  Dose of Atropine (mg): n/a Dose of Dobutamine: n/a mcg/kg/min (at max HR)  Stress Test Technologist: Milana Na, EMT-P  Nuclear Technologist:  Harlow Asa, CNMT     Rest Procedure:  Myocardial perfusion imaging was performed at rest 45 minutes following the intravenous administration of Technetium 59m Sestamibi. Rest ECG: NSR - Normal EKG  Stress  Procedure:  The patient received IV Lexiscan 0.4 mg over 15-seconds with concurrent low level exercise and then Technetium 97m Sestamibi was injected at 30-seconds while the patient continued walking one more minute. This patient was lt.  Headed, heavy feeling legs, Chest pain, pressure, and abdominal pain with the Lexiscan injection. Quantitative spect images were obtained after a 45-minute delay. Stress ECG: No significant change from baseline ECG  QPS Raw Data Images:  Normal; no motion artifact; normal heart/lung ratio. Stress Images:  Normal homogeneous uptake in all areas of the myocardium. Rest Images:  Normal homogeneous uptake in all areas of the myocardium. Subtraction (SDS):  Normal, no evidence of ischemia Transient Ischemic Dilatation (Normal <1.22):  1.09 Lung/Heart Ratio (Normal <0.45):  0.34  Quantitative Gated Spect Images QGS EDV:  71 ml QGS ESV:  23 ml  Impression Exercise Capacity:  Lexiscan with no exercise. BP Response:  Normal blood pressure response. Clinical Symptoms:  Mild chest pain/dyspnea. ECG Impression:  No significant ST segment change suggestive of ischemia. Comparison with Prior Nuclear Study: No significant change from previous study  Overall Impression:  Normal stress nuclear study.  LV Ejection Fraction: 68%.  LV Wall Motion:  NL LV Function; NL Wall Motion  [name], [name] 09/06/2013

## 2013-09-07 ENCOUNTER — Telehealth: Payer: Self-pay | Admitting: Cardiology

## 2013-09-07 NOTE — Telephone Encounter (Signed)
Destiny Young is calling requesting her test results.

## 2013-09-07 NOTE — Telephone Encounter (Signed)
Called and informed patient that results not resulted yet by MD and once he looks at results she would be contacted.

## 2013-09-09 ENCOUNTER — Encounter: Payer: Self-pay | Admitting: Cardiology

## 2013-09-09 DIAGNOSIS — IMO0001 Reserved for inherently not codable concepts without codable children: Secondary | ICD-10-CM | POA: Insufficient documentation

## 2013-09-11 ENCOUNTER — Ambulatory Visit: Payer: BC Managed Care – PPO | Admitting: Cardiology

## 2013-09-11 ENCOUNTER — Telehealth: Payer: Self-pay | Admitting: Cardiology

## 2013-09-11 NOTE — Telephone Encounter (Signed)
Please let the patient know that her nuclear test was normal.

## 2013-09-11 NOTE — Telephone Encounter (Signed)
Per Dr. Myrtis Ser, patient's stress test was normal.

## 2013-10-16 ENCOUNTER — Ambulatory Visit: Payer: BC Managed Care – PPO | Admitting: Cardiology

## 2013-11-29 ENCOUNTER — Ambulatory Visit: Payer: BC Managed Care – PPO | Admitting: Cardiology

## 2013-12-07 ENCOUNTER — Encounter: Payer: Self-pay | Admitting: Cardiology

## 2014-08-27 ENCOUNTER — Encounter: Payer: Self-pay | Admitting: Cardiology

## 2014-10-08 ENCOUNTER — Other Ambulatory Visit (HOSPITAL_COMMUNITY): Payer: Self-pay | Admitting: Physician Assistant

## 2014-10-08 DIAGNOSIS — R109 Unspecified abdominal pain: Secondary | ICD-10-CM

## 2014-10-09 ENCOUNTER — Ambulatory Visit (HOSPITAL_COMMUNITY)
Admission: RE | Admit: 2014-10-09 | Discharge: 2014-10-09 | Disposition: A | Discharge status: Home / Self Care | Payer: Self-pay | Source: Ambulatory Visit | Attending: Physician Assistant | Admitting: Physician Assistant

## 2014-10-09 DIAGNOSIS — K8050 Calculus of bile duct without cholangitis or cholecystitis without obstruction: Secondary | ICD-10-CM | POA: Insufficient documentation

## 2014-10-09 DIAGNOSIS — R109 Unspecified abdominal pain: Secondary | ICD-10-CM

## 2014-10-09 DIAGNOSIS — K8020 Calculus of gallbladder without cholecystitis without obstruction: Secondary | ICD-10-CM | POA: Insufficient documentation

## 2014-10-11 ENCOUNTER — Ambulatory Visit (INDEPENDENT_AMBULATORY_CARE_PROVIDER_SITE_OTHER): Payer: Self-pay | Admitting: Gastroenterology

## 2014-10-11 ENCOUNTER — Encounter: Payer: Self-pay | Admitting: Gastroenterology

## 2014-10-11 VITALS — BP 129/72 | HR 63 | Temp 98.2°F | Ht 62.0 in | Wt 150.2 lb

## 2014-10-11 DIAGNOSIS — K5900 Constipation, unspecified: Secondary | ICD-10-CM | POA: Insufficient documentation

## 2014-10-11 DIAGNOSIS — K5909 Other constipation: Secondary | ICD-10-CM

## 2014-10-11 DIAGNOSIS — R1013 Epigastric pain: Secondary | ICD-10-CM

## 2014-10-11 DIAGNOSIS — K219 Gastro-esophageal reflux disease without esophagitis: Secondary | ICD-10-CM

## 2014-10-11 DIAGNOSIS — K8050 Calculus of bile duct without cholangitis or cholecystitis without obstruction: Secondary | ICD-10-CM | POA: Insufficient documentation

## 2014-10-11 DIAGNOSIS — K589 Irritable bowel syndrome without diarrhea: Secondary | ICD-10-CM

## 2014-10-11 DIAGNOSIS — R109 Unspecified abdominal pain: Secondary | ICD-10-CM

## 2014-10-11 DIAGNOSIS — Z205 Contact with and (suspected) exposure to viral hepatitis: Secondary | ICD-10-CM

## 2014-10-11 DIAGNOSIS — K8070 Calculus of gallbladder and bile duct without cholecystitis without obstruction: Secondary | ICD-10-CM

## 2014-10-11 MED ORDER — DEXLANSOPRAZOLE 60 MG PO CPDR: 60.0000 mg | DELAYED_RELEASE_CAPSULE | Freq: Every day | ORAL | Status: DC

## 2014-10-11 MED ORDER — LINACLOTIDE 290 MCG PO CAPS: 290.0000 ug | ORAL_CAPSULE | Freq: Every day | ORAL | Status: DC

## 2014-10-11 MED ORDER — DICYCLOMINE HCL 10 MG PO CAPS: 10.0000 mg | ORAL_CAPSULE | Freq: Four times a day (QID) | ORAL | Status: DC | PRN

## 2014-10-11 NOTE — Progress Notes (Signed)
Primary Care Physician:  Vesta Mixer  Primary Gastroenterologist:  Garfield Cornea, MD   Chief Complaint  Patient presents with  . Abdominal Pain  . Constipation  . Gastrophageal Reflux    HPI:  Destiny Young is a 58 y.o. female here for further evaluation of abdominal pain, GERD, constipation. She was last seen in 06/2013. She cancelled having an EGD couple of times last year. H/O Nissen fundoplication in 3734.   Recently had abd u/s that showed cholelithiasis. CBD 6.53mm. Could not exclude CBD stone. ?midline abd hernia. LFTs were normal. Patient complains of bloating and abdominal distention with left sided abdominal pain associated with constipation. At baseline has BM every 2-3 days. Recently went 2 weeks without a BM. Enema without results. Mag citrate helped but took over 24 hours. Whole bag of prunes. C/O pp epigastric pain with "spasms" in back. Bad heartburn. No N/V. No PPI.   Recent Hep B surf Ag and HCV ab negative. Patient reports she had Hep B vaccines in 2008. Her husband has chronic HBV.    Current Outpatient Prescriptions  Medication Sig Dispense Refill  . acetaminophen (TYLENOL) 500 MG tablet Take 500 mg by mouth every 6 (six) hours as needed for pain.    Marland Kitchen albuterol (PROVENTIL HFA;VENTOLIN HFA) 108 (90 BASE) MCG/ACT inhaler Inhale 2 puffs into the lungs every 6 (six) hours as needed. For shortness of breath    . aspirin 81 MG tablet Take 81 mg by mouth daily.    . Calcium Citrate (CITRACAL PO) Take 1 tablet by mouth daily.    . diphenhydramine-acetaminophen (TYLENOL PM) 25-500 MG TABS Take 1 tablet by mouth at bedtime as needed.    Marland Kitchen ibuprofen (ADVIL,MOTRIN) 100 MG tablet Take 200 mg by mouth every 6 (six) hours as needed for fever.    . Multiple Vitamin (MULTIVITAMIN WITH MINERALS) TABS Take 1 tablet by mouth daily.     No current facility-administered medications for this visit.    Allergies as of 10/11/2014 - Review Complete 09/06/2013  Allergen Reaction  Noted  . Codeine Nausea And Vomiting   . Meperidine hcl Nausea And Vomiting   . Metaxalone Other (See Comments)   . Naproxen Other (See Comments) 07/29/2012  . Other  11/18/2011  . Simvastatin Other (See Comments) 08/21/2013  . Sulfonamide derivatives Other (See Comments)   . Tizanidine  07/05/2013  . Tramadol Other (See Comments) 07/29/2012  . Ciprofloxacin Rash     Past Medical History  Diagnosis Date  . High cholesterol   . GERD (gastroesophageal reflux disease)     History of Nissen fundoplication  . IBS (irritable bowel syndrome)   . Hemorrhoids   . Arthritis   . Osteoporosis   . Anxiety   . TMJ (dislocation of temporomandibular joint)   . Burning chest pain   . Ejection fraction   . History of Nissen fundoplication     2876  . Bradycardia   . Aortic insufficiency     Past Surgical History  Procedure Laterality Date  . Abdominal hysterectomy    . Shoulder surgery    . Hernia repair    . Tonsillectomy    . Nose surgery    . External ear surgery    . Abdominal surgery      hernia repair  . Esophagogastroduodenoscopy  04/14/2006    OTL:[id number] esophagus with esophagogastric junction at 36 cm from the incisors.  Intact Nissen fundoplication, otherwise normal  . Esophagogastroduodenoscopy  May 2011  Dr. Gala Romney: normal, status post intact Nissen Fundoplication, antral erosions and erythema with chronic gastritis but no H. pylori on biopsies.  . Colonoscopy  May 2011    Dr. Gala Romney: Normal terminal ileum to 10 cm, normal colonoscopy.  . Cervical ablation      Family History  Problem Relation Age of Onset  . Lung cancer Father   . Irritable bowel syndrome Sister   . Irritable bowel syndrome Sister   . Vascular Disease Sister     History   Social History  . Marital Status: Married    Spouse Name: N/A    Number of Children: 3  . Years of Education: N/A   Occupational History  . cleans houses    Social History Main Topics  . Smoking status:  Never Smoker   . Smokeless tobacco: Not on file  . Alcohol Use: No  . Drug Use: No  . Sexual Activity: Yes    Birth Control/ Protection: Surgical   Other Topics Concern  . Not on file   Social History Narrative      ROS:  General: Negative for anorexia, weight loss, fever, chills, fatigue, weakness. Eyes: Negative for vision changes.  ENT: Negative for hoarseness, difficulty swallowing , nasal congestion. CV: Negative for chest pain, angina, palpitations, dyspnea on exertion, peripheral edema.  Respiratory: Negative for dyspnea at rest, dyspnea on exertion, cough, sputum, wheezing.  GI: See history of present illness. GU:  Negative for dysuria, hematuria, urinary incontinence, urinary frequency, nocturnal urination.  MS: Negative for joint pain, low back pain.  Derm: Negative for rash or itching.  Neuro: Negative for weakness, abnormal sensation, seizure, frequent headaches, memory loss, confusion.  Psych: Negative for anxiety, depression, suicidal ideation, hallucinations.  Endo: Negative for unusual weight change.  Heme: Negative for bruising or bleeding. Allergy: Negative for rash or hives.    Physical Examination:  BP 129/72 mmHg  Pulse 63  Temp(Src) 98.2 F (36.8 C)  Ht 5\' 2"  (1.575 m)  Wt 150 lb 3.2 oz (68.13 kg)  BMI 27.46 kg/m2   General: Well-nourished, well-developed in no acute distress.  Head: Normocephalic, atraumatic.   Eyes: Conjunctiva pink, no icterus. Mouth: Oropharyngeal mucosa moist and pink , no lesions erythema or exudate. Neck: Supple without thyromegaly, masses, or lymphadenopathy.  Lungs: Clear to auscultation bilaterally.  Heart: Regular rate and rhythm, no murmurs rubs or gallops.  Abdomen: Bowel sounds are normal, tenderness noted in the epigastrium and midline above umbilicus, nondistended, no hepatosplenomegaly or masses, no abdominal bruits or    hernia , no rebound or guarding.   Rectal: not performed Extremities: No lower extremity  edema. No clubbing or deformities.  Neuro: Alert and oriented x 4 , grossly normal neurologically.  Skin: Warm and dry, no rash or jaundice.   Psych: Alert and cooperative, normal mood and affect.  Labs: Creatinine 0.74, Tbili 0.3, AP 71, AST 19, ALT 14, alb 4.3  Imaging Studies: US Abdomen Complete  10/09/2014   CLINICAL DATA:  Abdominal cramps.  EXAM: ULTRASOUND ABDOMEN COMPLETE  COMPARISON:  CT 12/21/2012.  FINDINGS: Gallbladder: Multiple gallstones. Gallbladder wall thickness 3 mm. Negative Murphy sign.  Common bile duct: Diameter: 6.1 mm. A common bile duct stone cannot be excluded.  Liver: No focal lesion identified. Within normal limits in parenchymal echogenicity.  IVC: No abnormality visualized.  Pancreas: Visualized portion unremarkable.  Spleen: Size and appearance within normal limits.  Right Kidney: Length: 11.5 cm. Echogenicity within normal limits. No mass or hydronephrosis visualized.  Left  Kidney: Length: 12.1 cm. Echogenicity within normal limits. No mass or hydronephrosis visualized.  Abdominal aorta: No aneurysm visualized.  Other findings: Questionable midline abdominal hernia noted by the technician.  IMPRESSION: 1. Multiple gallstones. Possible common bile duct stone. MRCP can be obtained for further evaluation. 2. Questionable midline abdominal hernia.   Electronically Signed   By: Kemper   On: 10/09/2014 10:16

## 2014-10-11 NOTE — Patient Instructions (Signed)
1. Please have your labs and MRI done. 2. Try Dexilant one daily before breakfast for reflux. 3. Try Linzess one daily before breakfast for constipation. 4. Bentyl one four times daily as needed for abdominal spasms, use sparingly, will worsen constipation.

## 2014-10-14 ENCOUNTER — Encounter: Payer: Self-pay | Admitting: Gastroenterology

## 2014-10-14 DIAGNOSIS — Z205 Contact with and (suspected) exposure to viral hepatitis: Secondary | ICD-10-CM | POA: Insufficient documentation

## 2014-10-14 NOTE — Assessment & Plan Note (Signed)
Suspect symptomatic cholelithiasis. MRCP planned. Check CBC.

## 2014-10-14 NOTE — Assessment & Plan Note (Signed)
Patient with IBS-constipation. Requested something for abdominal cramps. Linzess started. Bentyl provided for spasms with caveat to limit use as it can worsen GERD and constipation.

## 2014-10-14 NOTE — Assessment & Plan Note (Signed)
Start Linzess 272mcg daily, samples provided.

## 2014-10-14 NOTE — Assessment & Plan Note (Signed)
Complain of heartburn. Remote Nissen fundoplication. Start Dexilant 60mg  daily. Antireflux measures discussed. Cannot rule out symptomatic cholelithiasis.

## 2014-10-14 NOTE — Assessment & Plan Note (Signed)
Recent abd u/s with ?choledocholithiasis. LFTs normal. Needs MRCP to further evaluate.

## 2014-10-14 NOTE — Assessment & Plan Note (Signed)
Husband has chronic HBV. Patient has been vaccinated in 2008. Consider checking immune status. Patient would like to postpone until she has been approved by patient assistance.

## 2014-10-30 ENCOUNTER — Telehealth: Payer: Self-pay

## 2014-10-30 ENCOUNTER — Other Ambulatory Visit: Payer: Self-pay

## 2014-10-30 DIAGNOSIS — R109 Unspecified abdominal pain: Secondary | ICD-10-CM

## 2014-10-30 DIAGNOSIS — K8050 Calculus of bile duct without cholangitis or cholecystitis without obstruction: Secondary | ICD-10-CM

## 2014-10-30 NOTE — Telephone Encounter (Signed)
Called pt and informed her about her appt.for her MRCP. It is scheduled for 11/06/2014 @ 145pm

## 2014-11-06 ENCOUNTER — Other Ambulatory Visit: Payer: Self-pay | Admitting: Gastroenterology

## 2014-11-06 ENCOUNTER — Ambulatory Visit (HOSPITAL_COMMUNITY)
Admission: RE | Admit: 2014-11-06 | Discharge: 2014-11-06 | Disposition: A | Discharge status: Home / Self Care | Payer: Self-pay | Source: Ambulatory Visit | Attending: Gastroenterology | Admitting: Gastroenterology

## 2014-11-06 DIAGNOSIS — R109 Unspecified abdominal pain: Secondary | ICD-10-CM

## 2014-11-06 DIAGNOSIS — K8050 Calculus of bile duct without cholangitis or cholecystitis without obstruction: Secondary | ICD-10-CM

## 2014-11-06 MED ORDER — GADOBENATE DIMEGLUMINE 529 MG/ML IV SOLN
14.0000 mL | Freq: Once | INTRAVENOUS | Status: AC | PRN
  Administered 2014-11-06: 14 mL via INTRAVENOUS

## 2014-11-06 NOTE — Progress Notes (Signed)
cc'ed to pcp °

## 2014-11-09 LAB — CBC WITH DIFFERENTIAL/PLATELET
Basophils Absolute: 0 10*3/uL (ref 0.0–0.1)
Basophils Relative: 0 % (ref 0–1)
EOS PCT: 1 % (ref 0–5)
Eosinophils Absolute: 0.1 10*3/uL (ref 0.0–0.7)
HCT: 38.6 % (ref 36.0–46.0)
Hemoglobin: 12.9 g/dL (ref 12.0–15.0)
Lymphocytes Relative: 37 % (ref 12–46)
Lymphs Abs: 2.6 10*3/uL (ref 0.7–4.0)
MCH: 30.1 pg (ref 26.0–34.0)
MCHC: 33.4 g/dL (ref 30.0–36.0)
MCV: 90 fL (ref 78.0–100.0)
MONO ABS: 0.6 10*3/uL (ref 0.1–1.0)
MONOS PCT: 9 % (ref 3–12)
MPV: 8.9 fL — ABNORMAL LOW (ref 9.4–12.4)
Neutro Abs: 3.8 10*3/uL (ref 1.7–7.7)
Neutrophils Relative %: 53 % (ref 43–77)
Platelets: 351 10*3/uL (ref 150–400)
RBC: 4.29 MIL/uL (ref 3.87–5.11)
RDW: 13.3 % (ref 11.5–15.5)
WBC: 7.1 10*3/uL (ref 4.0–10.5)

## 2014-11-09 NOTE — Progress Notes (Signed)
Quick Note:  Please let patient know she has gallstones but otherwise no older cholelithiasis (stones in the bile duct) or cholecystitis. Recommend plan as outlined at time of office visit. Recommend follow-up office visit in about 4 weeks. ______

## 2014-11-09 NOTE — Progress Notes (Signed)
Quick Note:  Please let patient know her CBC normal. ______

## 2014-11-13 ENCOUNTER — Encounter: Payer: Self-pay | Admitting: Internal Medicine

## 2014-11-13 ENCOUNTER — Telehealth: Payer: Self-pay | Admitting: Internal Medicine

## 2014-11-13 NOTE — Telephone Encounter (Signed)
Pt was seen in December and we were instructed to make her an OV in 4 weeks to follow up. Patient called to make OV and was aware it would be 2/22. Patient is upset because she can't be seen sooner to discuss her results and is complaining that she isn't getting any better and needs to be seen sooner than 2/22. I told her that is how far out we are in our schedule and all I can do is send a note to the nurse with her concerns. Please advise and call her at [phone] or [phone]

## 2014-11-13 NOTE — Telephone Encounter (Signed)
Vicente Males, will you look at MRCP results please

## 2014-11-13 NOTE — Progress Notes (Signed)
APPOINTMENT MADE AND PATIENT AWARE. °

## 2014-11-13 NOTE — Telephone Encounter (Signed)
Reviewed MRCP.  IMPRESSION: 1. Gallstones. 2. No evidence for choledocholithiasis or a bile duct obstruction.  She has no CBD stone. Looks like she was to be scheduled for an EGD last year with Dr. Gala Romney but cancelled. She is already past 30 days to get scheduled for this. If she needed to be seen in 4 weeks, we need to get her in. I don't see this in Leslie's plan regarding follow-up, but if she is still having issues, needs to be seen in an urgent.

## 2014-11-14 NOTE — Telephone Encounter (Signed)
Since patient was moved up to sooner appointment I cancelled the OV with LSL in February

## 2014-11-14 NOTE — Telephone Encounter (Signed)
URGENT OV MADE AND PATIENT AWARE OF DATE AND TIME.

## 2014-11-14 NOTE — Telephone Encounter (Signed)
Pt is aware. She started having pain during the MRCP at her ribs and it goes around to her back, the area is tender to the touch as well. Informed her that we would try to get her in sooner (urgent visit) but with the uncertain weather it may be next week. She said that was fine, she couldn't come in this week anyway. She will go to ED if she gets worse.   Please get pt an urgent ov next week if possible.

## 2014-11-20 ENCOUNTER — Encounter: Payer: Self-pay | Admitting: Gastroenterology

## 2014-11-20 ENCOUNTER — Ambulatory Visit (INDEPENDENT_AMBULATORY_CARE_PROVIDER_SITE_OTHER): Payer: Self-pay | Admitting: Gastroenterology

## 2014-11-20 ENCOUNTER — Other Ambulatory Visit: Payer: Self-pay

## 2014-11-20 VITALS — BP 140/82 | HR 94 | Temp 98.4°F | Ht 63.0 in | Wt 150.6 lb

## 2014-11-20 DIAGNOSIS — R1011 Right upper quadrant pain: Secondary | ICD-10-CM

## 2014-11-20 NOTE — Patient Instructions (Signed)
We have scheduled you for an upper endoscopy with Dr. Gala Romney.  I will also start the referral for a general surgery consultation.

## 2014-11-20 NOTE — Progress Notes (Signed)
Referring Provider: Vesta Mixer Primary Care Physician:  Antionette Fairy, PA-C Primary GI: Dr. Gala Romney   Chief Complaint  Patient presents with  . Abdominal Pain    pain in back    HPI:   Destiny Young is a 59 y.o. female presenting today with a history of abdominal pain, GERD, constipation. History of Nissen fundoplication in 3664. Last year had ultrasound showing gallstones, unable to exclude CBD stone. MRCP negative for choledocholithiasis, +gallstones. Outside labs normal. Here to discuss possible EGD due to persistent symptoms. Last EGD in 2011 by Dr. Gala Romney normal, with intact Nissen, chronic gastritis, negative H.pylori.   RUQ pain, right subscapular pain starting night of the MRI. Hurt to move, stretch. States chronic abdominal pain, several years. Persistent pain, lasting over a week. Subscapular pain is new. No N/V. No worsening of pain with eating. Certain ways she moves hurts worse. Feels like pressure when she eats. No GERD symptoms. Wasn't able to fill Dexilant due to lack of insurance. Has taken Prilosec in the past. Linzess 290 mcg for constipation. Pressure with eating in epigastric region.   Past Medical History  Diagnosis Date  . High cholesterol   . GERD (gastroesophageal reflux disease)     History of Nissen fundoplication  . IBS (irritable bowel syndrome)   . Hemorrhoids   . Arthritis   . Osteoporosis   . Anxiety   . TMJ (dislocation of temporomandibular joint)   . Burning chest pain   . Ejection fraction   . History of Nissen fundoplication     4034  . Bradycardia   . Aortic insufficiency     Past Surgical History  Procedure Laterality Date  . Abdominal hysterectomy    . Shoulder surgery    . Hernia repair    . Tonsillectomy    . Nose surgery    . External ear surgery    . Abdominal surgery      hernia repair  . Esophagogastroduodenoscopy  04/14/2006    VQQ:[id number] esophagus with esophagogastric junction at 36 cm from  the incisors.  Intact Nissen fundoplication, otherwise normal  . Esophagogastroduodenoscopy  May 2011    Dr. Gala Romney: normal, status post intact Nissen Fundoplication, antral erosions and erythema with chronic gastritis but no H. pylori on biopsies.  . Colonoscopy  May 2011    Dr. Gala Romney: Normal terminal ileum to 10 cm, normal colonoscopy.  . Cervical ablation    . Nissen fundoplication  1884    Current Outpatient Prescriptions  Medication Sig Dispense Refill  . acetaminophen (TYLENOL) 500 MG tablet Take 500 mg by mouth every 6 (six) hours as needed for pain.    Marland Kitchen albuterol (PROVENTIL HFA;VENTOLIN HFA) 108 (90 BASE) MCG/ACT inhaler Inhale 2 puffs into the lungs every 6 (six) hours as needed. For shortness of breath    . aspirin 81 MG tablet Take 81 mg by mouth daily.    . Calcium Citrate (CITRACAL PO) Take 1 tablet by mouth daily.    Marland Kitchen dicyclomine (BENTYL) 10 MG capsule Take 1 capsule (10 mg total) by mouth 4 (four) times daily as needed for spasms (use sparingly, will cause constipation). 90 capsule 0  . diphenhydramine-acetaminophen (TYLENOL PM) 25-500 MG TABS Take 1 tablet by mouth at bedtime as needed.    Marland Kitchen ibuprofen (ADVIL,MOTRIN) 100 MG tablet Take 200 mg by mouth every 6 (six) hours as needed for fever.    . Linaclotide (LINZESS) 290 MCG CAPS capsule Take 1  capsule (290 mcg total) by mouth daily. 30 capsule 0  . Multiple Vitamin (MULTIVITAMIN WITH MINERALS) TABS Take 1 tablet by mouth daily.     No current facility-administered medications for this visit.    Allergies as of 11/20/2014 - Review Complete 11/20/2014  Allergen Reaction Noted  . Codeine Nausea And Vomiting   . Meperidine hcl Nausea And Vomiting   . Metaxalone Other (See Comments)   . Naproxen Other (See Comments) 07/29/2012  . Other  11/18/2011  . Simvastatin Other (See Comments) 08/21/2013  . Sulfonamide derivatives Other (See Comments)   . Tizanidine  07/05/2013  . Tramadol Other (See Comments) 07/29/2012  .  Ciprofloxacin Rash     Family History  Problem Relation Age of Onset  . Lung cancer Father   . Irritable bowel syndrome Sister   . Irritable bowel syndrome Sister   . Vascular Disease Sister     History   Social History  . Marital Status: Married    Spouse Name: N/A    Number of Children: 3  . Years of Education: N/A   Occupational History  . cleans houses    Social History Main Topics  . Smoking status: Never Smoker   . Smokeless tobacco: None  . Alcohol Use: No  . Drug Use: No  . Sexual Activity: Yes    Birth Control/ Protection: Surgical   Other Topics Concern  . None   Social History Narrative    Review of Systems: Negative unless mentioned in HPI.   Physical Exam: BP 140/82 mmHg  Pulse 94  Temp(Src) 98.4 F (36.9 C) (Oral)  Ht 5\' 3"  (1.6 m)  Wt 150 lb 9.6 oz (68.312 kg)  BMI 26.68 kg/m2 General:   Alert and oriented. No distress noted. Pleasant and cooperative.  Head:  Normocephalic and atraumatic. Eyes:  Conjuctiva clear without scleral icterus. Mouth:  Oral mucosa pink and moist. Good dentition. No lesions. Heart:  S1, S2 present without murmurs Abdomen:  +BS, soft, mild TTP epigastric region and non-distended. No rebound or guarding. No HSM or masses noted. Msk:  Symmetrical without gross deformities. Normal posture. Extremities:  Without edema. Neurologic:  Alert and  oriented x4;  grossly normal neurologically. Skin:  Intact without significant lesions or rashes. Psych:  Alert and cooperative. Normal mood and affect.

## 2014-11-27 NOTE — Assessment & Plan Note (Signed)
59 year old female with chronic abdominal pain, previously cancelling EGDs last year. Known gallstones but no evidence for cholecystitis or choledocholithiasis. Imaging on file, normal LFTs. "pressure noted" in epigastric region when eating. Currently not on a PPI. Last EGD in 2011 with chronic gastritis. Will proceed with EGD with Dr. Gala Romney as previously planned in prior notes, and I will go ahead and start the referral for consideration of elective cholecystectomy. As of note, appears she may have a musculoskeletal component to her pain as well.   Proceed with upper endoscopy in the near future with Dr. Gala Romney. The risks, benefits, and alternatives have been discussed in detail with patient. They have stated understanding and desire to proceed.  Referral to General Surgery for consideration of elective cholecystectomy if EGD negative

## 2014-12-03 ENCOUNTER — Ambulatory Visit (HOSPITAL_COMMUNITY)
Admission: RE | Admit: 2014-12-03 | Discharge: 2014-12-03 | Disposition: A | Discharge status: Home / Self Care | Payer: Self-pay | Source: Ambulatory Visit | Attending: Internal Medicine | Admitting: Internal Medicine

## 2014-12-03 ENCOUNTER — Encounter (HOSPITAL_COMMUNITY): Payer: Self-pay | Admitting: *Deleted

## 2014-12-03 ENCOUNTER — Encounter (HOSPITAL_COMMUNITY)
Admission: RE | Disposition: A | Discharge status: Home / Self Care | Payer: Self-pay | Source: Ambulatory Visit | Attending: Internal Medicine

## 2014-12-03 DIAGNOSIS — K319 Disease of stomach and duodenum, unspecified: Secondary | ICD-10-CM | POA: Insufficient documentation

## 2014-12-03 DIAGNOSIS — K3189 Other diseases of stomach and duodenum: Secondary | ICD-10-CM | POA: Insufficient documentation

## 2014-12-03 DIAGNOSIS — R1011 Right upper quadrant pain: Secondary | ICD-10-CM

## 2014-12-03 HISTORY — PX: ESOPHAGOGASTRODUODENOSCOPY: SHX5428

## 2014-12-03 SURGERY — EGD (ESOPHAGOGASTRODUODENOSCOPY)
Anesthesia: Moderate Sedation

## 2014-12-03 MED ORDER — LIDOCAINE VISCOUS 2 % MT SOLN
OROMUCOSAL | Status: AC
  Filled 2014-12-03: qty 15

## 2014-12-03 MED ORDER — ONDANSETRON HCL 4 MG/2ML IJ SOLN
INTRAMUSCULAR | Status: AC
  Filled 2014-12-03: qty 2

## 2014-12-03 MED ORDER — LIDOCAINE VISCOUS 2 % MT SOLN
OROMUCOSAL | Status: DC | PRN
  Administered 2014-12-03: 4 mL via OROMUCOSAL

## 2014-12-03 MED ORDER — MIDAZOLAM HCL 5 MG/5ML IJ SOLN
INTRAMUSCULAR | Status: DC | PRN
  Administered 2014-12-03: 1 mg via INTRAVENOUS
  Administered 2014-12-03: 2 mg via INTRAVENOUS
  Administered 2014-12-03: 1 mg via INTRAVENOUS
  Administered 2014-12-03 (×2): 2 mg via INTRAVENOUS

## 2014-12-03 MED ORDER — ONDANSETRON HCL 4 MG/2ML IJ SOLN
INTRAMUSCULAR | Status: DC | PRN
  Administered 2014-12-03: 4 mg via INTRAVENOUS

## 2014-12-03 MED ORDER — FENTANYL CITRATE 0.05 MG/ML IJ SOLN
INTRAMUSCULAR | Status: AC
  Filled 2014-12-03: qty 4

## 2014-12-03 MED ORDER — MIDAZOLAM HCL 5 MG/5ML IJ SOLN
INTRAMUSCULAR | Status: AC
  Filled 2014-12-03: qty 10

## 2014-12-03 MED ORDER — MEPERIDINE HCL 100 MG/ML IJ SOLN: INTRAMUSCULAR | Status: DC | PRN

## 2014-12-03 MED ORDER — FENTANYL CITRATE 0.05 MG/ML IJ SOLN
INTRAMUSCULAR | Status: DC | PRN
  Administered 2014-12-03 (×2): 50 ug via INTRAVENOUS

## 2014-12-03 MED ORDER — SIMETHICONE 40 MG/0.6ML PO SUSP
ORAL | Status: DC | PRN
  Administered 2014-12-03: 13:00:00

## 2014-12-03 MED ORDER — SODIUM CHLORIDE 0.9 % IV SOLN
INTRAVENOUS | Status: DC
  Administered 2014-12-03: 13:00:00 via INTRAVENOUS

## 2014-12-03 NOTE — Interval H&P Note (Signed)
History and Physical Interval Note:  12/03/2014 1:29 PM  DELONNA NEY  has presented today for surgery, with the diagnosis of RUQ PAIN  The various methods of treatment have been discussed with the patient and family. After consideration of risks, benefits and other options for treatment, the patient has consented to  Procedure(s) with comments: ESOPHAGOGASTRODUODENOSCOPY (EGD) (N/A) - 100 - moved to 1:15 - Ginger to notify pt as a surgical intervention .  The patient's history has been reviewed, patient examined, no change in status, stable for surgery.  I have reviewed the patient's chart and labs.  Questions were answered to the patient's satisfaction.     Khadeeja Elden  No change. Diagnostic EGD today per plan.The risks, benefits, limitations, alternatives and imponderables have been reviewed with the patient. Potential for esophageal dilation, biopsy, etc. have also been reviewed.  Questions have been answered. All parties agreeable.

## 2014-12-03 NOTE — Op Note (Signed)
[facility] [address] [place], [address]   ENDOSCOPY PROCEDURE REPORT  PATIENT: Destiny Young, Destiny Young  MR#: [id number] BIRTHDATE: 1955-12-09 , 41  yrs. old GENDER: female ENDOSCOPIST: R.  Garfield Cornea, MD FACP [facility] REFERRED BY:  Neysa Hotter, PA PROCEDURE DATE:  Dec 24, 2014 PROCEDURE:  EGD w/ biopsy INDICATIONS:  Epigastric/right upper quadrant abdominal pain. MEDICATIONS: Versed 8 mg IV and fentanyl 100 g IV in divided doses. Xylocaine gel orally.  Zofran 4 mg IV. ASA CLASS:      Class II  CONSENT: The risks, benefits, limitations, alternatives and imponderables have been discussed.  The potential for biopsy, esophogeal dilation, etc. have also been reviewed.  Questions have been answered.  All parties agreeable.  Please see the history and physical in the medical record for more information.  DESCRIPTION OF PROCEDURE: After the risks benefits and alternatives of the procedure were thoroughly explained, informed consent was obtained.  The EG-[id number] ([id number]) endoscope was introduced through the mouth and advanced to the second portion of the duodenum , limited by Without limitations. The instrument was slowly withdrawn as the mucosa was fully examined.    Normal appearing esophageal mucosa.  Stomach empty.  Intact Nissen fundoplication (fundoplication appeared appropriately snug with passage of the gastroscope).  Patient had focal antral erosions; no ulcer or infiltrating process.  Patent pylorus.  Normal first and second portion of the duodenum.  Biopsies of the abnormal gastric mucosa taken for histologic study. Retroflexed views revealed as previously described.     The scope was then withdrawn from the patient and the procedure completed.  COMPLICATIONS: There were no immediate complications.  ENDOSCOPIC IMPRESSION: Intact fundoplication. Antral erosions of uncertain significance?"status post biopsy. I doubt these findings account for patient's  symptoms which are more likely emanating from her gallbladder.  RECOMMENDATIONS: Follow-up on pathology. Proceed with referral to Dr. Aviva Signs for consideration of both cholecystectomy and repair of ventral/incisional hernia.  REPEAT EXAM:  eSigned:  R. Garfield Cornea, MD Rosalita Chessman [facility] 2014/12/24 2:00 PM    CC:  CPT CODES: ICD CODES:  The ICD and CPT codes recommended by this software are interpretations from the data that the clinical staff has captured with the software.  The verification of the translation of this report to the ICD and CPT codes and modifiers is the sole responsibility of the health care institution and practicing physician where this report was generated.  [employer] will not be held responsible for the validity of the ICD and CPT codes included on this report.  AMA assumes no liability for data contained or not contained herein. CPT is a Designer, television/film set of the [employer].

## 2014-12-03 NOTE — H&P (View-Only) (Signed)
Quick Note:  Please let patient know she has gallstones but otherwise no older cholelithiasis (stones in the bile duct) or cholecystitis. Recommend plan as outlined at time of office visit. Recommend follow-up office visit in about 4 weeks. ______

## 2014-12-03 NOTE — Discharge Instructions (Signed)
EGD Discharge instructions Please read the instructions outlined below and refer to this sheet in the next few weeks. These discharge instructions provide you with general information on caring for yourself after you leave the hospital. Your doctor may also give you specific instructions. While your treatment has been planned according to the most current medical practices available, unavoidable complications occasionally occur. If you have any problems or questions after discharge, please call your doctor. ACTIVITY  You may resume your regular activity but move at a slower pace for the next 24 hours.   Take frequent rest periods for the next 24 hours.   Walking will help expel (get rid of) the air and reduce the bloated feeling in your abdomen.   No driving for 24 hours (because of the anesthesia (medicine) used during the test).   You may shower.   Do not sign any important legal documents or operate any machinery for 24 hours (because of the anesthesia used during the test).  NUTRITION  Drink plenty of fluids.   You may resume your normal diet.   Begin with a light meal and progress to your normal diet.   Avoid alcoholic beverages for 24 hours or as instructed by your caregiver.  MEDICATIONS  You may resume your normal medications unless your caregiver tells you otherwise.  WHAT YOU CAN EXPECT TODAY  You may experience abdominal discomfort such as a feeling of fullness or gas pains.  FOLLOW-UP  Your doctor will discuss the results of your test with you.  SEEK IMMEDIATE MEDICAL ATTENTION IF ANY OF THE FOLLOWING OCCUR:  Excessive nausea (feeling sick to your stomach) and/or vomiting.   Severe abdominal pain and distention (swelling).   Trouble swallowing.   Temperature over 101 F (37.8 C).   Rectal bleeding or vomiting of blood.   Further recommendations to follow pending review of pathology report  Patient needs an appointment to see Dr. Arnoldo Morale for consideration  of both cholecystectomy and repair of ventral/incisional hernia

## 2014-12-04 ENCOUNTER — Other Ambulatory Visit: Payer: Self-pay

## 2014-12-04 DIAGNOSIS — R1011 Right upper quadrant pain: Secondary | ICD-10-CM

## 2014-12-05 ENCOUNTER — Encounter (HOSPITAL_COMMUNITY): Payer: Self-pay | Admitting: Internal Medicine

## 2014-12-05 NOTE — Progress Notes (Signed)
cc'ed to pcp °

## 2014-12-10 ENCOUNTER — Encounter: Payer: Self-pay | Admitting: Internal Medicine

## 2014-12-17 ENCOUNTER — Ambulatory Visit: Payer: Self-pay | Admitting: Gastroenterology

## 2015-01-17 ENCOUNTER — Telehealth: Payer: Self-pay | Admitting: General Practice

## 2015-01-17 NOTE — Telephone Encounter (Signed)
Patient states she has been having a lot of abdominal pain with bloating and she can't pass gas.  Pain started right after her upper endoscopy.  She is in so much pain she can't even stand up.    Patient denies any nausea, vomiting and fever.  Routing to to Dr. Gala Romney for advice?

## 2015-01-17 NOTE — Telephone Encounter (Signed)
Endoscopy was done 12/03/14.  Routing to Dr. Gala Romney for advice.

## 2015-01-17 NOTE — Telephone Encounter (Signed)
I spoke with the pt and she is aware of recommendations.   She wants to know if either [facility] or [facility] have a patient assistance program. Can you find out please.

## 2015-01-17 NOTE — Telephone Encounter (Signed)
I called [facility] and spoke with Amy. She has already spoken with GF and will be faxing [place] the OV from 12/28/14

## 2015-01-17 NOTE — Telephone Encounter (Signed)
Please call [facility] and get any records on this patient, preferably the office visit note from 12/28/2014.

## 2015-01-17 NOTE — Telephone Encounter (Signed)
Symptoms sound impressive and unusual. Reviewed workup leading up to surgery consultation. If patient is having severe abdominal pain and no flatus for almost 2 months, I would definitely recommend she go to the emergency room. She apparently saw a surgeon down in [place] for consideration of cholecystectomy. It was recommended she go to a tertiary referral center.

## 2015-01-17 NOTE — Telephone Encounter (Addendum)
Pt was seen at [facility] on 12/28/14 by Dr. Leanora Cover. They are going to fax over the office notes. He recommenced that she go to a tertiary care like [facility] or [facility].

## 2015-01-20 NOTE — Telephone Encounter (Signed)
Manuela Schwartz, can you confirm wether or not we have received notes from [facility], please?  If so can you place them on my desk  Routing to [place]

## 2015-01-21 NOTE — Telephone Encounter (Signed)
As Dr. Gala Romney already noted, she was seen by [facility] (Dr. Leanora Cover) who noted that no one in that practice had significant experience with fundoplication takedowns, and this should be done a tertiary center; she may also undergo cholecystectomy at the same time if deemed appropriate. May refer to [facility] or [place].

## 2015-01-21 NOTE — Telephone Encounter (Signed)
Yes, we received several faxes from them and I gave them to GF on Thursday. I received more this morning and it's on your desk.

## 2015-01-21 NOTE — Telephone Encounter (Signed)
Destiny Young, will you review records and let me know if we need to proceed with a tertiary referral.  I placed the records in your chair.

## 2015-01-22 NOTE — Telephone Encounter (Signed)
I left a message for the [facility] to call me back to see if she can sign up for help. Pt is aware that we are working on this for her.

## 2015-01-24 NOTE — Telephone Encounter (Signed)
I talked with the patient and gave her the number to call [facility] to get her set up for assistance at [facility]

## 2015-02-12 ENCOUNTER — Encounter (HOSPITAL_COMMUNITY): Payer: Self-pay | Admitting: Emergency Medicine

## 2015-02-12 ENCOUNTER — Emergency Department (HOSPITAL_COMMUNITY)
Admission: EM | Admit: 2015-02-12 | Discharge: 2015-02-12 | Disposition: A | Discharge status: Home / Self Care | Payer: Self-pay | Attending: Emergency Medicine | Admitting: Emergency Medicine

## 2015-02-12 ENCOUNTER — Emergency Department (HOSPITAL_COMMUNITY): Payer: Self-pay

## 2015-02-12 DIAGNOSIS — Y9339 Activity, other involving climbing, rappelling and jumping off: Secondary | ICD-10-CM | POA: Insufficient documentation

## 2015-02-12 DIAGNOSIS — S161XXA Strain of muscle, fascia and tendon at neck level, initial encounter: Secondary | ICD-10-CM

## 2015-02-12 DIAGNOSIS — Y9289 Other specified places as the place of occurrence of the external cause: Secondary | ICD-10-CM | POA: Insufficient documentation

## 2015-02-12 DIAGNOSIS — Y998 Other external cause status: Secondary | ICD-10-CM | POA: Insufficient documentation

## 2015-02-12 DIAGNOSIS — Z79899 Other long term (current) drug therapy: Secondary | ICD-10-CM | POA: Insufficient documentation

## 2015-02-12 DIAGNOSIS — Z8669 Personal history of other diseases of the nervous system and sense organs: Secondary | ICD-10-CM | POA: Insufficient documentation

## 2015-02-12 DIAGNOSIS — M25562 Pain in left knee: Secondary | ICD-10-CM

## 2015-02-12 DIAGNOSIS — W1789XA Other fall from one level to another, initial encounter: Secondary | ICD-10-CM | POA: Insufficient documentation

## 2015-02-12 DIAGNOSIS — K589 Irritable bowel syndrome without diarrhea: Secondary | ICD-10-CM | POA: Insufficient documentation

## 2015-02-12 DIAGNOSIS — F419 Anxiety disorder, unspecified: Secondary | ICD-10-CM | POA: Insufficient documentation

## 2015-02-12 DIAGNOSIS — W19XXXA Unspecified fall, initial encounter: Secondary | ICD-10-CM

## 2015-02-12 DIAGNOSIS — S8992XA Unspecified injury of left lower leg, initial encounter: Secondary | ICD-10-CM | POA: Insufficient documentation

## 2015-02-12 DIAGNOSIS — Z7982 Long term (current) use of aspirin: Secondary | ICD-10-CM | POA: Insufficient documentation

## 2015-02-12 DIAGNOSIS — M1990 Unspecified osteoarthritis, unspecified site: Secondary | ICD-10-CM | POA: Insufficient documentation

## 2015-02-12 MED ORDER — TRAMADOL HCL 50 MG PO TABS
50.0000 mg | ORAL_TABLET | Freq: Once | ORAL | Status: DC
  Filled 2015-02-12: qty 1

## 2015-02-12 MED ORDER — HYDROCODONE-ACETAMINOPHEN 5-325 MG PO TABS
1.0000 | ORAL_TABLET | Freq: Once | ORAL | Status: DC
  Filled 2015-02-12: qty 1

## 2015-02-12 MED ORDER — ONDANSETRON 4 MG PO TBDP
4.0000 mg | ORAL_TABLET | Freq: Once | ORAL | Status: AC
  Administered 2015-02-12: 4 mg via ORAL
  Filled 2015-02-12: qty 1

## 2015-02-12 MED ORDER — ONDANSETRON HCL 4 MG PO TABS: 4.0000 mg | ORAL_TABLET | Freq: Four times a day (QID) | ORAL | Status: DC

## 2015-02-12 MED ORDER — METHOCARBAMOL 500 MG PO TABS: 500.0000 mg | ORAL_TABLET | Freq: Two times a day (BID) | ORAL | Status: DC

## 2015-02-12 MED ORDER — TRAMADOL HCL 50 MG PO TABS: 50.0000 mg | ORAL_TABLET | Freq: Four times a day (QID) | ORAL | Status: DC | PRN

## 2015-02-12 NOTE — Discharge Instructions (Signed)
Follow up with Dr. Carloyn Manner for your neck pain and with Dr. Luna Glasgow for your knee pain.

## 2015-02-12 NOTE — ED Notes (Addendum)
Pt reports caught 58 year old grandson when he jumped out of a tree x3 weeks ago. Pt reports upon catching him she fell to her knees. Pt reports left knee pain,right hip pain and generalized back pain ever since.

## 2015-02-12 NOTE — ED Notes (Signed)
Pain lt foot, lower leg and knee, lt and rt hips,  And upper back,  Has had pain for  3 weeks, after catching grandson jumping out of a tree. And causing a fall onto rt side.  No LOC,  Has been taking tylenol for pain.  Has not seen any one for treatment or exam

## 2015-02-12 NOTE — ED Provider Notes (Signed)
CSN: [id number]     Arrival date & time 02/12/15  1213 History   First MD Initiated Contact with Patient 02/12/15 1226     Chief Complaint  Patient presents with  . Generalized Body Aches     (Consider location/radiation/quality/duration/timing/severity/associated sxs/prior Treatment) Patient is a 59 y.o. female presenting with fall. The history is provided by the patient.  Fall This is a new problem. The current episode started 1 to 4 weeks ago. The problem occurs constantly. The problem has been unchanged. The symptoms are aggravated by standing and walking. She has tried acetaminophen for the symptoms. The treatment provided no relief.   Destiny Young is a 59 y.o. female who presents to the ED with pain to the left foot and lower leg, left knee and right hip. Also complains of upper back and neck pain. She reports that about 3 weeks her grandson was jumping out of a tree. She tried to catch him and they both fell on her knees causing her injuries. She has been taking tylenol without relief. Woke last night with pain and felt shaky.   Past Medical History  Diagnosis Date  . High cholesterol   . GERD (gastroesophageal reflux disease)     History of Nissen fundoplication  . IBS (irritable bowel syndrome)   . Hemorrhoids   . Arthritis   . Osteoporosis   . Anxiety   . TMJ (dislocation of temporomandibular joint)   . Burning chest pain   . Ejection fraction   . History of Nissen fundoplication     5284  . Bradycardia   . Aortic insufficiency    Past Surgical History  Procedure Laterality Date  . Abdominal hysterectomy    . Shoulder surgery    . Hernia repair    . Tonsillectomy    . Nose surgery    . External ear surgery    . Abdominal surgery      hernia repair  . Esophagogastroduodenoscopy  04/14/2006    XLK:[id number] esophagus with esophagogastric junction at 36 cm from the incisors.  Intact Nissen fundoplication, otherwise normal  . Esophagogastroduodenoscopy   May 2011    Dr. Gala Romney: normal, status post intact Nissen Fundoplication, antral erosions and erythema with chronic gastritis but no H. pylori on biopsies.  . Colonoscopy  May 2011    Dr. Gala Romney: Normal terminal ileum to 10 cm, normal colonoscopy.  . Cervical ablation    . Nissen fundoplication  3474  . Esophagogastroduodenoscopy N/A 12/03/2014    Procedure: ESOPHAGOGASTRODUODENOSCOPY (EGD);  Surgeon: Daneil Dolin, MD;  Location: [facility];  Service: Endoscopy;  Laterality: N/A;  100 - moved to 1:15 - [place] to notify pt   Family History  Problem Relation Age of Onset  . Lung cancer Father   . Irritable bowel syndrome Sister   . Irritable bowel syndrome Sister   . Vascular Disease Sister    History  Substance Use Topics  . Smoking status: Never Smoker   . Smokeless tobacco: Not on file  . Alcohol Use: No   OB History    Gravida Para Term Preterm AB TAB SAB Ectopic Multiple Living   4 3   1  1   3      Review of Systems Negative except as stated in HPI   Allergies  Codeine; Meperidine hcl; Metaxalone; Naproxen; Other; Simvastatin; Sulfonamide derivatives; Tizanidine; Tramadol; and Ciprofloxacin  Home Medications   Prior to Admission medications   Medication Sig Start Date End Date Taking? Authorizing  Provider  acetaminophen (TYLENOL) 500 MG tablet Take 500 mg by mouth every 6 (six) hours as needed for pain.   Yes Historical Provider, MD  albuterol (PROVENTIL HFA;VENTOLIN HFA) 108 (90 BASE) MCG/ACT inhaler Inhale 2 puffs into the lungs every 6 (six) hours as needed. For shortness of breath   Yes Historical Provider, MD  aspirin 81 MG tablet Take 81 mg by mouth daily.   Yes Historical Provider, MD  Calcium Citrate (CITRACAL PO) Take 1 tablet by mouth 2 (two) times daily.    Yes Historical Provider, MD  diphenhydramine-acetaminophen (TYLENOL PM) 25-500 MG TABS Take 1 tablet by mouth at bedtime as needed (sleep).    Yes Historical Provider, MD  ibuprofen (ADVIL,MOTRIN) 100 MG  tablet Take 200 mg by mouth every 6 (six) hours as needed for fever.   Yes Historical Provider, MD  Linaclotide (LINZESS) 290 MCG CAPS capsule Take 1 capsule (290 mcg total) by mouth daily. 10/11/14  Yes Mahala Menghini, PA-C  Multiple Vitamin (MULTIVITAMIN WITH MINERALS) TABS Take 1 tablet by mouth daily.   Yes Historical Provider, MD  omega-3 acid ethyl esters (LOVAZA) 1 G capsule Take 1 g by mouth daily.   Yes Historical Provider, MD  dicyclomine (BENTYL) 10 MG capsule Take 1 capsule (10 mg total) by mouth 4 (four) times daily as needed for spasms (use sparingly, will cause constipation). Patient not taking: Reported on 02/12/2015 10/11/14   Mahala Menghini, PA-C  methocarbamol (ROBAXIN) 500 MG tablet Take 1 tablet (500 mg total) by mouth 2 (two) times daily. 02/12/15   Sabine Tenenbaum Bunnie Pion, NP  ondansetron (ZOFRAN) 4 MG tablet Take 1 tablet (4 mg total) by mouth every 6 (six) hours. 02/12/15   Baylin Gamblin Bunnie Pion, NP  traMADol (ULTRAM) 50 MG tablet Take 1 tablet (50 mg total) by mouth every 6 (six) hours as needed. 02/12/15   Moataz Tavis Bunnie Pion, NP   BP 134/76 mmHg  Pulse 76  Temp(Src) 98 F (36.7 C) (Oral)  Resp 18  Ht 5\' 2"  (1.575 m)  Wt 150 lb (68.04 kg)  BMI 27.43 kg/m2  SpO2 98% Physical Exam  Constitutional: She is oriented to person, place, and time. She appears well-developed and well-nourished. No distress.  HENT:  Head: Normocephalic.  Eyes: Conjunctivae and EOM are normal.  Neck: Trachea normal. Neck supple. Spinous process tenderness and muscular tenderness present. Decreased range of motion: due to pain.  Cardiovascular: Normal rate and regular rhythm.   Pulmonary/Chest: Effort normal and breath sounds normal.  Abdominal: Soft. There is no tenderness.  Musculoskeletal:       Right hip: She exhibits tenderness. She exhibits normal range of motion, normal strength and no deformity.       Left knee: She exhibits normal range of motion, no ecchymosis, no erythema, normal alignment and normal patellar  mobility. Swelling: minimal. Tenderness found. MCL and LCL tenderness noted.       Cervical back: She exhibits decreased range of motion (due to pain), tenderness and spasm. She exhibits no deformity, no laceration and normal pulse.       Back:       Legs: Pedal pulses 2+ bilateral, adequate circulation, good touch sensation, SLR without difficulty.  Tender with palpation of the cervical spine and muscles surrounding the spine. Muscle spasm noted. Radial pulses 2+ bilateral.   Neurological: She is alert and oriented to person, place, and time. No cranial nerve deficit.  Skin: Skin is warm and dry.  Psychiatric: She has a normal  mood and affect. Her behavior is normal.  Nursing note and vitals reviewed.   ED Course  Procedures (including critical care time) Labs Review Labs Reviewed - No data to display  Imaging Review Dg Cervical Spine Complete  02/12/2015   CLINICAL DATA:  Post radial neck pain, bilateral shoulder pain post fall 3 weeks ago  EXAM: CERVICAL SPINE  4+ VIEWS  COMPARISON:  Cervical spine MRI 07/23/2010  FINDINGS: Seven views of cervical spine submitted. No acute fracture. Mild degenerative changes C1-C2 articulation. There is about 2.5 mm anterolisthesis C3 on C4 vertebral body. Moderate disc space flattening with mild anterior and mild posterior spurring at C5-C6 level. Mild disc space flattening with mild anterior and mild posterior spurring at C6-C7 level. No prevertebral soft tissue swelling. Cervical airway is patent.  IMPRESSION: No acute fracture. About 2.5 mm mild anterolisthesis C3 on C4 vertebral body. Degenerative changes at C5-C6 and C6-C7 level. No prevertebral soft tissue swelling. Cervical airway is patent.   Electronically Signed   By: Lahoma Crocker M.D.   On: 02/12/2015 14:07   Dg Knee Complete 4 Views Left  02/12/2015   CLINICAL DATA:  Left knee pain post fall 3 weeks ago, right hip pain  EXAM: LEFT KNEE - COMPLETE 4+ VIEW  COMPARISON:  09/04/2010  FINDINGS: Four  views of the left knee submitted. No acute fracture or subluxation. Mild narrowing of medial joint compartment. Minimal spurring of medial femoral condyle and medial tibial plateau. No joint effusion.  IMPRESSION: No acute fracture or subluxation.  Mild degenerative changes.   Electronically Signed   By: Lahoma Crocker M.D.   On: 02/12/2015 14:04   Dg Hip Unilat With Pelvis 2-3 Views Right  02/12/2015   CLINICAL DATA:  Right hip pain post fall 3 weeks ago, twisted hip, posterior hip pain  EXAM: RIGHT HIP (WITH PELVIS) 2-3 VIEWS  COMPARISON:  None.  FINDINGS: Three views of the right hip submitted. No acute fracture or subluxation. No radiopaque foreign body. Bilateral hip joints are symmetrical in appearance.  IMPRESSION: Negative.   Electronically Signed   By: Lahoma Crocker M.D.   On: 02/12/2015 14:02    MDM  59 y.o. female with left knee, neck and hip pain s/p fall 3 weeks ago. Stable for d/c without fractures or dislocations and no neurovascular compromise. Will treat for pain and muscle spasm. She will follow up with Dr. Carloyn Manner who she has seen in the past for chronic neck pain and with ortho if knee pain persist.  Tramadol, muscle relaxant,ice and rest.  Final diagnoses:  Fall  Left knee pain  Cervical strain, acute, initial encounter       [facility], NP 02/12/15 [place], MD 02/12/15 1527

## 2015-02-12 NOTE — ED Notes (Signed)
Pt is driving, unable to give tramadol

## 2015-02-14 ENCOUNTER — Other Ambulatory Visit: Payer: Self-pay

## 2015-02-14 DIAGNOSIS — K8050 Calculus of bile duct without cholangitis or cholecystitis without obstruction: Secondary | ICD-10-CM

## 2015-02-22 ENCOUNTER — Other Ambulatory Visit: Payer: Self-pay

## 2015-02-22 DIAGNOSIS — R1011 Right upper quadrant pain: Secondary | ICD-10-CM

## 2015-06-21 ENCOUNTER — Telehealth: Payer: Self-pay | Admitting: Internal Medicine

## 2015-06-21 NOTE — Telephone Encounter (Signed)
I spoke with [facility], they are in the process of getting a new computer system and they are having to go back and recheck all the referral from this time period. pts tcs report states she needs a repeat tcs in 2 weeks. Advised her that this is likely a typo.  I went over the results with her and the letter [facility] sent and it states she was due for another tcs in 10 years.  I told her that I would review this with [facility]MR next week and if there is anything different that I would call her back. She was in agreement with this.

## 2015-06-21 NOTE — Telephone Encounter (Signed)
PLEASE CALL [facility] AT [place] [phone] REGARDING 2011 TCS

## 2015-06-25 NOTE — Telephone Encounter (Signed)
I spoke with RMR and showed him all of the reports. He said it was a typo and that it should have been 10 years. Pt is on the recall for 10 years from the date of the tcs. When I spoke with [facility] she said the pt is not having any problems right now they are just following up on things where they are going to a new system.

## 2015-08-20 ENCOUNTER — Emergency Department (HOSPITAL_COMMUNITY): Payer: Medicaid Other

## 2015-08-20 ENCOUNTER — Encounter (HOSPITAL_COMMUNITY): Payer: Self-pay

## 2015-08-20 ENCOUNTER — Emergency Department (HOSPITAL_COMMUNITY)
Admission: EM | Admit: 2015-08-20 | Discharge: 2015-08-20 | Disposition: A | Discharge status: Home / Self Care | Payer: Medicaid Other | Attending: Emergency Medicine | Admitting: Emergency Medicine

## 2015-08-20 DIAGNOSIS — R1084 Generalized abdominal pain: Secondary | ICD-10-CM | POA: Insufficient documentation

## 2015-08-20 DIAGNOSIS — M545 Low back pain: Secondary | ICD-10-CM | POA: Diagnosis not present

## 2015-08-20 DIAGNOSIS — R200 Anesthesia of skin: Secondary | ICD-10-CM | POA: Diagnosis not present

## 2015-08-20 DIAGNOSIS — M1990 Unspecified osteoarthritis, unspecified site: Secondary | ICD-10-CM | POA: Insufficient documentation

## 2015-08-20 DIAGNOSIS — R079 Chest pain, unspecified: Secondary | ICD-10-CM

## 2015-08-20 DIAGNOSIS — Z79899 Other long term (current) drug therapy: Secondary | ICD-10-CM | POA: Diagnosis not present

## 2015-08-20 DIAGNOSIS — Z8659 Personal history of other mental and behavioral disorders: Secondary | ICD-10-CM | POA: Insufficient documentation

## 2015-08-20 DIAGNOSIS — R0602 Shortness of breath: Secondary | ICD-10-CM | POA: Insufficient documentation

## 2015-08-20 DIAGNOSIS — K219 Gastro-esophageal reflux disease without esophagitis: Secondary | ICD-10-CM | POA: Diagnosis not present

## 2015-08-20 DIAGNOSIS — Z8679 Personal history of other diseases of the circulatory system: Secondary | ICD-10-CM | POA: Diagnosis not present

## 2015-08-20 DIAGNOSIS — Z8639 Personal history of other endocrine, nutritional and metabolic disease: Secondary | ICD-10-CM | POA: Insufficient documentation

## 2015-08-20 LAB — COMPREHENSIVE METABOLIC PANEL
ALBUMIN: 4.1 g/dL (ref 3.5–5.0)
ALK PHOS: 64 U/L (ref 38–126)
ALT: 20 U/L (ref 14–54)
ANION GAP: 8 (ref 5–15)
AST: 23 U/L (ref 15–41)
BUN: 14 mg/dL (ref 6–20)
CALCIUM: 9.4 mg/dL (ref 8.9–10.3)
CO2: 26 mmol/L (ref 22–32)
Chloride: 105 mmol/L (ref 101–111)
Creatinine, Ser: 0.85 mg/dL (ref 0.44–1.00)
GFR calc Af Amer: >60 mL/min (ref >60)
GFR calc non Af Amer: >60 mL/min (ref >60)
Glucose, Bld: 100 mg/dL — ABNORMAL HIGH (ref 65–99)
Potassium: 3.7 mmol/L (ref 3.5–5.1)
Sodium: 139 mmol/L (ref 135–145)
TOTAL PROTEIN: 7.2 g/dL (ref 6.5–8.1)
Total Bilirubin: 0.4 mg/dL (ref 0.3–1.2)

## 2015-08-20 LAB — CBC WITH DIFFERENTIAL/PLATELET
BASOS ABS: 0 10*3/uL (ref 0.0–0.1)
BASOS PCT: 0 %
EOS PCT: 1 %
Eosinophils Absolute: 0.1 10*3/uL (ref 0.0–0.7)
HCT: 35.8 % — ABNORMAL LOW (ref 36.0–46.0)
Hemoglobin: 11.9 g/dL — ABNORMAL LOW (ref 12.0–15.0)
Lymphocytes Relative: 44 %
Lymphs Abs: 2.7 10*3/uL (ref 0.7–4.0)
MCH: 30.7 pg (ref 26.0–34.0)
MCHC: 33.2 g/dL (ref 30.0–36.0)
MCV: 92.3 fL (ref 78.0–100.0)
MONO ABS: 0.5 10*3/uL (ref 0.1–1.0)
Monocytes Relative: 7 %
Neutro Abs: 2.9 10*3/uL (ref 1.7–7.7)
Neutrophils Relative %: 48 %
Platelets: 287 10*3/uL (ref 150–400)
RBC: 3.88 MIL/uL (ref 3.87–5.11)
RDW: 12.3 % (ref 11.5–15.5)
WBC: 6.1 10*3/uL (ref 4.0–10.5)

## 2015-08-20 LAB — D-DIMER, QUANTITATIVE (NOT AT ARMC)

## 2015-08-20 LAB — TROPONIN I
Troponin I: <0.03 ng/mL (ref <0.031)
Troponin I: <0.03 ng/mL (ref <0.031)

## 2015-08-20 MED ORDER — FAMOTIDINE 20 MG PO TABS
20.0000 mg | ORAL_TABLET | Freq: Once | ORAL | Status: AC
  Administered 2015-08-20: 20 mg via ORAL
  Filled 2015-08-20: qty 1

## 2015-08-20 MED ORDER — ACETAMINOPHEN 325 MG PO TABS
650.0000 mg | ORAL_TABLET | Freq: Once | ORAL | Status: AC
  Administered 2015-08-20: 650 mg via ORAL
  Filled 2015-08-20: qty 2

## 2015-08-20 NOTE — Discharge Instructions (Signed)

## 2015-08-20 NOTE — ED Provider Notes (Signed)
CSN: [id number]     Arrival date & time 08/20/15  1824 History  By signing my name below, I, Helane Gunther, attest that this documentation has been prepared under the direction and in the presence of Ripley Fraise, MD. Electronically Signed: Helane Gunther, ED Scribe. 08/20/2015. 8:48 PM.    Chief Complaint  Patient presents with  . Chest Pain   Patient is a 59 y.o. female presenting with chest pain. The history is provided by the patient. No language interpreter was used.  Chest Pain Pain location:  L chest Pain quality: sharp and tearing   Pain radiates to:  Does not radiate Pain radiates to the back: no   Pain severity:  Moderate Onset quality:  Sudden Timing:  Constant Progression:  Waxing and waning Chronicity:  New Context: at rest   Worsened by:  Deep breathing Associated symptoms: abdominal pain, back pain, numbness and shortness of breath   Associated symptoms: no fever, no nausea and not vomiting   Abdominal pain:    Location:  Generalized   Severity:  Mild   Onset quality:  Sudden   Chronicity:  New Risk factors: high cholesterol   Risk factors: no smoking    HPI Comments: KASHAY CAVENAUGH is a 59 y.o. female with a PMHx of GERD and high cholesterol who presents to the Emergency Department complaining of waxing and waning, pressure-like, left-sided, non-radiating, sharp, tearing chest pain onset at 5:46 AM this morning. She reports associated SOB, heart burn, heaviness and tingling in the legs, ankle and knee pain, arm tingling, tingling in her neck, and eventually tingling all over her body. She also notes abdominal pain with bending over. She also reports muscle spasms along her back  with deep breathing, and when lifting her arms over her head. She notes she took Tum's for the heart burn with mild relief. Pt states she has had chest pain before, for which she was seen and underwent a stress test, which came back normal, but notes that that pain felt different that  the current pain. She also states she was seen in the ED for a referral to a cholecystectomy and simultaneous flipping of her stomach, but states she was unable to find a surgeon who would perform both procedures. She reports a FHx of heart problems (sister and grandparents). She denies a PMHx of MI, stroke, and PE. She denies any other medical issues. Pt denies fever and n/v.  Past Medical History  Diagnosis Date  . High cholesterol   . GERD (gastroesophageal reflux disease)     History of Nissen fundoplication  . IBS (irritable bowel syndrome)   . Hemorrhoids   . Arthritis   . Osteoporosis   . Anxiety   . TMJ (dislocation of temporomandibular joint)   . Burning chest pain   . Ejection fraction   . History of Nissen fundoplication     1448  . Bradycardia   . Aortic insufficiency    Past Surgical History  Procedure Laterality Date  . Abdominal hysterectomy    . Shoulder surgery    . Hernia repair    . Tonsillectomy    . Nose surgery    . External ear surgery    . Abdominal surgery      hernia repair  . Esophagogastroduodenoscopy  04/14/2006    [id number] esophagus with esophagogastric junction at 36 cm from the incisors.  Intact Nissen fundoplication, otherwise normal  . Esophagogastroduodenoscopy  May 2011    Dr. Gala Romney: normal, status post  intact Nissen Fundoplication, antral erosions and erythema with chronic gastritis but no H. pylori on biopsies.  . Colonoscopy  May 2011    Dr. Gala Romney: Normal terminal ileum to 10 cm, normal colonoscopy.  . Cervical ablation    . Nissen fundoplication  3762  . Esophagogastroduodenoscopy N/A 12/03/2014    Procedure: ESOPHAGOGASTRODUODENOSCOPY (EGD);  Surgeon: Daneil Dolin, MD;  Location: [facility];  Service: Endoscopy;  Laterality: N/A;  100 - moved to 1:15 - Ginger to notify pt   Family History  Problem Relation Age of Onset  . Lung cancer Father   . Irritable bowel syndrome Sister   . Irritable bowel syndrome Sister   .  Vascular Disease Sister    Social History  Substance Use Topics  . Smoking status: Never Smoker   . Smokeless tobacco: None  . Alcohol Use: No   OB History    Gravida Para Term Preterm AB TAB SAB Ectopic Multiple Living   4 3   1  1   3      Review of Systems  Constitutional: Negative for fever.  Respiratory: Positive for shortness of breath.   Cardiovascular: Positive for chest pain.  Gastrointestinal: Positive for abdominal pain. Negative for nausea and vomiting.  Musculoskeletal: Positive for back pain and arthralgias.  Neurological: Positive for numbness.  All other systems reviewed and are negative.   Allergies  Codeine; Meperidine hcl; Metaxalone; Naproxen; Other; Simvastatin; Sulfonamide derivatives; Tizanidine; Tramadol; and Ciprofloxacin  Home Medications   Prior to Admission medications   Medication Sig Start Date End Date Taking? Authorizing Provider  acetaminophen (TYLENOL) 500 MG tablet Take 500 mg by mouth every 6 (six) hours as needed for pain.   Yes Historical Provider, MD  albuterol (PROVENTIL HFA;VENTOLIN HFA) 108 (90 BASE) MCG/ACT inhaler Inhale 2 puffs into the lungs every 6 (six) hours as needed. For shortness of breath   Yes Historical Provider, MD  calcium carbonate (TUMS - DOSED IN MG ELEMENTAL CALCIUM) 500 MG chewable tablet Chew 1-2 tablets by mouth daily as needed for indigestion or heartburn.   Yes Historical Provider, MD  Calcium Citrate (CITRACAL PO) Take 1 tablet by mouth 2 (two) times daily.    Yes Historical Provider, MD  diphenhydrAMINE (BENADRYL) 25 MG tablet Take 25 mg by mouth daily as needed for allergies.   Yes Historical Provider, MD  doxylamine, Sleep, (SLEEP AID) 25 MG tablet Take 25 mg by mouth at bedtime as needed for sleep.   Yes Historical Provider, MD  ibuprofen (ADVIL,MOTRIN) 200 MG tablet Take 200 mg by mouth every 6 (six) hours as needed for mild pain or moderate pain.   Yes Historical Provider, MD  Magnesium Chloride (MAGNESIUM DR  PO) Take 1 tablet by mouth daily.   Yes Historical Provider, MD  Multiple Vitamin (MULTIVITAMIN WITH MINERALS) TABS Take 1 tablet by mouth daily.   Yes Historical Provider, MD  Omega-3 Fatty Acids (FISH OIL) 1000 MG CAPS Take 2 capsules by mouth daily.   Yes Historical Provider, MD  simethicone (GAS RELIEF) 80 MG chewable tablet Chew 80-160 mg by mouth daily as needed for flatulence.   Yes Historical Provider, MD  vitamin C (ASCORBIC ACID) 500 MG tablet Take 500 mg by mouth daily.   Yes Historical Provider, MD   BP 138/73 mmHg  Pulse 65  Temp(Src) 98.4 F (36.9 C) (Oral)  Resp 16  Ht 5\' 2"  (1.575 m)  Wt 148 lb (67.132 kg)  BMI 27.06 kg/m2  SpO2 100% Physical Exam  CONSTITUTIONAL: Well developed/well nourished HEAD: Normocephalic/atraumatic EYES: EOMI/PERRL ENMT: Mucous membranes moist NECK: supple no meningeal signs SPINE/BACK:entire spine nontender CV: S1/S2 noted, no murmurs/rubs/gallops noted LUNGS: Lungs are clear to auscultation bilaterally, no apparent distress ABDOMEN: soft, nontender, no rebound or guarding, bowel sounds noted throughout abdomen GU:no cva tenderness NEURO: Pt is awake/alert/appropriate, moves all extremitiesx4.  No facial droop. No arm, or leg drift. No sensory deficit to extremities.  No ataxia.  No difficulty ambulating EXTREMITIES: pulses normal/equalx4, full ROM SKIN: warm, color normal PSYCH: no abnormalities of mood noted, alert and oriented to situation  ED Course  Procedures  Medications  acetaminophen (TYLENOL) tablet 650 mg (650 mg Oral Given 08/20/15 2232)  famotidine (PEPCID) tablet 20 mg (20 mg Oral Given 08/20/15 2232)    DIAGNOSTIC STUDIES: Oxygen Saturation is 100% on RA, normal by my interpretation.    COORDINATION OF CARE: 8:44 PM - Discussed normal chest XR. Discussed plans to wait on remaining diagnostic studies. Pt advised of plan for treatment and pt agrees.  Pt with multiple complaints She initially reported tearing CP, then  reported sharp CP.  She also reported spasms and heaviness.   Workup negative for PE.  I doubt ACS given history/exam I feel she is unlikely to have aortic dissection with no HTN, pulses equalx4, and CXR normal. Also reported diffuse weakness-  Low suspicion for CVA I don't feel further workup required at this time Discussed need for PCP f/u We also discussed strict ER return precautions   Labs Review Labs Reviewed  CBC WITH DIFFERENTIAL/PLATELET - Abnormal; Notable for the following:    Hemoglobin 11.9 (*)    HCT 35.8 (*)    All other components within normal limits  COMPREHENSIVE METABOLIC PANEL - Abnormal; Notable for the following:    Glucose, Bld 100 (*)    All other components within normal limits  TROPONIN I  D-DIMER, QUANTITATIVE (NOT AT [facility])  TROPONIN I    Imaging Review Dg Chest 2 View  08/20/2015  CLINICAL DATA:  Pt says woke up at 0546 this am and had pressure in left side of chest and started getting heart burn. Also says was having muscle spasms in r side of neck and jaw. EXAM: CHEST  2 VIEW COMPARISON:  11/12/2011 FINDINGS: Cardiac silhouette is normal in size and configuration. No mediastinal or hilar masses or evidence of adenopathy. There is minor scarring at the apices, stable. Lungs are otherwise clear. No pleural effusion or pneumothorax. Bony thorax is demineralized but intact. IMPRESSION: No active cardiopulmonary disease. Electronically Signed   By: Lajean Manes M.D.   On: 08/20/2015 19:00   I have personally reviewed and evaluated these images and lab results as part of my medical decision-making.   EKG Interpretation   Date/Time:  Tuesday August 20 2015 18:27:57 EDT Ventricular Rate:  80 PR Interval:  164 QRS Duration: 78 QT Interval:  376 QTC Calculation: 433 R Axis:   -5 Text Interpretation:  Normal sinus rhythm Minimal voltage criteria for  LVH, may be normal variant Anterior infarct , age undetermined Abnormal  ECG No significant change since  last tracing Confirmed by Christy Gentles  MD,  [place] ([id number]) on 08/20/2015 6:33:59 PM      MDM   Final diagnoses:  Chest pain, unspecified chest pain type    Nursing notes including past medical history and social history reviewed and considered in documentation xrays/imaging reviewed by myself and considered during evaluation Labs/vital reviewed myself and considered during evaluation   I personally  performed the services described in this documentation, which was scribed in my presence. The recorded information has been reviewed and is accurate.       Ripley Fraise, MD 08/20/15 [phone]

## 2015-08-20 NOTE — ED Notes (Signed)
Pt reports Friday she was helping move some things around in a garage.  Reports started having some pain in abd.  Reports has history of hernia in 2000.  Pt says the more she raised her arms over her head, the pain got worse and hernia started to swell.  Pt says felt sick all weekend and had a lot of pressure in upper abd.  Pt says started having tingling in both legs yesterday, worse in the left than the right.  Also swelling and pain in left ankle and pain in left thigh.  Pt also c/o numbness in left side of back since June.  Pt says doesn't have a doctor or any insurance so she hasn't been able to see a doctor.  Pt says woke up at 0546 this am and had pressure in left side of chest and started getting heart burn.  Also says was having muscle spasms in r side of neck and jaw.

## 2015-09-16 ENCOUNTER — Ambulatory Visit
Admission: RE | Admit: 2015-09-16 | Discharge: 2015-09-16 | Disposition: A | Discharge status: Home / Self Care | Payer: Disability Insurance | Source: Ambulatory Visit | Attending: Obstetrics and Gynecology | Admitting: Obstetrics and Gynecology

## 2015-09-16 ENCOUNTER — Other Ambulatory Visit: Payer: Self-pay | Admitting: Obstetrics and Gynecology

## 2015-09-16 DIAGNOSIS — G8929 Other chronic pain: Secondary | ICD-10-CM | POA: Insufficient documentation

## 2015-09-16 DIAGNOSIS — M419 Scoliosis, unspecified: Secondary | ICD-10-CM

## 2015-09-16 DIAGNOSIS — M545 Low back pain: Secondary | ICD-10-CM

## 2015-12-05 ENCOUNTER — Ambulatory Visit (INDEPENDENT_AMBULATORY_CARE_PROVIDER_SITE_OTHER): Payer: Medicaid Other | Admitting: Adult Health

## 2015-12-05 ENCOUNTER — Encounter: Payer: Self-pay | Admitting: Adult Health

## 2015-12-05 VITALS — BP 132/70 | HR 78 | Ht 62.0 in | Wt 147.0 lb

## 2015-12-05 DIAGNOSIS — K5900 Constipation, unspecified: Secondary | ICD-10-CM

## 2015-12-05 DIAGNOSIS — F419 Anxiety disorder, unspecified: Secondary | ICD-10-CM

## 2015-12-05 DIAGNOSIS — Z0000 Encounter for general adult medical examination without abnormal findings: Secondary | ICD-10-CM

## 2015-12-05 DIAGNOSIS — Z01419 Encounter for gynecological examination (general) (routine) without abnormal findings: Secondary | ICD-10-CM

## 2015-12-05 DIAGNOSIS — N816 Rectocele: Secondary | ICD-10-CM

## 2015-12-05 DIAGNOSIS — R6881 Early satiety: Secondary | ICD-10-CM

## 2015-12-05 DIAGNOSIS — N8189 Other female genital prolapse: Secondary | ICD-10-CM

## 2015-12-05 DIAGNOSIS — Z1211 Encounter for screening for malignant neoplasm of colon: Secondary | ICD-10-CM

## 2015-12-05 DIAGNOSIS — L918 Other hypertrophic disorders of the skin: Secondary | ICD-10-CM

## 2015-12-05 DIAGNOSIS — R102 Pelvic and perineal pain: Secondary | ICD-10-CM

## 2015-12-05 HISTORY — DX: Rectocele: N81.6

## 2015-12-05 HISTORY — DX: Other hypertrophic disorders of the skin: L91.8

## 2015-12-05 HISTORY — DX: Pelvic and perineal pain: R10.2

## 2015-12-05 HISTORY — DX: Other female genital prolapse: N81.89

## 2015-12-05 LAB — HEMOCCULT GUIAC POC 1CARD (OFFICE): FECAL OCCULT BLD: NEGATIVE

## 2015-12-05 MED ORDER — ESCITALOPRAM OXALATE 10 MG PO TABS: 10.0000 mg | ORAL_TABLET | Freq: Every day | ORAL | Status: DC

## 2015-12-05 NOTE — Progress Notes (Signed)
Patient ID: Destiny Young, female   DOB: [birth year], 60 y.o.   MRN: [id number] History of Present Illness: Destiny Young is a 60 year old white female, widowed in for gyn exam and complains of pelvic pain, constipation, early satiety and ?mass in perineum, was referred by [facility].She says husband died 2015/06/18 and she has been stressed since then and is not sleeping well, feels anxious not depressed.She has seen Dr Gala Romney in past.She is sp hysterectomy by Dr Glo Herring years ago.She says needs GB out and stomach flipped. She is disabled for spine back and hip problems. She denies any suicidal ideations. PCP [facility].  Current Medications, Allergies, Past Medical History, Past Surgical History, Family History and Social History were reviewed in [employer] record.     Review of Systems: Patient denies any headaches, hearing loss, fatigue, blurred vision, shortness of breath, chest pain, problems with urination, or intercourse(not having sex). No joint pain or mood swings. See HPI for positives.   Physical Exam:BP 132/70 mmHg  Pulse 78  Ht 5\' 2"  (1.575 m)  Wt 147 lb (66.679 kg)  BMI 26.88 kg/m2 General:  Well developed, well nourished, no acute distress Skin:  Warm and dry Neck:  Midline trachea, normal thyroid, good ROM, no lymphadenopathy Lungs; Clear to auscultation bilaterally Breast:  No dominant palpable mass, retraction, or nipple discharge Cardiovascular: Regular rate and rhythm Abdomen:  Soft, mildly tender, no hepatosplenomegaly Pelvic:  External genitalia is normal in appearance, no lesions.  The vagina has decreased color, moisture and rugae. Urethra has no lesions or masses noted.The cervix and uterus are absent.Has pelvic relaxation and skin posteriorly since inside vagina, it is firm and about the size of a pea.. No adnexal masses or tenderness noted.Bladder is non tender, no masses felt. Rectal: Good sphincter tone, no polyps, or hemorrhoids felt.  Hemoccult  negative.+rectocele and has extra skin at anal area Extremities/musculoskeletal:  No swelling or varicosities noted, no clubbing or cyanosis Psych:  No mood changes, alert and cooperative,seems anxious Discussed that pessary may help, will make appt with Dr Glo Herring.  Impression: GYN exam with no pap Pelvic relaxation Rectocele  Constipation Skin tag  Pelvic pain Anxiety  Early satiety     Plan: Rx lexapro 10 mg #30 take 1/2 tab daily for 1 week then 1 daily with 6 refills Return in 1 week to see Dr Glo Herring to see if pessary may help Follow up in 6 weeks on lexapro Physical in 1 year Mammogram yearly Review handout on anxiety  Get [facility] to send back to Dr Gala Romney

## 2015-12-05 NOTE — Patient Instructions (Addendum)
Physical in 1 year Follow up in 1 week with Dr Glo Herring  Get mammogram  Follow up with me in about 6 weeks Generalized Anxiety Disorder Generalized anxiety disorder (GAD) is a mental disorder. It interferes with life functions, including relationships, work, and school. GAD is different from normal anxiety, which everyone experiences at some point in their lives in response to specific life events and activities. Normal anxiety actually helps [place] prepare for and get through these life events and activities. Normal anxiety goes away after the event or activity is over.  GAD causes anxiety that is not necessarily related to specific events or activities. It also causes excess anxiety in proportion to specific events or activities. The anxiety associated with GAD is also difficult to control. GAD can vary from mild to severe. People with severe GAD can have intense waves of anxiety with physical symptoms (panic attacks).  SYMPTOMS The anxiety and worry associated with GAD are difficult to control. This anxiety and worry are related to many life events and activities and also occur more days than not for 6 months or longer. People with GAD also have three or more of the following symptoms (one or more in children):  Restlessness.   Fatigue.  Difficulty concentrating.   Irritability.  Muscle tension.  Difficulty sleeping or unsatisfying sleep. DIAGNOSIS GAD is diagnosed through an assessment by your health care provider. Your health care provider will ask you questions aboutyour mood,physical symptoms, and events in your life. Your health care provider may ask you about your medical history and use of alcohol or drugs, including prescription medicines. Your health care provider may also do a physical exam and blood tests. Certain medical conditions and the use of certain substances can cause symptoms similar to those associated with GAD. Your health care provider may refer you to a mental health  specialist for further evaluation. TREATMENT The following therapies are usually used to treat GAD:   Medication. Antidepressant medication usually is prescribed for long-term daily control. Antianxiety medicines may be added in severe cases, especially when panic attacks occur.   Talk therapy (psychotherapy). Certain types of talk therapy can be helpful in treating GAD by providing support, education, and guidance. A form of talk therapy called cognitive behavioral therapy can teach you healthy ways to think about and react to daily life events and activities.  Stress managementtechniques. These include yoga, meditation, and exercise and can be very helpful when they are practiced regularly. A mental health specialist can help determine which treatment is best for you. Some people see improvement with one therapy. However, other people require a combination of therapies.   This information is not intended to replace advice given to you by your health care provider. Make sure you discuss any questions you have with your health care provider.   Document Released: 02/06/2013 Document Revised: 11/02/2014 Document Reviewed: 02/06/2013 Elsevier Interactive Patient Education [employer].

## 2015-12-06 ENCOUNTER — Other Ambulatory Visit: Payer: Self-pay | Admitting: Obstetrics and Gynecology

## 2015-12-06 DIAGNOSIS — Z1231 Encounter for screening mammogram for malignant neoplasm of breast: Secondary | ICD-10-CM

## 2015-12-12 ENCOUNTER — Ambulatory Visit: Payer: Medicaid Other | Admitting: Obstetrics and Gynecology

## 2015-12-12 ENCOUNTER — Other Ambulatory Visit (HOSPITAL_COMMUNITY): Payer: Self-pay | Admitting: Physical Medicine and Rehabilitation

## 2015-12-12 DIAGNOSIS — M50322 Other cervical disc degeneration at C5-C6 level: Secondary | ICD-10-CM

## 2015-12-12 DIAGNOSIS — M549 Dorsalgia, unspecified: Secondary | ICD-10-CM

## 2015-12-12 DIAGNOSIS — M546 Pain in thoracic spine: Secondary | ICD-10-CM

## 2015-12-19 ENCOUNTER — Ambulatory Visit (HOSPITAL_COMMUNITY)
Admission: RE | Admit: 2015-12-19 | Discharge: 2015-12-19 | Disposition: A | Discharge status: Home / Self Care | Payer: Medicaid Other | Source: Ambulatory Visit | Attending: Obstetrics and Gynecology | Admitting: Obstetrics and Gynecology

## 2015-12-19 ENCOUNTER — Encounter: Payer: Self-pay | Admitting: Adult Health

## 2015-12-19 ENCOUNTER — Ambulatory Visit (INDEPENDENT_AMBULATORY_CARE_PROVIDER_SITE_OTHER): Payer: Medicaid Other | Admitting: Obstetrics and Gynecology

## 2015-12-19 VITALS — BP 132/78 | HR 68 | Ht 62.0 in | Wt 149.2 lb

## 2015-12-19 DIAGNOSIS — Z1231 Encounter for screening mammogram for malignant neoplasm of breast: Secondary | ICD-10-CM | POA: Insufficient documentation

## 2015-12-19 DIAGNOSIS — R252 Cramp and spasm: Secondary | ICD-10-CM

## 2015-12-19 DIAGNOSIS — N816 Rectocele: Secondary | ICD-10-CM

## 2015-12-19 DIAGNOSIS — R102 Pelvic and perineal pain: Secondary | ICD-10-CM | POA: Diagnosis not present

## 2015-12-19 NOTE — Progress Notes (Signed)
Patient ID: Destiny Young, female   DOB: 09-Mar-1956, 60 y.o.   MRN: [id number]   [place] [facility] Visit  Patient name: Destiny Young MRN [id number]  Date of birth: 08/25/1956  CC & HPI:  Destiny Young is a 59 y.o. female with h/o recurrent rectocele presenting today for moderate, ongoing pelvic pain and cramping. Pt states that her previous posterior repair in 1999 provided little relief. She reports a sensation that her vaginal walls are low. She denies postcoital vaginal bleeding. Pt reports she is not sexually active. She reports her abdominal pain is constant. Pt notes her pains are similar to menstrual cramping. Pt denies UI.   ROS:  A complete 10 system review of systems was obtained and all systems are negative except as noted in the HPI and PMH.    Pertinent History Reviewed:   Reviewed: Significant for partial hysterectomy with posterior repair Medical         Past Medical History  Diagnosis Date  . High cholesterol   . GERD (gastroesophageal reflux disease)     History of Nissen fundoplication  . IBS (irritable bowel syndrome)   . Hemorrhoids   . Arthritis   . Osteoporosis   . Anxiety   . TMJ (dislocation of temporomandibular joint)   . Burning chest pain   . Ejection fraction   . History of Nissen fundoplication     [id number]  . Bradycardia   . Aortic insufficiency   . Rectocele 12/05/2015  . Pelvic relaxation 12/05/2015  . Skin tag 12/05/2015  . Pelvic pain in female 12/05/2015                              Surgical Hx:    Past Surgical History  Procedure Laterality Date  . Abdominal hysterectomy    . Shoulder surgery    . Hernia repair    . Tonsillectomy    . Nose surgery    . External ear surgery    . Abdominal surgery      hernia repair  . Esophagogastroduodenoscopy  04/14/2006    [id number] esophagus with esophagogastric junction at 36 cm from the incisors.  Intact Nissen fundoplication, otherwise normal  . Esophagogastroduodenoscopy  May  2011    Dr. Gala Romney: normal, status post intact Nissen Fundoplication, antral erosions and erythema with chronic gastritis but no H. pylori on biopsies.  . Colonoscopy  May 2011    Dr. Gala Romney: Normal terminal ileum to 10 cm, normal colonoscopy.  . Cervical ablation    . Nissen fundoplication  [id number]  . Esophagogastroduodenoscopy N/A 12/03/2014    Procedure: ESOPHAGOGASTRODUODENOSCOPY (EGD);  Surgeon: Daneil Dolin, MD;  Location: [facility];  Service: Endoscopy;  Laterality: N/A;  100 - moved to 1:15 - Ginger to notify pt   Medications: Reviewed & Updated - see associated section                       Current outpatient prescriptions:  .  acetaminophen (TYLENOL) 500 MG tablet, Take 500 mg by mouth every 6 (six) hours as needed for pain., Disp: , Rfl:  .  albuterol (PROVENTIL HFA;VENTOLIN HFA) 108 (90 BASE) MCG/ACT inhaler, Inhale 2 puffs into the lungs every 6 (six) hours as needed. For shortness of breath, Disp: , Rfl:  .  aspirin 81 MG tablet, Take 81 mg by mouth daily., Disp: , Rfl:  .  calcium carbonate (TUMS -  DOSED IN MG ELEMENTAL CALCIUM) 500 MG chewable tablet, Chew 1-2 tablets by mouth daily as needed for indigestion or heartburn., Disp: , Rfl:  .  Calcium Citrate (CITRACAL PO), Take 1 tablet by mouth 2 (two) times daily. , Disp: , Rfl:  .  diphenhydrAMINE (BENADRYL) 25 MG tablet, Take 25 mg by mouth daily as needed for allergies., Disp: , Rfl:  .  doxylamine, Sleep, (SLEEP AID) 25 MG tablet, Take 25 mg by mouth at bedtime as needed for sleep., Disp: , Rfl:  .  escitalopram (LEXAPRO) 10 MG tablet, Take 1 tablet (10 mg total) by mouth daily., Disp: 30 tablet, Rfl: 6 .  ibuprofen (ADVIL,MOTRIN) 200 MG tablet, Take 200 mg by mouth every 6 (six) hours as needed for mild pain or moderate pain., Disp: , Rfl:  .  Magnesium Chloride (MAGNESIUM DR PO), Take 1 tablet by mouth daily., Disp: , Rfl:  .  Multiple Vitamin (MULTIVITAMIN WITH MINERALS) TABS, Take 1 tablet by mouth daily., Disp: , Rfl:  .   Omega-3 Fatty Acids (FISH OIL) 1000 MG CAPS, Take 1 capsule by mouth daily. , Disp: , Rfl:  .  omeprazole (PRILOSEC) 10 MG capsule, Take 10 mg by mouth daily., Disp: , Rfl:  .  Probiotic Product (PROBIOTIC PO), Take by mouth daily., Disp: , Rfl:  .  simethicone (GAS RELIEF) 80 MG chewable tablet, Chew 80-160 mg by mouth daily as needed for flatulence., Disp: , Rfl:  .  vitamin C (ASCORBIC ACID) 500 MG tablet, Take 500 mg by mouth daily., Disp: , Rfl:    Social History: Reviewed -  reports that she has never smoked. She has never used smokeless tobacco.  Objective Findings:  Vitals: Blood pressure 132/78, pulse 68, height 5\' 2"  (1.575 m), weight 149 lb 3.2 oz (67.677 kg).  Physical Examination: Abdomen - soft, nontender, nondistended, no masses or organomegaly Pelvic - normal external genitalia  VULVA: normal appearing vulva with no masses, tenderness or lesions,  VAGINA: normal appearing vagina with normal color and discharge, no lesions, moderate persistent rectocele. Decreased apical support, dynamic movement of vaginal support tissues.  CERVIX: absent, removed surgically  UTERUS: absent, removed surgically   ADNEXA: no palpable organs    Assessment & Plan:   A:  1. Moderate recurrent rectocele 2. Dynamic movement of vaginal support tissues.  3. Even small pessaries are difficult to remove. Movement of the vaginal apex causes the pessary to move down the vagina, causing discomfort. Not a pessary candidate.   P:  1. Referral for loss of apical support  May be candidate for sacrospinous suspension     By signing my name below, I, Hansel Feinstein, attest that this documentation has been prepared under the direction and in the presence of Jonnie Kind, MD. Electronically Signed: Hansel Feinstein, ED Scribe. 12/19/2015. 1:56 PM.  I personally performed the services described in this documentation, which was SCRIBED in my presence. The recorded information has been reviewed and considered  accurate. It has been edited as necessary during review. Jonnie Kind, MD   I personally performed the services described in this documentation, which was SCRIBED in my presence. The recorded information has been reviewed and considered accurate. It has been edited as necessary during review. Jonnie Kind, MD

## 2015-12-24 ENCOUNTER — Ambulatory Visit (HOSPITAL_COMMUNITY)
Admission: RE | Admit: 2015-12-24 | Discharge: 2015-12-24 | Disposition: A | Discharge status: Home / Self Care | Payer: Medicaid Other | Source: Ambulatory Visit | Attending: Physical Medicine and Rehabilitation | Admitting: Physical Medicine and Rehabilitation

## 2015-12-24 DIAGNOSIS — M50322 Other cervical disc degeneration at C5-C6 level: Secondary | ICD-10-CM

## 2015-12-24 DIAGNOSIS — M4802 Spinal stenosis, cervical region: Secondary | ICD-10-CM | POA: Insufficient documentation

## 2015-12-24 DIAGNOSIS — M50221 Other cervical disc displacement at C4-C5 level: Secondary | ICD-10-CM | POA: Insufficient documentation

## 2015-12-24 DIAGNOSIS — M50222 Other cervical disc displacement at C5-C6 level: Secondary | ICD-10-CM | POA: Diagnosis not present

## 2015-12-24 DIAGNOSIS — M546 Pain in thoracic spine: Secondary | ICD-10-CM | POA: Diagnosis present

## 2015-12-24 DIAGNOSIS — M50223 Other cervical disc displacement at C6-C7 level: Secondary | ICD-10-CM | POA: Insufficient documentation

## 2015-12-24 DIAGNOSIS — M4690 Unspecified inflammatory spondylopathy, site unspecified: Secondary | ICD-10-CM | POA: Diagnosis not present

## 2015-12-24 DIAGNOSIS — M549 Dorsalgia, unspecified: Secondary | ICD-10-CM

## 2016-01-16 ENCOUNTER — Ambulatory Visit: Payer: Medicaid Other | Admitting: Adult Health

## 2016-06-09 ENCOUNTER — Ambulatory Visit: Payer: Disability Insurance | Admitting: Internal Medicine

## 2016-06-12 ENCOUNTER — Encounter: Payer: Self-pay | Admitting: Internal Medicine

## 2016-06-12 ENCOUNTER — Ambulatory Visit (HOSPITAL_COMMUNITY)
Admission: RE | Admit: 2016-06-12 | Discharge: 2016-06-12 | Disposition: A | Discharge status: Home / Self Care | Payer: Medicaid Other | Source: Ambulatory Visit | Attending: Internal Medicine | Admitting: Internal Medicine

## 2016-06-12 ENCOUNTER — Ambulatory Visit (INDEPENDENT_AMBULATORY_CARE_PROVIDER_SITE_OTHER): Payer: Medicaid Other | Admitting: Internal Medicine

## 2016-06-12 ENCOUNTER — Other Ambulatory Visit: Payer: Self-pay | Admitting: Internal Medicine

## 2016-06-12 ENCOUNTER — Encounter (HOSPITAL_COMMUNITY): Payer: Self-pay

## 2016-06-12 ENCOUNTER — Other Ambulatory Visit: Payer: Self-pay

## 2016-06-12 VITALS — BP 154/80 | HR 75 | Temp 98.0°F | Ht 63.0 in | Wt 156.4 lb

## 2016-06-12 DIAGNOSIS — R109 Unspecified abdominal pain: Secondary | ICD-10-CM

## 2016-06-12 DIAGNOSIS — G8929 Other chronic pain: Secondary | ICD-10-CM

## 2016-06-12 DIAGNOSIS — R1011 Right upper quadrant pain: Principal | ICD-10-CM

## 2016-06-12 MED ORDER — TECHNETIUM TC 99M MEBROFENIN IV KIT
5.0000 | PACK | Freq: Once | INTRAVENOUS | Status: AC | PRN
  Administered 2016-06-12: 5 via INTRAVENOUS

## 2016-06-12 NOTE — Patient Instructions (Addendum)
HIDA with fatty meal challenge - pt with known gallstones - non-specific upper abdominal pain  Continue prilosec daily  Hepatitis B surface antigen, hepatitis C antibody and lipid profile along with fasting blood sugar (health dept request)  Further recommendations to follow

## 2016-06-12 NOTE — Progress Notes (Signed)
Primary Care Physician:  Raiford Simmonds., PA-C Primary Gastroenterologist:  Dr. Gala Romney  Pre-Procedure History & Physical: HPI:  Destiny Young is a 60 y.o. female here for follow-up of upper abdominal pain. History of fundoplication. History of cholelithiasis with somewhat thickened gallbladder wall history of ventral hernia. Was sent to [facility] last year. It was not clear which one of these entities was causing most symptoms. She was referred to [facility]. She did not pursue this. Husband died last year with an MI. He apparently was found to have hepatitis B and C. She wants to be checked. She's not having any dysphagia. Reflux symptoms well controlled with fundoplication.  Prilosec 20 mg daily. She feels a bulge below her xiphoid process at times when she turns an /exert herself. This causes her quite a bit of discomfort. Occasionally has right upper quadrant discomfort as well. Denies any yellow jaundice, fever or chills.  Health dept requests  some labs done here as well-lipid profile and fasting glucose.  EGD last year demonstrated intact fundoplication and non-H. Pylori of gastric erosions.   Past Medical History:  Diagnosis Date  . Anxiety   . Aortic insufficiency   . Arthritis   . Bradycardia   . Burning chest pain   . Ejection fraction   . GERD (gastroesophageal reflux disease)    History of Nissen fundoplication  . Hemorrhoids   . High cholesterol   . History of Nissen fundoplication    [id number]  . IBS (irritable bowel syndrome)   . Osteoporosis   . Pelvic pain in female 12/05/2015  . Pelvic relaxation 12/05/2015  . Rectocele 12/05/2015  . Skin tag 12/05/2015  . TMJ (dislocation of temporomandibular joint)     Past Surgical History:  Procedure Laterality Date  . ABDOMINAL HYSTERECTOMY    . ABDOMINAL SURGERY     hernia repair  . CERVICAL ABLATION    . COLONOSCOPY  May 2011   Dr. Gala Romney: Normal terminal ileum to 10 cm, normal colonoscopy.  .  ESOPHAGOGASTRODUODENOSCOPY  04/14/2006   [id number] esophagus with esophagogastric junction at 36 cm from the incisors.  Intact Nissen fundoplication, otherwise normal  . ESOPHAGOGASTRODUODENOSCOPY  May 2011   Dr. Gala Romney: normal, status post intact Nissen Fundoplication, antral erosions and erythema with chronic gastritis but no H. pylori on biopsies.  . ESOPHAGOGASTRODUODENOSCOPY N/A 12/03/2014   Dr.Massa Pe- intact fundoplication, antral erosions s/p bx. Bx= focally eroded reactive gastopathy negative for hpylori  . EXTERNAL EAR SURGERY    . HERNIA REPAIR    . NISSEN FUNDOPLICATION  [id number]  . NOSE SURGERY    . SHOULDER SURGERY    . TONSILLECTOMY      Prior to Admission medications   Medication Sig Start Date End Date Taking? Authorizing Provider  acetaminophen (TYLENOL) 500 MG tablet Take 500 mg by mouth every 6 (six) hours as needed for pain.   Yes Historical Provider, MD  albuterol (PROVENTIL HFA;VENTOLIN HFA) 108 (90 BASE) MCG/ACT inhaler Inhale 2 puffs into the lungs every 6 (six) hours as needed. For shortness of breath   Yes Historical Provider, MD  aspirin 81 MG tablet Take 81 mg by mouth daily.   Yes Historical Provider, MD  calcium carbonate (TUMS - DOSED IN MG ELEMENTAL CALCIUM) 500 MG chewable tablet Chew 1-2 tablets by mouth daily as needed for indigestion or heartburn.   Yes Historical Provider, MD  Calcium Citrate (CITRACAL PO) Take 1 tablet by mouth 2 (two) times daily.    Yes Historical  Provider, MD  diphenhydrAMINE (BENADRYL) 25 MG tablet Take 25 mg by mouth daily as needed for allergies.   Yes Historical Provider, MD  Magnesium Chloride (MAGNESIUM DR PO) Take 1 tablet by mouth daily.   Yes Historical Provider, MD  Multiple Vitamin (MULTIVITAMIN WITH MINERALS) TABS Take 1 tablet by mouth daily.   Yes Historical Provider, MD  Omega-3 Fatty Acids (FISH OIL) 1000 MG CAPS Take 1 capsule by mouth daily.    Yes Historical Provider, MD  omeprazole (PRILOSEC) 10 MG capsule Take  10 mg by mouth daily.   Yes Historical Provider, MD  simethicone (GAS RELIEF) 80 MG chewable tablet Chew 80-160 mg by mouth daily as needed for flatulence.   Yes Historical Provider, MD  vitamin C (ASCORBIC ACID) 500 MG tablet Take 500 mg by mouth daily.   Yes Historical Provider, MD  doxylamine, Sleep, (SLEEP AID) 25 MG tablet Take 25 mg by mouth at bedtime as needed for sleep.    Historical Provider, MD  escitalopram (LEXAPRO) 10 MG tablet Take 1 tablet (10 mg total) by mouth daily. Patient not taking: Reported on 06/12/2016 12/05/15   Estill Dooms, NP  ibuprofen (ADVIL,MOTRIN) 200 MG tablet Take 200 mg by mouth every 6 (six) hours as needed for mild pain or moderate pain.    Historical Provider, MD  Probiotic Product (PROBIOTIC PO) Take by mouth daily.    Historical Provider, MD    Allergies as of 06/12/2016 - Review Complete 06/12/2016  Allergen Reaction Noted  . Codeine Nausea And Vomiting   . Meperidine hcl Nausea And Vomiting   . Metaxalone Other (See Comments)   . Naproxen Other (See Comments) 07/29/2012  . Neurontin [gabapentin] Other (See Comments) 12/05/2015  . Other  11/18/2011  . Simvastatin Other (See Comments) 08/21/2013  . Sulfonamide derivatives Other (See Comments)   . Tizanidine  07/05/2013  . Ciprofloxacin Rash     Family History  Problem Relation Age of Onset  . Lung cancer Father   . Irritable bowel syndrome Sister   . Irritable bowel syndrome Sister   . Hyperlipidemia Sister   . Hypertension Sister   . Vascular Disease Sister   . Hyperlipidemia Sister   . Hypertension Sister   . Hyperlipidemia Mother   . GER disease Mother   . Irritable bowel syndrome Mother   . Hypertension Brother   . Hyperlipidemia Brother   . Hypertension Son   . Hyperlipidemia Son   . Stroke Maternal Grandmother   . Diabetes Maternal Grandmother   . Hypertension Maternal Grandmother   . Hyperlipidemia Maternal Grandmother   . Hypertension Maternal Grandfather   . Heart  attack Maternal Grandfather   . Diabetes Paternal Grandmother   . Hyperlipidemia Paternal Grandmother   . Hypertension Paternal Grandmother   . Kidney disease Paternal Grandmother   . Hypertension Paternal Grandfather   . Hyperlipidemia Paternal Grandfather   . Heart attack Paternal Grandfather   . Diabetes Sister   . Hypertension Son     Social History   Social History  . Marital status: Married    Spouse name: N/A  . Number of children: 3  . Years of education: N/A   Occupational History  . cleans houses    Social History Main Topics  . Smoking status: Never Smoker  . Smokeless tobacco: Never Used  . Alcohol use Yes     Comment: occ glass of red wine  . Drug use:     Types: Marijuana  Comment: occ  . Sexual activity: Not Currently    Birth control/ protection: Surgical     Comment: hyst   Other Topics Concern  . Not on file   Social History Narrative  . No narrative on file    Review of Systems: See HPI, otherwise negative ROS  Physical Exam: BP (!) 154/80   Pulse 75   Temp 98 F (36.7 C) (Oral)   Ht 5\' 3"  (1.6 m)   Wt 156 lb 6.4 oz (70.9 kg)   BMI 27.71 kg/m  General:   Alert,  Well-developed, well-nourished, pleasant and cooperative in NAD Skin:  Intact without significant lesions or rashes. Eyes:  Sclera clear, no icterus.   Conjunctiva pink. Ears:  Normal auditory acuity. Nose:  No deformity, discharge,  or lesions. Mouth:  No deformity or lesions. Neck:  Supple; no masses or thyromegaly. No significant cervical adenopathy. Lungs:  Clear throughout to auscultation.   No wheezes, crackles, or rhonchi. No acute distress. Heart:  Regular rate and rhythm; no murmurs, clicks, rubs,  or gallops. Abdomen: Non-distended, normal bowel sounds.  Soft and nontender without appreciable mass or hepatosplenomegaly.  Pulses:  Normal pulses noted. Extremities:  Without clubbing or edema.  Impression:  60 year old [place] with nonspecific abdominal pain the  setting of a ventral hernia, fundoplication and known cholelithiasis.  After extensive discussion, get the sense that her fundoplication is stable and not causing her significant symptoms; GERD symptoms are well controlled. No dysphagia. Positional component abdominal wall pain implies symptomatic ventral hernia.. She has known cholelithiasis. I wonder if she has significant biliary dyskinesia and /or an element of chronic cholecystitis at this time.  Husband reported chronic hepatitis B and C. She wants to be screened for those in the knees. Health Department also like her to have some labs at this time (fasting glucose and lipid profile)   Recommendations:   HIDA with fatty meal challenge - pt with known gallstones - non-specific upper abdominal pain  Continue prilosec daily  Hepatitis B surface antigen, hepatitis C antibody and lipid profile along with fasting blood sugar (health dept request)  Further recommendations to follow      This dictation was prepared with Dragon dictation along with smaller phrase technology. Any transcriptional errors that result from this process are unintentional and may not be corrected upon review.

## 2016-06-13 LAB — HEPATITIS C ANTIBODY: HCV AB: NEGATIVE

## 2016-06-13 LAB — HEPATITIS B SURFACE ANTIGEN: Hepatitis B Surface Ag: NEGATIVE

## 2016-06-16 ENCOUNTER — Other Ambulatory Visit: Payer: Self-pay

## 2016-06-16 DIAGNOSIS — K439 Ventral hernia without obstruction or gangrene: Secondary | ICD-10-CM

## 2016-07-21 ENCOUNTER — Encounter: Payer: Self-pay | Admitting: Gastroenterology

## 2016-07-21 ENCOUNTER — Ambulatory Visit (INDEPENDENT_AMBULATORY_CARE_PROVIDER_SITE_OTHER): Payer: Medicaid Other | Admitting: Gastroenterology

## 2016-07-21 VITALS — BP 152/79 | HR 86 | Temp 97.9°F | Ht 63.0 in | Wt 156.6 lb

## 2016-07-21 DIAGNOSIS — K5900 Constipation, unspecified: Secondary | ICD-10-CM

## 2016-07-21 DIAGNOSIS — K439 Ventral hernia without obstruction or gangrene: Secondary | ICD-10-CM | POA: Diagnosis not present

## 2016-07-21 DIAGNOSIS — K219 Gastro-esophageal reflux disease without esophagitis: Secondary | ICD-10-CM | POA: Diagnosis not present

## 2016-07-21 MED ORDER — LUBIPROSTONE 8 MCG PO CAPS: 8.0000 ug | ORAL_CAPSULE | Freq: Two times a day (BID) | ORAL | 3 refills | Status: DC

## 2016-07-21 NOTE — Progress Notes (Signed)
Primary Care Physician: Raiford Simmonds., PA-C  Primary Gastroenterologist:  Garfield Cornea, MD   Chief Complaint  Patient presents with  . Follow-up    HPI: Destiny Young is a 60 y.o. female here for follow-up of bloating, lump in throat. She has a history of upper abdominal pain, previous fundoplication, recurrent ventral hernia. History of cholelithiasis with somewhat thickened gallbladder wall on ultrasound last year. She never was able to get her gallbladder out. It is felt that the majority of her symptoms related to ventral hernia, she has appointment later this week at [facility] to be seen by surgery. Recently underwent HIDA scan. Gallbladder EF 59%. Patient denies any symptoms associated with CCK initially. She did have some lower abdominal pressure/bloating afterwards. In actually for the following 2 weeks she states that she had severe reflux (typically does not have any significant heartburn symptoms since her fundoplication years ago). Symptoms were quite severe. She now is left with sensation of lump in her throat. She is on Prilosec 20 mg daily chronically. She is also noted increased bloating but this too is resolving. She has had worsening constipation since HIDA scan. Utilizing "Mylanta" to have a bowel movement. Denies blood in the stool or melena.  Current Outpatient Prescriptions  Medication Sig Dispense Refill  . acetaminophen (TYLENOL) 500 MG tablet Take 500 mg by mouth every 6 (six) hours as needed for pain.    Marland Kitchen albuterol (PROVENTIL HFA;VENTOLIN HFA) 108 (90 BASE) MCG/ACT inhaler Inhale 2 puffs into the lungs every 6 (six) hours as needed. For shortness of breath    . aspirin 81 MG tablet Take 81 mg by mouth daily.    . calcium carbonate (TUMS - DOSED IN MG ELEMENTAL CALCIUM) 500 MG chewable tablet Chew 1-2 tablets by mouth daily as needed for indigestion or heartburn.    . Calcium Citrate (CITRACAL PO) Take 1 tablet by mouth 2 (two) times daily.     .  diphenhydrAMINE (BENADRYL) 25 MG tablet Take 25 mg by mouth daily as needed for allergies.    Marland Kitchen doxylamine, Sleep, (SLEEP AID) 25 MG tablet Take 25 mg by mouth at bedtime as needed for sleep.    Marland Kitchen ibuprofen (ADVIL,MOTRIN) 200 MG tablet Take 200 mg by mouth every 6 (six) hours as needed for mild pain or moderate pain.    . Magnesium Chloride (MAGNESIUM DR PO) Take 1 tablet by mouth daily.    . Multiple Vitamin (MULTIVITAMIN WITH MINERALS) TABS Take 1 tablet by mouth daily.    . Omega-3 Fatty Acids (FISH OIL) 1000 MG CAPS Take 1 capsule by mouth daily.     .       . Probiotic Product (PROBIOTIC PO) Take by mouth daily.    . simethicone (GAS RELIEF) 80 MG chewable tablet Chew 80-160 mg by mouth daily as needed for flatulence.    . vitamin C (ASCORBIC ACID) 500 MG tablet Take 500 mg by mouth daily.     No current facility-administered medications for this visit.     Allergies as of 07/21/2016 - Review Complete 07/21/2016  Allergen Reaction Noted  . Codeine Nausea And Vomiting   . Meperidine hcl Nausea And Vomiting   . Metaxalone Other (See Comments)   . Naproxen Other (See Comments) 07/29/2012  . Neurontin [gabapentin] Other (See Comments) 12/05/2015  . Other  11/18/2011  . Simvastatin Other (See Comments) 08/21/2013  . Sulfonamide derivatives Other (See Comments)   . Tizanidine  07/05/2013  . Ciprofloxacin  Rash     ROS:  General: Negative for anorexia, weight loss, fever, chills, fatigue, weakness. ENT: Negative for hoarseness, difficulty swallowing , nasal congestion. CV: Negative for chest pain, angina, palpitations, dyspnea on exertion, peripheral edema.  Respiratory: Negative for dyspnea at rest, dyspnea on exertion, cough, sputum, wheezing.  GI: See history of present illness. GU:  Negative for dysuria, hematuria, urinary incontinence, urinary frequency, nocturnal urination.  Endo: Negative for unusual weight change.    Physical Examination:   BP (!) 152/79   Pulse 86    Temp 97.9 F (36.6 C) (Oral)   Ht 5\' 3"  (1.6 m)   Wt 156 lb 9.6 oz (71 kg)   BMI 27.74 kg/m   General: Well-nourished, well-developed in no acute distress.  Eyes: No icterus. Mouth: Oropharyngeal mucosa moist and pink , no lesions erythema or exudate. Lungs: Clear to auscultation bilaterally.  Heart: Regular rate and rhythm, no murmurs rubs or gallops.  Abdomen: Bowel sounds are normal,nondistended, no hepatosplenomegaly or masses, no abdominal bruits,no rebound or guarding.  Medium sized ventral hernia easily reducible nontender. Mild to moderate tenderness below hernia and around the umbilicus. Extremities: No lower extremity edema. No clubbing or deformities. Neuro: Alert and oriented x 4   Skin: Warm and dry, no jaundice.   Psych: Alert and cooperative, normal mood and affect.

## 2016-07-21 NOTE — Progress Notes (Signed)
CC'ED TO PCP 

## 2016-07-21 NOTE — Assessment & Plan Note (Signed)
Flare of constipation since HIDA scan. Likely coincidental. Add Amitiza 8 g twice a day, samples and prescription provided. Call with further concerns.

## 2016-07-21 NOTE — Assessment & Plan Note (Signed)
Keep upcoming appointment this week with Gen. surgery at [facility].

## 2016-07-21 NOTE — Patient Instructions (Signed)
1. Increase omeprazole to 20 mg twice daily before breakfast and before your evening meal. Samples provided. Please do this over the next 2 weeks. Call me if you have ongoing sensation of lump in her throat. 2. Amitiza 8 g twice daily with food for constipation. Samples provided. Prescription sent to your pharmacy as well. 3. Keep upcoming appointment with surgery at [facility].

## 2016-07-21 NOTE — Assessment & Plan Note (Signed)
Remote Nissen fundoplication. Patient chronically on Prilosec 20 mg daily. Recent severe heartburn post HIDA scan. Typical heartburn resolved but complains of globus. Suspect will continue to improve but increase Prilosec twice a day for the next 2 weeks. Samples provided to supplement her prescription. Call with further concerns.

## 2016-08-04 ENCOUNTER — Other Ambulatory Visit (HOSPITAL_COMMUNITY): Payer: Self-pay | Admitting: Surgery

## 2016-08-04 DIAGNOSIS — K3184 Gastroparesis: Secondary | ICD-10-CM

## 2016-08-13 ENCOUNTER — Encounter (HOSPITAL_COMMUNITY)
Admission: RE | Admit: 2016-08-13 | Discharge: 2016-08-13 | Disposition: A | Discharge status: Home / Self Care | Payer: Medicaid Other | Source: Ambulatory Visit | Attending: Surgery | Admitting: Surgery

## 2016-08-13 ENCOUNTER — Encounter (HOSPITAL_COMMUNITY): Payer: Self-pay

## 2016-08-13 DIAGNOSIS — K3184 Gastroparesis: Secondary | ICD-10-CM | POA: Insufficient documentation

## 2016-08-13 MED ORDER — TECHNETIUM TC 99M SULFUR COLLOID
2.0000 | Freq: Once | INTRAVENOUS | Status: AC | PRN
  Administered 2016-08-13: 2.1 via ORAL

## 2016-11-02 ENCOUNTER — Ambulatory Visit (INDEPENDENT_AMBULATORY_CARE_PROVIDER_SITE_OTHER): Payer: Medicaid Other | Admitting: Family Medicine

## 2016-11-02 ENCOUNTER — Encounter: Payer: Self-pay | Admitting: Family Medicine

## 2016-11-02 VITALS — BP 130/70 | HR 88 | Temp 98.8°F | Resp 18 | Ht 63.0 in | Wt 152.0 lb

## 2016-11-02 DIAGNOSIS — F450 Somatization disorder: Secondary | ICD-10-CM

## 2016-11-02 DIAGNOSIS — Z8249 Family history of ischemic heart disease and other diseases of the circulatory system: Secondary | ICD-10-CM

## 2016-11-02 DIAGNOSIS — G8929 Other chronic pain: Secondary | ICD-10-CM

## 2016-11-02 DIAGNOSIS — Z131 Encounter for screening for diabetes mellitus: Secondary | ICD-10-CM | POA: Diagnosis not present

## 2016-11-02 DIAGNOSIS — Z7689 Persons encountering health services in other specified circumstances: Secondary | ICD-10-CM | POA: Diagnosis not present

## 2016-11-02 DIAGNOSIS — F411 Generalized anxiety disorder: Secondary | ICD-10-CM | POA: Diagnosis not present

## 2016-11-02 DIAGNOSIS — F419 Anxiety disorder, unspecified: Secondary | ICD-10-CM

## 2016-11-02 DIAGNOSIS — R1013 Epigastric pain: Secondary | ICD-10-CM

## 2016-11-02 LAB — CBC WITH DIFFERENTIAL/PLATELET
BASOS ABS: 0 {cells}/uL (ref 0–200)
Basophils Relative: 0 %
EOS PCT: 1 %
Eosinophils Absolute: 55 cells/uL (ref 15–500)
HCT: 39.4 % (ref 35.0–45.0)
Hemoglobin: 13 g/dL (ref 11.7–15.5)
Lymphocytes Relative: 44 %
Lymphs Abs: 2420 cells/uL (ref 850–3900)
MCH: 29.8 pg (ref 27.0–33.0)
MCHC: 33 g/dL (ref 32.0–36.0)
MCV: 90.4 fL (ref 80.0–100.0)
MONOS PCT: 6 %
MPV: 8.9 fL (ref 7.5–12.5)
Monocytes Absolute: 330 cells/uL (ref 200–950)
NEUTROS ABS: 2695 {cells}/uL (ref 1500–7800)
Neutrophils Relative %: 49 %
PLATELETS: 338 10*3/uL (ref 140–400)
RBC: 4.36 MIL/uL (ref 3.80–5.10)
RDW: 13 % (ref 11.0–15.0)
WBC: 5.5 10*3/uL (ref 3.8–10.8)

## 2016-11-02 MED ORDER — ALBUTEROL SULFATE HFA 108 (90 BASE) MCG/ACT IN AERS: 2.0000 | INHALATION_SPRAY | Freq: Four times a day (QID) | RESPIRATORY_TRACT | 0 refills | Status: DC | PRN

## 2016-11-02 MED ORDER — DULOXETINE HCL 20 MG PO CPEP: 20.0000 mg | ORAL_CAPSULE | Freq: Every day | ORAL | 1 refills | Status: DC

## 2016-11-02 NOTE — Patient Instructions (Signed)
Need old records  Need labs today  Start duloxetine This is for anxiety Take daily Call for problems  Follow up in 1 month ( 40 min)

## 2016-11-02 NOTE — Progress Notes (Addendum)
Chief Complaint  Patient presents with  . Establish Care   Here to establish Under care GI medicine Under care health department Up to date with mammogram and colon cancer screening Thinks she is up to date with tetanus, uncertain prevnar, refuses flu shot.  Discussed.  Still refuses. No lab tests in over a year.  History high lipids.  Is on fish oil.    Biggest problem is chronic abdominal pain.  She is talkative.  Very worried about every symptom and what it means.  Talks about severe abdominal pain with eating, moving, deep breath.  Worried about having a heart attack.  Much more concerned about non life threatening conditions than see needs to be.  We discussed her chronic anxiety and my fear she is somatizing.  Her anxiety is "making her sick".  She is willing to try a SSRI - but she is intolerant to many medicines - see list - with NO true allergies.  I am uncertain if she will tolerate - but will try.  Psychology referral if not successful.  Admits to taking her mothers clonopin.  Seems sedated today - impaired - but she insists it is lack of sleep.    Patient Active Problem List   Diagnosis Date Noted  . Anxiety with somatization 11/02/2016  . Ventral hernia 07/21/2016  . Pelvic relaxation 12/05/2015  . Skin tag 12/05/2015  . Pelvic pain in female 12/05/2015  . Mucosal abnormality of stomach   . Constipation 10/11/2014  . Choledocholithiasis 10/11/2014    Class: Question of  . Cholelithiases 10/11/2014  . Dyslipidemia 08/21/2013  . IBS (irritable bowel syndrome)   . Anxiety   . History of Nissen fundoplication   . Bradycardia   . Burning chest pain   . RUQ pain 07/05/2013  . Early satiety 01/20/2013  . GERD 02/04/2010    Outpatient Encounter Prescriptions as of 11/02/2016  Medication Sig  . acetaminophen (TYLENOL) 500 MG tablet Take 500 mg by mouth every 6 (six) hours as needed for pain.  Marland Kitchen albuterol (PROVENTIL HFA;VENTOLIN HFA) 108 (90 Base) MCG/ACT inhaler Inhale  2 puffs into the lungs every 6 (six) hours as needed. For shortness of breath  . aspirin 81 MG tablet Take 81 mg by mouth daily.  . calcium carbonate (TUMS - DOSED IN MG ELEMENTAL CALCIUM) 500 MG chewable tablet Chew 1-2 tablets by mouth daily as needed for indigestion or heartburn.  . Calcium Citrate (CITRACAL PO) Take 1 tablet by mouth 2 (two) times daily.   . diphenhydrAMINE (BENADRYL) 25 MG tablet Take 25 mg by mouth daily as needed for allergies.  . DULoxetine (CYMBALTA) 20 MG capsule Take 1 capsule (20 mg total) by mouth daily.  . Magnesium Chloride (MAGNESIUM DR PO) Take 1 tablet by mouth daily.  . Multiple Vitamin (MULTIVITAMIN WITH MINERALS) TABS Take 1 tablet by mouth daily.  . Omega-3 Fatty Acids (FISH OIL) 1000 MG CAPS Take 1 capsule by mouth daily.   . Probiotic Product (PROBIOTIC PO) Take by mouth daily.   No facility-administered encounter medications on file as of 11/02/2016.     Past Medical History:  Diagnosis Date  . Allergy   . Anxiety   . Aortic insufficiency   . Arthritis    bulging discs  . Asthma   . Bradycardia   . Burning chest pain   . Cancer (HCC)    abnormal PAP  . Depression   . Ejection fraction   . GERD (gastroesophageal reflux disease)  History of Nissen fundoplication  . Hemorrhoids   . High cholesterol   . History of Nissen fundoplication    [id number]  . IBS (irritable bowel syndrome)   . Pelvic pain in female 12/05/2015  . Pelvic relaxation 12/05/2015  . Rectocele 12/05/2015  . Skin tag 12/05/2015  . TMJ (dislocation of temporomandibular joint)   . Ventricular tachycardia ([place]) 08/21/2013   7 beats ventricular tachycardia, rate 180, 8 day event recorder, July 13, 2013  //   nuclear stress study normal, November, 2014, normal EF by 2-D and nuclear     Past Surgical History:  Procedure Laterality Date  . ABDOMINAL HYSTERECTOMY    . ABDOMINAL SURGERY     hernia repair  . CERVICAL ABLATION    . COLONOSCOPY  May 2011   Dr. Gala Romney: Normal  terminal ileum to 10 cm, normal colonoscopy.  . ESOPHAGOGASTRODUODENOSCOPY  04/14/2006   [id number] esophagus with esophagogastric junction at 36 cm from the incisors.  Intact Nissen fundoplication, otherwise normal  . ESOPHAGOGASTRODUODENOSCOPY  May 2011   Dr. Gala Romney: normal, status post intact Nissen Fundoplication, antral erosions and erythema with chronic gastritis but no H. pylori on biopsies.  . ESOPHAGOGASTRODUODENOSCOPY N/A 12/03/2014   Dr.Rourk- intact fundoplication, antral erosions s/p bx. Bx= focally eroded reactive gastopathy negative for hpylori  . EXTERNAL EAR SURGERY    . HERNIA REPAIR    . NISSEN FUNDOPLICATION  [id number]  . NOSE SURGERY    . SHOULDER SURGERY    . TONSILLECTOMY      Social History   Social History  . Marital status: Widowed    Spouse name: Annie Main  . Number of children: 3  . Years of education: 9   Occupational History  . cleans houses    Social History Main Topics  . Smoking status: Never Smoker  . Smokeless tobacco: Never Used  . Alcohol use Yes     Comment: occ glass of red wine  . Drug use:     Types: Marijuana     Comment: occ  . Sexual activity: Not Currently    Birth control/ protection: Surgical     Comment: hyst   Other Topics Concern  . Not on file   Social History Narrative   Lives independently   Daughter and granddaughter live with her      Disabled- for stomach problems and orthopedic problems, nerves    Family History  Problem Relation Age of Onset  . Lung cancer Father   . Cancer Father     lung cancer  . Irritable bowel syndrome Sister   . Irritable bowel syndrome Sister   . Hyperlipidemia Sister   . Hypertension Sister   . Vascular Disease Sister   . Hyperlipidemia Sister   . Hypertension Sister   . Hyperlipidemia Mother   . GER disease Mother   . Irritable bowel syndrome Mother   . Hypertension Brother   . Hyperlipidemia Brother   . Hypertension Son   . Hyperlipidemia Son   . Stroke Maternal  Grandmother   . Diabetes Maternal Grandmother   . Hypertension Maternal Grandmother   . Hyperlipidemia Maternal Grandmother   . Hypertension Maternal Grandfather   . Heart attack Maternal Grandfather   . Diabetes Paternal Grandmother   . Hyperlipidemia Paternal Grandmother   . Hypertension Paternal Grandmother   . Kidney disease Paternal Grandmother   . Hypertension Paternal Grandfather   . Hyperlipidemia Paternal Grandfather   . Heart attack Paternal Grandfather   . Diabetes Sister   .  Hypertension Son   . Cancer Paternal Aunt     ovarian    Review of Systems  Constitutional: Positive for malaise/fatigue. Negative for weight loss.  HENT: Negative for congestion and hearing loss.   Eyes: Negative for blurred vision and double vision.  Respiratory: Negative for shortness of breath and wheezing.   Cardiovascular: Positive for chest pain. Negative for leg swelling.  Gastrointestinal: Positive for abdominal pain, heartburn and nausea.  Genitourinary: Negative for dysuria.  Musculoskeletal: Positive for back pain, myalgias and neck pain.  Neurological: Positive for dizziness and sensory change. Negative for headaches.  Psychiatric/Behavioral: Positive for depression. Negative for substance abuse. The patient is nervous/anxious and has insomnia.     BP 130/70 (BP Location: Right Arm, Patient Position: Sitting, Cuff Size: Normal)   Pulse 88   Temp 98.8 F (37.1 C) (Oral)   Resp 18   Ht 5\' 3"  (1.6 m)   Wt 152 lb (68.9 kg)   SpO2 99%   BMI 26.93 kg/m   Physical Exam  Constitutional: She is oriented to person, place, and time. She appears well-developed and well-nourished.  Talkative, anxious  HENT:  Head: Normocephalic and atraumatic.  Right Ear: External ear normal.  Left Ear: External ear normal.  Mouth/Throat: Oropharynx is clear and moist.  Eyes: Conjunctivae are normal. Pupils are equal, round, and reactive to light.  Neck: Normal range of motion. Neck supple. No  thyromegaly present.  Cardiovascular: Normal rate, regular rhythm and normal heart sounds.   Pulmonary/Chest: Effort normal and breath sounds normal. No respiratory distress.  Abdominal: Soft. Bowel sounds are normal. She exhibits no distension. There is tenderness.  Lymphadenopathy:    She has no cervical adenopathy.  Neurological: She is alert and oriented to person, place, and time.  Gait normal  Skin: Skin is warm and dry.  Psychiatric: Her behavior is normal. Her mood appears anxious. Her affect is labile. Her speech is rapid and/or pressured and slurred. Cognition and memory are normal.  Talkative and difficult to direct.  Worried to the point of sounding neurotic about symptoms. Sounds slightly slurred/sedated.  Nursing note and vitals reviewed. ASSESSMENT/PLAN:   1. Encounter to establish care with new doctor - Vitamin B12 - COMPLETE METABOLIC PANEL WITH GFR - Hemoglobin A1c - CBC with Differential/Platelet - Lipid panel - TSH - Urinalysis, Routine w reflex microscopic - VITAMIN D 25 Hydroxy (Vit-D Deficiency, Fractures)  2. Family history of heart disease  3. Screening for diabetes mellitus  4. GAD (generalized anxiety disorder) Self medicating marijuana and clonopin 5. Abdominal pain, chronic, epigastric Discontinued own prilosec  6. Anxiety with somatization Duloxetine 20 a day every day   Patient Instructions  Need old records  Need labs today  Start duloxetine This is for anxiety Take daily Call for problems  Follow up in 1 month ( 40 min)   Raylene Everts, MD

## 2016-11-03 LAB — TSH: TSH: 0.45 mIU/L

## 2016-11-03 LAB — COMPLETE METABOLIC PANEL WITH GFR
ALT: 12 U/L (ref 6–29)
AST: 18 U/L (ref 10–35)
Albumin: 4.2 g/dL (ref 3.6–5.1)
Alkaline Phosphatase: 64 U/L (ref 33–130)
BUN: 9 mg/dL (ref 7–25)
CALCIUM: 9.6 mg/dL (ref 8.6–10.4)
CHLORIDE: 105 mmol/L (ref 98–110)
CO2: 27 mmol/L (ref 20–31)
CREATININE: 0.87 mg/dL (ref 0.50–1.05)
GFR, EST AFRICAN AMERICAN: 84 mL/min (ref >=60)
GFR, EST NON AFRICAN AMERICAN: 73 mL/min (ref >=60)
Glucose, Bld: 93 mg/dL (ref 65–99)
POTASSIUM: 4.3 mmol/L (ref 3.5–5.3)
Sodium: 141 mmol/L (ref 135–146)
Total Bilirubin: 0.4 mg/dL (ref 0.2–1.2)
Total Protein: 7 g/dL (ref 6.1–8.1)

## 2016-11-03 LAB — URINALYSIS, ROUTINE W REFLEX MICROSCOPIC
Bilirubin Urine: NEGATIVE
Glucose, UA: NEGATIVE
Hgb urine dipstick: NEGATIVE
Ketones, ur: NEGATIVE
LEUKOCYTES UA: NEGATIVE
NITRITE: NEGATIVE
Protein, ur: NEGATIVE
SPECIFIC GRAVITY, URINE: 1.017 (ref 1.001–1.035)
pH: 7 (ref 5.0–8.0)

## 2016-11-03 LAB — LIPID PANEL
Cholesterol: 234 mg/dL — ABNORMAL HIGH (ref <200)
HDL: 41 mg/dL — ABNORMAL LOW (ref >50)
LDL Cholesterol: 161 mg/dL — ABNORMAL HIGH (ref <100)
Total CHOL/HDL Ratio: 5.7 Ratio — ABNORMAL HIGH (ref <5.0)
Triglycerides: 158 mg/dL — ABNORMAL HIGH (ref <150)
VLDL: 32 mg/dL — ABNORMAL HIGH (ref <30)

## 2016-11-03 LAB — VITAMIN D 25 HYDROXY (VIT D DEFICIENCY, FRACTURES): VIT D 25 HYDROXY: 30 ng/mL (ref 30–100)

## 2016-11-03 LAB — HEMOGLOBIN A1C
HEMOGLOBIN A1C: 5.5 % (ref <5.7)
MEAN PLASMA GLUCOSE: 111 mg/dL

## 2016-11-03 LAB — VITAMIN B12: Vitamin B-12: 400 pg/mL (ref 200–1100)

## 2016-11-13 ENCOUNTER — Telehealth: Payer: Self-pay

## 2016-11-13 NOTE — Telephone Encounter (Signed)
Released to her in My Chart with a message her cholesterol is high and we will discuss when she comes in.

## 2016-11-16 ENCOUNTER — Telehealth: Payer: Self-pay

## 2016-11-16 NOTE — Telephone Encounter (Signed)
Pt states its been going on over a year and would like it looked at prior to coming back in.

## 2016-11-16 NOTE — Telephone Encounter (Signed)
I ma sorry, but I don't recall any podiatry problems.  Can it wait until her followup?

## 2016-11-16 NOTE — Telephone Encounter (Signed)
Thank you for calling her back.  I will address appropriately.

## 2016-11-24 ENCOUNTER — Other Ambulatory Visit: Payer: Self-pay | Admitting: Family Medicine

## 2016-11-24 DIAGNOSIS — Z1231 Encounter for screening mammogram for malignant neoplasm of breast: Secondary | ICD-10-CM

## 2016-11-30 ENCOUNTER — Encounter: Payer: Self-pay | Admitting: Family Medicine

## 2016-11-30 ENCOUNTER — Ambulatory Visit (INDEPENDENT_AMBULATORY_CARE_PROVIDER_SITE_OTHER): Payer: Medicaid Other | Admitting: Family Medicine

## 2016-11-30 VITALS — BP 136/70 | HR 66 | Temp 98.4°F | Resp 16 | Ht 63.0 in | Wt 156.0 lb

## 2016-11-30 DIAGNOSIS — E785 Hyperlipidemia, unspecified: Secondary | ICD-10-CM

## 2016-11-30 DIAGNOSIS — B351 Tinea unguium: Secondary | ICD-10-CM

## 2016-11-30 DIAGNOSIS — F419 Anxiety disorder, unspecified: Secondary | ICD-10-CM

## 2016-11-30 DIAGNOSIS — F450 Somatization disorder: Secondary | ICD-10-CM

## 2016-11-30 MED ORDER — DULOXETINE HCL 20 MG PO CPEP: 20.0000 mg | ORAL_CAPSULE | Freq: Two times a day (BID) | ORAL | 3 refills | Status: DC

## 2016-11-30 MED ORDER — ATORVASTATIN CALCIUM 10 MG PO TABS: 10.0000 mg | ORAL_TABLET | Freq: Every day | ORAL | 3 refills | Status: DC

## 2016-11-30 NOTE — Patient Instructions (Addendum)
Tale the atorvastatin ( lipitor) every other day Take at night If this does not cause any aches, take daily Need blood test in 3 months See me in 3 months  Double the duloxetine Take 40 a day  I have referred to podiatry for the toenail  Follow up with psychologist down the hall

## 2016-11-30 NOTE — Progress Notes (Addendum)
Chief Complaint  Patient presents with  . Follow-up    1 month   No improvement on the cymbalta Is taking 20 mg a day No noted side effects, either I will double Reminded to try to take a walk every day  Her labs are reviewed with her Everything is normal or as expected- except the lipids.  Her Framingham  risk score is calculated at 11 %.  She tried simvastatin but felt body aches.  Is willing to try a different product.  Her mom has good luck with lipitor so she will take this.  Has a toenail deformed by fungus.  Wants to see podiatry  Patient Active Problem List   Diagnosis Date Noted  . Anxiety with somatization 11/02/2016  . Ventral hernia 07/21/2016  . Pelvic relaxation 12/05/2015  . Skin tag 12/05/2015  . Pelvic pain in female 12/05/2015  . Mucosal abnormality of stomach   . Constipation 10/11/2014  . Choledocholithiasis 10/11/2014    Class: Question of  . Cholelithiases 10/11/2014  . Dyslipidemia 08/21/2013  . IBS (irritable bowel syndrome)   . Anxiety   . History of Nissen fundoplication   . Bradycardia   . Burning chest pain   . RUQ pain 07/05/2013  . Early satiety 01/20/2013  . GERD 02/04/2010    Outpatient Encounter Prescriptions as of 11/30/2016  Medication Sig  . acetaminophen (TYLENOL) 500 MG tablet Take 500 mg by mouth every 6 (six) hours as needed for pain.  Marland Kitchen albuterol (PROVENTIL HFA;VENTOLIN HFA) 108 (90 Base) MCG/ACT inhaler Inhale 2 puffs into the lungs every 6 (six) hours as needed. For shortness of breath  . aspirin 81 MG tablet Take 81 mg by mouth daily.  . calcium carbonate (TUMS - DOSED IN MG ELEMENTAL CALCIUM) 500 MG chewable tablet Chew 1-2 tablets by mouth daily as needed for indigestion or heartburn.  . Calcium Citrate (CITRACAL PO) Take 1 tablet by mouth 2 (two) times daily.   . diphenhydrAMINE (BENADRYL) 25 MG tablet Take 25 mg by mouth daily as needed for allergies.  . DULoxetine (CYMBALTA) 20 MG capsule Take 1 capsule (20 mg total)  by mouth 2 (two) times daily.  . Magnesium Chloride (MAGNESIUM DR PO) Take 1 tablet by mouth daily.  . Multiple Vitamin (MULTIVITAMIN WITH MINERALS) TABS Take 1 tablet by mouth daily.  . Omega-3 Fatty Acids (FISH OIL) 1000 MG CAPS Take 1 capsule by mouth daily.   . Probiotic Product (PROBIOTIC PO) Take by mouth daily.  Marland Kitchen atorvastatin (LIPITOR) 10 MG tablet Take 1 tablet (10 mg total) by mouth daily.   No facility-administered encounter medications on file as of 11/30/2016.     Allergies  Allergen Reactions  . Ciprofloxacin Other (See Comments)    "Skin burning"- feeling, not allergy  . Codeine Nausea Only    Nausea and sweats  . Meperidine Hcl Nausea And Vomiting    GI intolerance  . Metaxalone Nausea Only    Stomach Irritation   . Naproxen Nausea Only    Stomach Irritation   . Neurontin [Gabapentin] Other (See Comments)    Blurred vision, funny feeling in head- side effect not allergy  . Simvastatin Other (See Comments)    Muscle aches in legs, fatigue- not allergic  . Sulfa Antibiotics Itching    Skin Irritation (Burning, Stinging)   . Tizanidine Other (See Comments)    Abdominal/chest pain/ "too relaxed"    Review of Systems  Constitutional: Negative for activity change, appetite change, fatigue and  fever.  HENT: Negative for congestion and dental problem.   Eyes: Negative for photophobia and visual disturbance.  Respiratory: Negative for cough.   Cardiovascular: Negative for chest pain and palpitations.  Gastrointestinal: Positive for abdominal distention and abdominal pain.  Genitourinary: Negative for difficulty urinating and frequency.  Musculoskeletal: Negative for arthralgias and back pain.  Skin: Positive for rash.  Neurological: Negative for dizziness and headaches.  Psychiatric/Behavioral: Positive for dysphoric mood. The patient is nervous/anxious.       BP 136/70 (BP Location: Right Arm, Patient Position: Sitting, Cuff Size: Normal)   Pulse 66   Temp 98.4  F (36.9 C) (Oral)   Resp 16   Ht 5\' 3"  (1.6 m)   Wt 156 lb 0.6 oz (70.8 kg)   BMI 27.64 kg/m   Physical Exam  Constitutional: She is oriented to person, place, and time. She appears well-developed and well-nourished. No distress.  HENT:  Head: Normocephalic and atraumatic.  Mouth/Throat: Oropharynx is clear and moist.  Neck: Normal range of motion.  Cardiovascular: Normal rate, regular rhythm and normal heart sounds.   Pulmonary/Chest: Effort normal and breath sounds normal. No respiratory distress.  Musculoskeletal: Normal range of motion. She exhibits no edema.  Lymphadenopathy:    She has no cervical adenopathy.  Neurological: She is alert and oriented to person, place, and time.  Skin: Skin is warm and dry.  Great toenail L with dystrophic thickened appearance  Psychiatric: Thought content normal.  Talkative, anxious    ASSESSMENT/PLAN:  1. Onychomycosis of toenail  - Ambulatory referral to Podiatry  2. Dyslipidemia  - Lipid Profile  3. Anxiety with somatization    Patient Instructions  Tale the atorvastatin ( lipitor) every other day Take at night If this does not cause any aches, take daily Need blood test in 3 months See me in 3 months  Double the duloxetine Take 40 a day  I have referred to podiatry for the toenail  Follow up with psychologist down the hall    Raylene Everts, MD

## 2016-12-01 ENCOUNTER — Encounter: Payer: Self-pay | Admitting: Family Medicine

## 2016-12-01 DIAGNOSIS — E785 Hyperlipidemia, unspecified: Secondary | ICD-10-CM | POA: Insufficient documentation

## 2016-12-01 DIAGNOSIS — M8580 Other specified disorders of bone density and structure, unspecified site: Secondary | ICD-10-CM | POA: Insufficient documentation

## 2016-12-03 ENCOUNTER — Ambulatory Visit: Payer: Medicaid Other | Admitting: Family Medicine

## 2016-12-16 ENCOUNTER — Ambulatory Visit (INDEPENDENT_AMBULATORY_CARE_PROVIDER_SITE_OTHER): Payer: Medicaid Other | Admitting: Podiatry

## 2016-12-16 ENCOUNTER — Ambulatory Visit (INDEPENDENT_AMBULATORY_CARE_PROVIDER_SITE_OTHER): Payer: Medicaid Other

## 2016-12-16 DIAGNOSIS — B351 Tinea unguium: Secondary | ICD-10-CM | POA: Diagnosis not present

## 2016-12-16 DIAGNOSIS — Z79899 Other long term (current) drug therapy: Secondary | ICD-10-CM

## 2016-12-16 DIAGNOSIS — M659 Synovitis and tenosynovitis, unspecified: Secondary | ICD-10-CM

## 2016-12-16 DIAGNOSIS — M7752 Other enthesopathy of left foot: Secondary | ICD-10-CM | POA: Diagnosis not present

## 2016-12-16 DIAGNOSIS — M79672 Pain in left foot: Secondary | ICD-10-CM

## 2016-12-16 DIAGNOSIS — S99921A Unspecified injury of right foot, initial encounter: Secondary | ICD-10-CM

## 2016-12-16 DIAGNOSIS — M25572 Pain in left ankle and joints of left foot: Secondary | ICD-10-CM

## 2016-12-16 DIAGNOSIS — L603 Nail dystrophy: Secondary | ICD-10-CM

## 2016-12-16 DIAGNOSIS — M79609 Pain in unspecified limb: Secondary | ICD-10-CM

## 2016-12-16 DIAGNOSIS — M722 Plantar fascial fibromatosis: Secondary | ICD-10-CM

## 2016-12-16 DIAGNOSIS — L608 Other nail disorders: Secondary | ICD-10-CM

## 2016-12-16 MED ORDER — BETAMETHASONE SOD PHOS & ACET 6 (3-3) MG/ML IJ SUSP: 3.0000 mg | Freq: Once | INTRAMUSCULAR | Status: DC

## 2016-12-16 NOTE — Progress Notes (Signed)
   Subjective: 61 year old female presents today for multiple complaints. Patient states that she sustained a left ankle injury approximately 18 years ago. Patient states that she's had residual pain and tenderness to the left ankle ever since the initial injury. There've been no alleviating factors and is a chronic low-grade pain. Patient also presents for pain and tenderness to the plantar aspect of the left heel. This pain is been going on for several weeks and is most painful after she's been on her feet for a while. Alleviating factors include include rest. Patient also presents for treatment and evaluation of possible fungus to the right great toenail. Patient states that approximately 1 year ago she took the oral antifungal Lamisil for 90 days. She states that the left great toenail got better however the right had no change. Patient presents today further treatment and evaluation  Objective: Physical Exam General: The patient is alert and oriented x3 in no acute distress.  Dermatology: Hyperkeratotic, discolored, thickened, onychodystrophy of nails noted bilaterally.  Skin is warm, dry and supple bilateral lower extremities. Negative for open lesions or macerations.  Vascular: Palpable pedal pulses bilaterally. No edema or erythema noted. Capillary refill within normal limits.  Neurological: Epicritic and protective threshold grossly intact bilaterally.   Musculoskeletal Exam: Pain on palpation to the plantar medial aspect of the left heel at the medial calcaneal tubercle insertion of the plantar fascia.  Pain on palpation also noted to the medial aspect of the patient's left ankle joint. Range of motion within normal limits to all pedal and ankle joints bilateral. Muscle strength 5/5 in all groups bilateral.   Assessment: #1 possible onychomycosis right great toenail #2 plantar fasciitis left foot #3 ankle joint synovitis with capsulitis left  Plan of Care:  #1 Patient was  evaluated. #2 Orders for liver function tests were ordered today.  #3 Today nail biopsy was taken and sent to pathology for fungal culture. #5 injection of 0.5 mL Celestone Soluspan injected the patient's left ankle joint #6 injection of 0.5 mL Celestone Soluspan injected in the patient's left plantar fascia at the insertion of the medial calcaneal tubercle #7 return to clinic in 4 weeks for follow-up evaluation and to discuss nail biopsy and liver function results   Edrick Kins, DPM [facility]  Dr. Edrick Kins, [address]                                        [place], [place] [address]                Office [phone]  Fax [phone]

## 2016-12-17 LAB — HEPATIC FUNCTION PANEL
ALBUMIN: 4 g/dL (ref 3.6–5.1)
ALT: 17 U/L (ref 6–29)
AST: 20 U/L (ref 10–35)
Alkaline Phosphatase: 73 U/L (ref 33–130)
BILIRUBIN TOTAL: 0.4 mg/dL (ref 0.2–1.2)
Bilirubin, Direct: 0.1 mg/dL (ref <=0.2)
Indirect Bilirubin: 0.3 mg/dL (ref 0.2–1.2)
TOTAL PROTEIN: 6.6 g/dL (ref 6.1–8.1)

## 2016-12-18 NOTE — Addendum Note (Signed)
Addended by: Johnnye Lana A on: 12/18/2016 04:04 PM   Modules accepted: Orders

## 2016-12-23 ENCOUNTER — Ambulatory Visit (HOSPITAL_COMMUNITY)
Admission: RE | Admit: 2016-12-23 | Discharge: 2016-12-23 | Disposition: A | Discharge status: Home / Self Care | Payer: Medicaid Other | Source: Ambulatory Visit | Attending: Family Medicine | Admitting: Family Medicine

## 2016-12-23 DIAGNOSIS — Z1231 Encounter for screening mammogram for malignant neoplasm of breast: Secondary | ICD-10-CM | POA: Diagnosis not present

## 2016-12-28 ENCOUNTER — Encounter: Payer: Self-pay | Admitting: Family Medicine

## 2016-12-28 ENCOUNTER — Ambulatory Visit (INDEPENDENT_AMBULATORY_CARE_PROVIDER_SITE_OTHER): Payer: Medicaid Other | Admitting: Family Medicine

## 2016-12-28 VITALS — BP 134/76 | HR 92 | Temp 98.8°F | Resp 18 | Ht 63.0 in | Wt 149.0 lb

## 2016-12-28 DIAGNOSIS — F419 Anxiety disorder, unspecified: Secondary | ICD-10-CM

## 2016-12-28 DIAGNOSIS — F450 Somatization disorder: Secondary | ICD-10-CM

## 2016-12-28 DIAGNOSIS — G44209 Tension-type headache, unspecified, not intractable: Secondary | ICD-10-CM | POA: Diagnosis not present

## 2016-12-28 MED ORDER — IBUPROFEN 600 MG PO TABS: 600.0000 mg | ORAL_TABLET | Freq: Three times a day (TID) | ORAL | 0 refills | Status: DC | PRN

## 2016-12-28 NOTE — Patient Instructions (Addendum)
Try to take the full dose of lipitor  Take the ibuprofen Take with food Rest in a quiet dark room  Call for problems

## 2016-12-28 NOTE — Progress Notes (Signed)
Chief Complaint  Patient presents with  . Headache   Here for headache behind R eye Also complains of visual disturbance R>L Worried because she had a concussion on that side in 1994 No nausea or vomiting No neuro symptoms - numbness or weakness The right eye feels light sensitive No history migraine No cold or sinus symptoms  Patient Active Problem List   Diagnosis Date Noted  . HLD (hyperlipidemia) 12/01/2016  . Osteopenia determined by x-ray 12/01/2016  . Anxiety with somatization 11/02/2016  . Ventral hernia 07/21/2016  . Pelvic relaxation 12/05/2015  . Skin tag 12/05/2015  . Pelvic pain in female 12/05/2015  . Mucosal abnormality of stomach   . Constipation 10/11/2014  . Choledocholithiasis 10/11/2014    Class: Question of  . Cholelithiases 10/11/2014  . Dyslipidemia 08/21/2013  . IBS (irritable bowel syndrome)   . Anxiety   . History of Nissen fundoplication   . Bradycardia   . Burning chest pain   . RUQ pain 07/05/2013  . Early satiety 01/20/2013  . GERD 02/04/2010    Outpatient Encounter Prescriptions as of 12/28/2016  Medication Sig  . acetaminophen (TYLENOL) 500 MG tablet Take 500 mg by mouth every 6 (six) hours as needed for pain.  Marland Kitchen albuterol (PROVENTIL HFA;VENTOLIN HFA) 108 (90 Base) MCG/ACT inhaler Inhale 2 puffs into the lungs every 6 (six) hours as needed. For shortness of breath  . aspirin 81 MG tablet Take 81 mg by mouth daily.  Marland Kitchen atorvastatin (LIPITOR) 10 MG tablet Take 1 tablet (10 mg total) by mouth daily.  . calcium carbonate (TUMS - DOSED IN MG ELEMENTAL CALCIUM) 500 MG chewable tablet Chew 1-2 tablets by mouth daily as needed for indigestion or heartburn.  . Calcium Citrate (CITRACAL PO) Take 1 tablet by mouth 2 (two) times daily.   . diphenhydrAMINE (BENADRYL) 25 MG tablet Take 25 mg by mouth daily as needed for allergies.  . DULoxetine (CYMBALTA) 20 MG capsule Take 1 capsule (20 mg total) by mouth 2 (two) times daily.  . Magnesium Chloride  (MAGNESIUM DR PO) Take 1 tablet by mouth daily.  . Multiple Vitamin (MULTIVITAMIN WITH MINERALS) TABS Take 1 tablet by mouth daily.  . Omega-3 Fatty Acids (FISH OIL) 1000 MG CAPS Take 1 capsule by mouth daily.   . Probiotic Product (PROBIOTIC PO) Take by mouth daily.  Marland Kitchen ibuprofen (ADVIL,MOTRIN) 600 MG tablet Take 1 tablet (600 mg total) by mouth every 8 (eight) hours as needed.   Facility-Administered Encounter Medications as of 12/28/2016  Medication  . betamethasone acetate-betamethasone sodium phosphate (CELESTONE) injection 3 mg    Allergies  Allergen Reactions  . Ciprofloxacin Other (See Comments)    "Skin burning"- feeling, not allergy  . Codeine Nausea Only    Nausea and sweats  . Meperidine Hcl Nausea And Vomiting    GI intolerance  . Metaxalone Nausea Only    Stomach Irritation   . Naproxen Nausea Only    Stomach Irritation   . Neurontin [Gabapentin] Other (See Comments)    Blurred vision, funny feeling in head- side effect not allergy  . Simvastatin Other (See Comments)    Muscle aches in legs, fatigue- not allergic  . Sulfa Antibiotics Itching    Skin Irritation (Burning, Stinging)   . Tizanidine Other (See Comments)    Abdominal/chest pain/ "too relaxed"    Review of Systems  Constitutional: Negative for chills, fatigue and fever.  HENT: Negative for congestion, sinus pain and sinus pressure.   Eyes: Positive  for photophobia and visual disturbance.  Respiratory: Negative for shortness of breath.   Cardiovascular: Negative for chest pain and palpitations.  Gastrointestinal: Negative for diarrhea, nausea and vomiting.  Musculoskeletal: Positive for neck pain and neck stiffness.       Right  Neurological: Positive for headaches. Negative for dizziness and speech difficulty.  Psychiatric/Behavioral: Negative for sleep disturbance. The patient is not nervous/anxious.     BP 134/76 (BP Location: Right Arm, Patient Position: Sitting, Cuff Size: Normal)   Pulse 92    Temp 98.8 F (37.1 C) (Temporal)   Resp 18   Ht 5\' 3"  (1.6 m)   Wt 149 lb 0.6 oz (67.6 kg)   SpO2 100%   BMI 26.40 kg/m   Physical Exam  Constitutional: She appears well-developed and well-nourished. No distress.  anxious  HENT:  Head: Normocephalic and atraumatic.  Right Ear: External ear normal.  Left Ear: External ear normal.  Nose: Nose normal.  Mouth/Throat: Oropharynx is clear and moist. No oropharyngeal exudate.  Scar R  TM  Eyes: Conjunctivae are normal. Pupils are equal, round, and reactive to light.  Discs flat  Neck: Normal range of motion. No thyromegaly present.  Cardiovascular: Normal rate, regular rhythm and normal heart sounds.   Pulmonary/Chest: Effort normal and breath sounds normal.  Musculoskeletal: Normal range of motion. She exhibits no edema.  Lymphadenopathy:    She has no cervical adenopathy.  Neurological: She is alert. She displays normal reflexes. No cranial nerve deficit. Coordination normal.  Psychiatric: She has a normal mood and affect. Her behavior is normal. Thought content normal.    ASSESSMENT/PLAN:  Tension headache Discussed unlikely consequence from old head injury/concussion    Patient Instructions  Try to take the full dose of lipitor  Take the ibuprofen Take with food Rest in a quiet dark room  Call for problems         Raylene Everts, MD
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
## 2017-01-06 ENCOUNTER — Encounter (INDEPENDENT_AMBULATORY_CARE_PROVIDER_SITE_OTHER): Payer: Self-pay

## 2017-01-06 ENCOUNTER — Ambulatory Visit (INDEPENDENT_AMBULATORY_CARE_PROVIDER_SITE_OTHER): Payer: Medicaid Other | Admitting: Psychiatry

## 2017-01-06 ENCOUNTER — Encounter (HOSPITAL_COMMUNITY): Payer: Self-pay | Admitting: Psychiatry

## 2017-01-06 VITALS — BP 137/84 | HR 83 | Ht 63.0 in | Wt 150.2 lb

## 2017-01-06 DIAGNOSIS — J45909 Unspecified asthma, uncomplicated: Secondary | ICD-10-CM

## 2017-01-06 DIAGNOSIS — Z79899 Other long term (current) drug therapy: Secondary | ICD-10-CM | POA: Diagnosis not present

## 2017-01-06 DIAGNOSIS — E785 Hyperlipidemia, unspecified: Secondary | ICD-10-CM

## 2017-01-06 DIAGNOSIS — M797 Fibromyalgia: Secondary | ICD-10-CM | POA: Diagnosis not present

## 2017-01-06 DIAGNOSIS — F411 Generalized anxiety disorder: Secondary | ICD-10-CM | POA: Diagnosis not present

## 2017-01-06 DIAGNOSIS — F1290 Cannabis use, unspecified, uncomplicated: Secondary | ICD-10-CM

## 2017-01-06 DIAGNOSIS — K219 Gastro-esophageal reflux disease without esophagitis: Secondary | ICD-10-CM

## 2017-01-06 MED ORDER — TRAZODONE HCL 50 MG PO TABS: 50.0000 mg | ORAL_TABLET | Freq: Every day | ORAL | 1 refills | Status: DC

## 2017-01-06 MED ORDER — DULOXETINE HCL 60 MG PO CPEP: 60.0000 mg | ORAL_CAPSULE | Freq: Every day | ORAL | 1 refills | Status: DC

## 2017-01-06 NOTE — Patient Instructions (Addendum)
1. Increase duloxetine 60 mg daily 2. Start Trazodone 50 mg at night 3. Contact for therapy: Dr. Alford Highland Schneidmiller  [phone] [address] [place], [place] [address] 4. Return to clinic in one month

## 2017-01-13 ENCOUNTER — Ambulatory Visit: Payer: Medicaid Other | Admitting: Podiatry

## 2017-01-27 ENCOUNTER — Ambulatory Visit (INDEPENDENT_AMBULATORY_CARE_PROVIDER_SITE_OTHER): Payer: Medicaid Other | Admitting: Podiatry

## 2017-01-27 ENCOUNTER — Encounter: Payer: Self-pay | Admitting: Podiatry

## 2017-01-27 DIAGNOSIS — M7752 Other enthesopathy of left foot: Secondary | ICD-10-CM

## 2017-01-27 DIAGNOSIS — M25572 Pain in left ankle and joints of left foot: Secondary | ICD-10-CM | POA: Diagnosis not present

## 2017-01-27 DIAGNOSIS — L603 Nail dystrophy: Secondary | ICD-10-CM

## 2017-01-27 DIAGNOSIS — L608 Other nail disorders: Secondary | ICD-10-CM | POA: Diagnosis not present

## 2017-01-27 DIAGNOSIS — B351 Tinea unguium: Secondary | ICD-10-CM

## 2017-01-27 DIAGNOSIS — M79609 Pain in unspecified limb: Secondary | ICD-10-CM

## 2017-01-27 DIAGNOSIS — M722 Plantar fascial fibromatosis: Secondary | ICD-10-CM | POA: Diagnosis not present

## 2017-01-27 DIAGNOSIS — M659 Synovitis and tenosynovitis, unspecified: Secondary | ICD-10-CM

## 2017-01-27 MED ORDER — TERBINAFINE HCL 250 MG PO TABS: 250.0000 mg | ORAL_TABLET | Freq: Every day | ORAL | 0 refills | Status: DC

## 2017-01-29 MED ORDER — NONFORMULARY OR COMPOUNDED ITEM: 3 refills | Status: DC
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
## 2017-01-30 NOTE — Progress Notes (Signed)
   Subjective: 61 year old female presents today for further follow-up to receive [place] results, LFT panel results, left ankle pain and left plantar fasciitis. She states her pain is better and the injection in the left foot has helped with the ankle pain and swelling. She states the plantar fasciitis is better as well. If she stands on her left foot for a long period of time she reports swelling. She also states that since 12/16/16 when the nail on her right great toe gets wet it causes her pain for "a while".  Objective: Physical Exam General: The patient is alert and oriented x3 in no acute distress.  Dermatology: Hyperkeratotic, discolored, thickened, onychodystrophy of nails noted bilaterally.  Skin is warm, dry and supple bilateral lower extremities. Negative for open lesions or macerations.  Vascular: Palpable pedal pulses bilaterally. No edema or erythema noted. Capillary refill within normal limits.  Neurological: Epicritic and protective threshold grossly intact bilaterally.   Musculoskeletal Exam: Pain on palpation to the plantar medial aspect of the left heel at the medial calcaneal tubercle insertion of the plantar fascia.  Pain on palpation also noted to the medial aspect of the patient's left ankle joint. Range of motion within normal limits to all pedal and ankle joints bilateral. Muscle strength 5/5 in all groups bilateral.   Assessment: #1 possible onychomycosis right great toenail #2 plantar fasciitis left foot-resolved #3 ankle joint synovitis with capsulitis left-resolved  Plan of Care:  #1 Patient was evaluated. #2 prescription for Lamisil #3 prescription from [place] for anti-fungal nail lacquer #4 return to clinic in 4 months  Edrick Kins, DPM [facility]  Dr. Edrick Kins, [address]                                        [place], [place] [address]                Office [phone]  Fax [phone]

## 2017-02-03 ENCOUNTER — Encounter: Payer: Self-pay | Admitting: Family Medicine

## 2017-02-03 ENCOUNTER — Encounter (HOSPITAL_COMMUNITY): Payer: Self-pay | Admitting: Psychiatry

## 2017-02-03 ENCOUNTER — Ambulatory Visit (INDEPENDENT_AMBULATORY_CARE_PROVIDER_SITE_OTHER): Payer: Medicaid Other | Admitting: Psychiatry

## 2017-02-03 ENCOUNTER — Ambulatory Visit (HOSPITAL_COMMUNITY): Payer: Self-pay | Admitting: Psychiatry

## 2017-02-03 ENCOUNTER — Ambulatory Visit (INDEPENDENT_AMBULATORY_CARE_PROVIDER_SITE_OTHER): Payer: Medicaid Other | Admitting: Family Medicine

## 2017-02-03 VITALS — BP 127/72 | HR 88 | Ht 63.0 in | Wt 151.0 lb

## 2017-02-03 VITALS — BP 118/80 | HR 84 | Temp 98.1°F | Resp 18 | Ht 63.0 in | Wt 152.1 lb

## 2017-02-03 DIAGNOSIS — E785 Hyperlipidemia, unspecified: Secondary | ICD-10-CM

## 2017-02-03 DIAGNOSIS — F450 Somatization disorder: Secondary | ICD-10-CM

## 2017-02-03 DIAGNOSIS — L989 Disorder of the skin and subcutaneous tissue, unspecified: Secondary | ICD-10-CM | POA: Diagnosis not present

## 2017-02-03 DIAGNOSIS — F419 Anxiety disorder, unspecified: Secondary | ICD-10-CM

## 2017-02-03 DIAGNOSIS — K219 Gastro-esophageal reflux disease without esophagitis: Secondary | ICD-10-CM | POA: Diagnosis not present

## 2017-02-03 DIAGNOSIS — J45909 Unspecified asthma, uncomplicated: Secondary | ICD-10-CM

## 2017-02-03 DIAGNOSIS — R42 Dizziness and giddiness: Secondary | ICD-10-CM | POA: Diagnosis not present

## 2017-02-03 DIAGNOSIS — Z7982 Long term (current) use of aspirin: Secondary | ICD-10-CM | POA: Diagnosis not present

## 2017-02-03 DIAGNOSIS — F411 Generalized anxiety disorder: Secondary | ICD-10-CM

## 2017-02-03 DIAGNOSIS — M797 Fibromyalgia: Secondary | ICD-10-CM | POA: Diagnosis not present

## 2017-02-03 DIAGNOSIS — Z79899 Other long term (current) drug therapy: Secondary | ICD-10-CM

## 2017-02-03 DIAGNOSIS — H547 Unspecified visual loss: Secondary | ICD-10-CM

## 2017-02-03 MED ORDER — MECLIZINE HCL 32 MG PO TABS: 32.0000 mg | ORAL_TABLET | Freq: Three times a day (TID) | ORAL | 0 refills | Status: DC | PRN

## 2017-02-03 NOTE — Progress Notes (Signed)
Chief Complaint  Patient presents with  . Dizziness   Patient is here for an acute visit. I believe that her primary medical problem revolves around the diagnosis of anxiety with somatization. She has multiple complaints, photophobia, visual disturbance, scalp sensitivity, dizziness, difficulty sleeping. She feels this might have started when she was placed on trazodone for sleep. She took it every day for about 2 weeks, then discontinue the medication. It did not help her sleep, and she was afraid it was worsening her multitude of symptoms. She also states his symptoms are similar to when she had her "concussion". I reviewed her history with her, it appears that she was bent over in the bathroom and stood up hitting her head on a porcelain fixture or towel rod. She had no loss of consciousness. She's had prolonged symptoms intermittently for over 20 years that she relates to this minor trauma. She has an insect bite on her arm that has not healed in 2 months. She keeps picking the area. She is convinced there may be something seriously wrong. Requests referral to dermatology. Because of her photophobia and visual disturbance she would like to go back to her eye doctor. A referral is required. Patient Active Problem List   Diagnosis Date Noted  . Generalized anxiety disorder 01/06/2017  . HLD (hyperlipidemia) 12/01/2016  . Osteopenia determined by x-ray 12/01/2016  . Anxiety with somatization 11/02/2016  . Ventral hernia 07/21/2016  . Pelvic relaxation 12/05/2015  . Skin tag 12/05/2015  . Pelvic pain in female 12/05/2015  . Mucosal abnormality of stomach   . Constipation 10/11/2014  . Choledocholithiasis 10/11/2014    Class: Question of  . Cholelithiases 10/11/2014  . Dyslipidemia 08/21/2013  . IBS (irritable bowel syndrome)   . Anxiety   . History of Nissen fundoplication   . Bradycardia   . Burning chest pain   . RUQ pain 07/05/2013  . Early satiety 01/20/2013  . GERD  02/04/2010    Outpatient Encounter Prescriptions as of 02/03/2017  Medication Sig  . acetaminophen (TYLENOL) 500 MG tablet Take 500 mg by mouth every 6 (six) hours as needed for pain.  Marland Kitchen albuterol (PROVENTIL HFA;VENTOLIN HFA) 108 (90 Base) MCG/ACT inhaler Inhale 2 puffs into the lungs every 6 (six) hours as needed. For shortness of breath  . aspirin 81 MG tablet Take 81 mg by mouth daily.  Marland Kitchen atorvastatin (LIPITOR) 10 MG tablet Take 1 tablet (10 mg total) by mouth daily.  . calcium carbonate (TUMS - DOSED IN MG ELEMENTAL CALCIUM) 500 MG chewable tablet Chew 1-2 tablets by mouth daily as needed for indigestion or heartburn.  . Calcium Citrate (CITRACAL PO) Take 1 tablet by mouth 2 (two) times daily.   . diphenhydrAMINE (BENADRYL) 25 MG tablet Take 25 mg by mouth daily as needed for allergies.  . DULoxetine (CYMBALTA) 60 MG capsule Take 1 capsule (60 mg total) by mouth daily.  Marland Kitchen ibuprofen (ADVIL,MOTRIN) 600 MG tablet Take 1 tablet (600 mg total) by mouth every 8 (eight) hours as needed.  . Magnesium Chloride (MAGNESIUM DR PO) Take 1 tablet by mouth daily.  . Multiple Vitamin (MULTIVITAMIN WITH MINERALS) TABS Take 1 tablet by mouth daily.  . NONFORMULARY OR COMPOUNDED ITEM [facility]  Onychomycosis Nail Lacquer -  Fluconazole 2%, Terbinafine 1% DMSO Apply to affected nail once daily Qty. 120 gm 3 refills  . Omega-3 Fatty Acids (FISH OIL) 1000 MG CAPS Take 1 capsule by mouth daily.   . Probiotic Product (PROBIOTIC PO)  Take by mouth daily.  Marland Kitchen terbinafine (LAMISIL) 250 MG tablet Take 1 tablet (250 mg total) by mouth daily.  . meclizine (ANTIVERT) 32 MG tablet Take 1 tablet (32 mg total) by mouth 3 (three) times daily as needed.   Facility-Administered Encounter Medications as of 02/03/2017  Medication  . betamethasone acetate-betamethasone sodium phosphate (CELESTONE) injection 3 mg    Allergies  Allergen Reactions  . Trazodone And Nefazodone Other (See Comments)    Pt stated, "felt  like had a little seizure; eyes started jumping; felt weirdness in top of head; sweating and felt sick - clammy and dizziness"  . Ciprofloxacin Other (See Comments)    "Skin burning"- feeling, not allergy  . Codeine Nausea Only    Nausea and sweats  . Meperidine Hcl Nausea And Vomiting    GI intolerance  . Metaxalone Nausea Only    Stomach Irritation   . Naproxen Nausea Only    Stomach Irritation   . Neurontin [Gabapentin] Other (See Comments)    Blurred vision, funny feeling in head- side effect not allergy  . Simvastatin Other (See Comments)    Muscle aches in legs, fatigue- not allergic  . Sulfa Antibiotics Itching    Skin Irritation (Burning, Stinging)   . Tizanidine Other (See Comments)    Abdominal/chest pain/ "too relaxed"    Review of Systems  Constitutional: Negative for chills, fatigue and fever.  HENT: Negative for congestion, sinus pain and sinus pressure.   Eyes: Positive for photophobia and visual disturbance.  Respiratory: Negative for shortness of breath.   Cardiovascular: Negative for chest pain and palpitations.  Gastrointestinal: Negative for diarrhea, nausea and vomiting.  Musculoskeletal: Negative for neck pain and neck stiffness.  Neurological: Positive for dizziness and headaches. Negative for speech difficulty.  Psychiatric/Behavioral: Positive for sleep disturbance. Negative for dysphoric mood. The patient is nervous/anxious.     BP 118/80 (BP Location: Right Arm, Patient Position: Sitting, Cuff Size: Normal)   Pulse 84   Temp 98.1 F (36.7 C) (Temporal)   Resp 18   Ht 5\' 3"  (1.6 m)   Wt 152 lb 1.3 oz (69 kg)   SpO2 100%   BMI 26.94 kg/m   Physical Exam  Constitutional: She is oriented to person, place, and time. She appears well-developed and well-nourished. No distress.  Anxious. Room is darkened.  HENT:  Head: Normocephalic and atraumatic.  Right Ear: External ear normal.  Left Ear: External ear normal.  Nose: Nose normal.  Mouth/Throat:  Oropharynx is clear and moist. No oropharyngeal exudate.  Scar R  TM  Eyes: Conjunctivae are normal. Pupils are equal, round, and reactive to light.  Discs flat. Slow nystagmus with Right lateral gaze  Neck: Normal range of motion. No thyromegaly present.  Cardiovascular: Normal rate, regular rhythm and normal heart sounds.   Pulmonary/Chest: Effort normal and breath sounds normal.  Musculoskeletal: Normal range of motion. She exhibits no edema.  Lymphadenopathy:    She has no cervical adenopathy.  Neurological: She is alert and oriented to person, place, and time. She displays normal reflexes. No cranial nerve deficit. Coordination normal.  No focal neurologic findings    ASSESSMENT/PLAN:  1. Hyperlipidemia, unspecified hyperlipidemia type  - Comprehensive metabolic panel - Lipid panel  2. Skin lesion of left arm  - Ambulatory referral to Dermatology  3. Vision impairment  - Ambulatory referral to Ophthalmology  4. Vertigo Prescription for Antivert is given  5. Anxiety with somatization Follow-up with psychiatry   Patient Instructions  Try antivert  for the vertigo/dizzy feeling Drink plenty of water  referred to dermatology  Referred to eye doctor  Call for problems  Need labs and follow up in three months     Raylene Everts, MD

## 2017-02-03 NOTE — Patient Instructions (Signed)
Try antivert for the vertigo/dizzy feeling Drink plenty of water  referred to dermatology  Referred to eye doctor  Call for problems  Need labs and follow up in three months

## 2017-02-03 NOTE — Patient Instructions (Addendum)
1. Increase duloxetine 60 mg daily 2. Discontinue Trazodone 3. Contact for therapy: Dr. Alford Highland Schneidmiller  [phone] [address] [place], [place] [address] 4. Return to clinic in one month
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
## 2017-02-08 ENCOUNTER — Other Ambulatory Visit: Payer: Self-pay

## 2017-02-08 MED ORDER — MECLIZINE HCL 25 MG PO TABS: 25.0000 mg | ORAL_TABLET | Freq: Three times a day (TID) | ORAL | 0 refills | Status: DC | PRN

## 2017-02-09 ENCOUNTER — Telehealth: Payer: Self-pay

## 2017-02-09 ENCOUNTER — Telehealth: Payer: Self-pay | Admitting: Family Medicine

## 2017-02-09 ENCOUNTER — Other Ambulatory Visit: Payer: Self-pay | Admitting: Family Medicine

## 2017-02-09 DIAGNOSIS — R42 Dizziness and giddiness: Secondary | ICD-10-CM

## 2017-02-09 NOTE — Telephone Encounter (Signed)
c/o vertigo/dizziness and nausea. Wants to be referred to ENT in [place]

## 2017-02-09 NOTE — Telephone Encounter (Signed)
Will refer to ENT.  She will see doctor of my preference.

## 2017-02-09 NOTE — Telephone Encounter (Signed)
Patient called and left voice mail message because the dizziness and being nauseated is getting worse since she was seen last. She has an eye doctor exam on Friday, but she is concerned because of pain in the belly and the back of the right arm.  The pain is worse at night.  She believes that this is more serious and is questioning why she wasn't referred to heart doctor or vascular specialist.  cb  [phone]

## 2017-02-10 NOTE — Telephone Encounter (Signed)
She was examined and referred appropriately.  She has an anxiety and somatization disorder that makes her feel everything is urgent.  Please reassure her that this is not serious, we have referred to ENT.  Rest, fluids, meclizine for now.

## 2017-02-10 NOTE — Telephone Encounter (Signed)
Called pt, left message to call office.

## 2017-02-11 ENCOUNTER — Telehealth: Payer: Self-pay | Admitting: Family Medicine

## 2017-02-11 NOTE — Telephone Encounter (Signed)
Called to let patient know she is scheduled with dr.teoh 03/08/17 at 930 No answer, no voicemail.

## 2017-02-11 NOTE — Telephone Encounter (Signed)
I did this 2 d ago

## 2017-03-01 ENCOUNTER — Ambulatory Visit: Payer: Medicaid Other | Admitting: Family Medicine
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
## 2017-03-03 ENCOUNTER — Encounter: Payer: Self-pay | Admitting: Family Medicine

## 2017-03-03 ENCOUNTER — Ambulatory Visit (INDEPENDENT_AMBULATORY_CARE_PROVIDER_SITE_OTHER): Payer: Medicaid Other | Admitting: Family Medicine

## 2017-03-03 ENCOUNTER — Encounter (HOSPITAL_COMMUNITY): Payer: Self-pay | Admitting: Psychiatry

## 2017-03-03 ENCOUNTER — Ambulatory Visit (INDEPENDENT_AMBULATORY_CARE_PROVIDER_SITE_OTHER): Payer: Medicaid Other | Admitting: Psychiatry

## 2017-03-03 ENCOUNTER — Ambulatory Visit (HOSPITAL_COMMUNITY): Payer: Self-pay | Admitting: Psychiatry

## 2017-03-03 VITALS — BP 120/70 | HR 76 | Temp 98.3°F | Resp 16 | Ht 63.0 in | Wt 151.0 lb

## 2017-03-03 VITALS — BP 146/73 | HR 86 | Ht 63.0 in | Wt 153.0 lb

## 2017-03-03 DIAGNOSIS — F411 Generalized anxiety disorder: Secondary | ICD-10-CM

## 2017-03-03 DIAGNOSIS — F1290 Cannabis use, unspecified, uncomplicated: Secondary | ICD-10-CM | POA: Diagnosis not present

## 2017-03-03 DIAGNOSIS — M797 Fibromyalgia: Secondary | ICD-10-CM | POA: Diagnosis not present

## 2017-03-03 DIAGNOSIS — K219 Gastro-esophageal reflux disease without esophagitis: Secondary | ICD-10-CM | POA: Diagnosis not present

## 2017-03-03 DIAGNOSIS — H547 Unspecified visual loss: Secondary | ICD-10-CM | POA: Diagnosis not present

## 2017-03-03 DIAGNOSIS — E785 Hyperlipidemia, unspecified: Secondary | ICD-10-CM | POA: Diagnosis not present

## 2017-03-03 DIAGNOSIS — J45909 Unspecified asthma, uncomplicated: Secondary | ICD-10-CM

## 2017-03-03 DIAGNOSIS — F419 Anxiety disorder, unspecified: Secondary | ICD-10-CM

## 2017-03-03 DIAGNOSIS — Z7982 Long term (current) use of aspirin: Secondary | ICD-10-CM | POA: Diagnosis not present

## 2017-03-03 DIAGNOSIS — Z79899 Other long term (current) drug therapy: Secondary | ICD-10-CM

## 2017-03-03 DIAGNOSIS — F450 Somatization disorder: Secondary | ICD-10-CM

## 2017-03-03 DIAGNOSIS — R42 Dizziness and giddiness: Secondary | ICD-10-CM | POA: Diagnosis not present

## 2017-03-03 MED ORDER — OMEPRAZOLE 20 MG PO CPDR: 20.0000 mg | DELAYED_RELEASE_CAPSULE | Freq: Every day | ORAL | 3 refills | Status: DC

## 2017-03-03 MED ORDER — DULOXETINE HCL 60 MG PO CPEP: 60.0000 mg | ORAL_CAPSULE | Freq: Every day | ORAL | 2 refills | Status: DC

## 2017-03-03 NOTE — Progress Notes (Signed)
Chief Complaint  Patient presents with  . Follow-up   Follow up Multiple vague complaints Normal ophthalmology exam Due to see ENT on Monday Wants neurology referral She has vision impairment Vertigo Scalp "pulls" "sick" when turns head  I still believe that she is likely somatic due to her anxiety condition as no pathology or findings usually exist to explain her complaints  Patient Active Problem List   Diagnosis Date Noted  . Generalized anxiety disorder 01/06/2017  . HLD (hyperlipidemia) 12/01/2016  . Osteopenia determined by x-ray 12/01/2016  . Anxiety with somatization 11/02/2016  . Ventral hernia 07/21/2016  . Pelvic relaxation 12/05/2015  . Pelvic pain in female 12/05/2015  . Mucosal abnormality of stomach   . Constipation 10/11/2014  . Choledocholithiasis 10/11/2014    Class: Question of  . IBS (irritable bowel syndrome)   . History of Nissen fundoplication   . GERD 02/04/2010    Outpatient Encounter Prescriptions as of 03/03/2017  Medication Sig  . acetaminophen (TYLENOL) 500 MG tablet Take 500 mg by mouth every 6 (six) hours as needed for pain.  Marland Kitchen albuterol (PROVENTIL HFA;VENTOLIN HFA) 108 (90 Base) MCG/ACT inhaler Inhale 2 puffs into the lungs every 6 (six) hours as needed. For shortness of breath  . aspirin 81 MG tablet Take 81 mg by mouth daily.  Marland Kitchen atorvastatin (LIPITOR) 10 MG tablet Take 1 tablet (10 mg total) by mouth daily.  . calcium carbonate (TUMS - DOSED IN MG ELEMENTAL CALCIUM) 500 MG chewable tablet Chew 1-2 tablets by mouth daily as needed for indigestion or heartburn.  . Calcium Citrate (CITRACAL PO) Take 1 tablet by mouth 2 (two) times daily.   . diphenhydrAMINE (BENADRYL) 25 MG tablet Take 25 mg by mouth daily as needed for allergies.  . DULoxetine (CYMBALTA) 60 MG capsule Take 1 capsule (60 mg total) by mouth daily.  Marland Kitchen ibuprofen (ADVIL,MOTRIN) 600 MG tablet Take 1 tablet (600 mg total) by mouth every 8 (eight) hours as needed.  . Magnesium  Chloride (MAGNESIUM DR PO) Take 1 tablet by mouth daily.  . meclizine (ANTIVERT) 25 MG tablet Take 1 tablet (25 mg total) by mouth 3 (three) times daily as needed for dizziness.  . Multiple Vitamin (MULTIVITAMIN WITH MINERALS) TABS Take 1 tablet by mouth daily.  . NONFORMULARY OR COMPOUNDED ITEM [facility]  Onychomycosis Nail Lacquer -  Fluconazole 2%, Terbinafine 1% DMSO Apply to affected nail once daily Qty. 120 gm 3 refills  . Omega-3 Fatty Acids (FISH OIL) 1000 MG CAPS Take 1 capsule by mouth daily.   . Probiotic Product (PROBIOTIC PO) Take by mouth daily.  Marland Kitchen terbinafine (LAMISIL) 250 MG tablet Take 1 tablet (250 mg total) by mouth daily.  Marland Kitchen omeprazole (PRILOSEC) 20 MG capsule Take 1 capsule (20 mg total) by mouth daily.   Facility-Administered Encounter Medications as of 03/03/2017  Medication  . betamethasone acetate-betamethasone sodium phosphate (CELESTONE) injection 3 mg    Allergies  Allergen Reactions  . Trazodone And Nefazodone Other (See Comments)    Pt stated, "felt like had a little seizure; eyes started jumping; felt weirdness in top of head; sweating and felt sick - clammy and dizziness"  . Ciprofloxacin Other (See Comments)    "Skin burning"- feeling, not allergy  . Codeine Nausea Only    Nausea and sweats  . Meperidine Hcl Nausea And Vomiting    GI intolerance  . Metaxalone Nausea Only    Stomach Irritation   . Naproxen Nausea Only    Stomach  Irritation   . Neurontin [Gabapentin] Other (See Comments)    Blurred vision, funny feeling in head- side effect not allergy  . Simvastatin Other (See Comments)    Muscle aches in legs, fatigue- not allergic  . Sulfa Antibiotics Itching    Skin Irritation (Burning, Stinging)   . Tizanidine Other (See Comments)    Abdominal/chest pain/ "too relaxed"    Review of Systems  Constitutional: Negative for chills, fatigue and fever.  HENT: Negative for congestion, sinus pain and sinus pressure.   Eyes: Positive for  photophobia and visual disturbance.  Respiratory: Negative for shortness of breath.   Cardiovascular: Negative for chest pain and palpitations.  Gastrointestinal: Positive for nausea. Negative for diarrhea and vomiting.  Musculoskeletal: Negative for neck pain and neck stiffness.  Neurological: Positive for dizziness and headaches. Negative for speech difficulty.  Psychiatric/Behavioral: Positive for sleep disturbance. Negative for dysphoric mood. The patient is nervous/anxious.    BP 120/70 (BP Location: Right Arm, Patient Position: Sitting, Cuff Size: Normal)   Pulse 76   Temp 98.3 F (36.8 C) (Temporal)   Resp 16   Ht 5\' 3"  (1.6 m)   Wt 151 lb (68.5 kg)   BMI 26.75 kg/m   Physical Exam  Constitutional: She is oriented to person, place, and time. She appears well-developed and well-nourished.  HENT:  Head: Normocephalic and atraumatic.  Mouth/Throat: Oropharynx is clear and moist.  Neurological: She is alert and oriented to person, place, and time.  Psychiatric: Her behavior is normal.  anxious    ASSESSMENT/PLAN:  1. Vertigo  - Ambulatory referral to Neurology  2. Vision impairment  - Ambulatory referral to Neurology  3. Generalized anxiety disorder   4. Anxiety with somatization    Patient Instructions  Please call for eye doctor report  See Dr Benjamine Mola 03/08/17 at 930  I have refilled the omeprazole  See me on usual schedule     Raylene Everts, MD

## 2017-03-03 NOTE — Patient Instructions (Addendum)
1. Continue duloxetine 60 mg daily 2. Return to clinic in two months

## 2017-03-03 NOTE — Patient Instructions (Addendum)
Please call for eye doctor report  See Dr Benjamine Mola 03/08/17 at 930  I have refilled the omeprazole  See me on usual schedule

## 2017-03-13 ENCOUNTER — Ambulatory Visit (INDEPENDENT_AMBULATORY_CARE_PROVIDER_SITE_OTHER): Payer: Self-pay | Admitting: Otolaryngology

## 2017-03-15 ENCOUNTER — Ambulatory Visit (INDEPENDENT_AMBULATORY_CARE_PROVIDER_SITE_OTHER): Payer: Medicaid Other | Admitting: Otolaryngology

## 2017-03-15 DIAGNOSIS — H9209 Otalgia, unspecified ear: Secondary | ICD-10-CM | POA: Diagnosis not present

## 2017-03-15 DIAGNOSIS — J310 Chronic rhinitis: Secondary | ICD-10-CM

## 2017-03-15 DIAGNOSIS — R070 Pain in throat: Secondary | ICD-10-CM | POA: Diagnosis not present

## 2017-03-15 DIAGNOSIS — K219 Gastro-esophageal reflux disease without esophagitis: Secondary | ICD-10-CM

## 2017-03-16 ENCOUNTER — Telehealth: Payer: Self-pay

## 2017-03-16 NOTE — Telephone Encounter (Signed)
This is my second message about her calling about a neurology referral.  It was done 2 weeks ago.

## 2017-03-16 NOTE — Telephone Encounter (Signed)
Pt called and saw a dr Truman Hayward?  Was wondering if you had gotten the records, and also asking to see a nuerologist

## 2017-03-17 ENCOUNTER — Telehealth: Payer: Self-pay | Admitting: Family Medicine

## 2017-03-17 NOTE — Telephone Encounter (Signed)
I spoke with patient this morning to about Neurology referral, she has called twice to check the status of this so I called to let her know that the referral was sent 03/04/17 to Dr.Doonquah's office, it takes a little longer with his referrals due to the office administrator also being the referral coordinator, they have her referral and will be calling her when they get the chance. She was given their # to followup as well.
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
## 2017-05-07 ENCOUNTER — Ambulatory Visit (HOSPITAL_COMMUNITY): Payer: Medicaid Other | Admitting: Psychiatry

## 2017-05-07 ENCOUNTER — Ambulatory Visit: Payer: Self-pay | Admitting: Family Medicine

## 2017-05-10 ENCOUNTER — Other Ambulatory Visit (HOSPITAL_COMMUNITY): Payer: Self-pay | Admitting: Neurology

## 2017-05-10 DIAGNOSIS — R569 Unspecified convulsions: Secondary | ICD-10-CM

## 2017-05-18 ENCOUNTER — Encounter: Payer: Self-pay | Admitting: Family Medicine

## 2017-05-18 LAB — COMPREHENSIVE METABOLIC PANEL
ALK PHOS: 75 U/L (ref 33–130)
ALT: 14 U/L (ref 6–29)
AST: 17 U/L (ref 10–35)
Albumin: 4 g/dL (ref 3.6–5.1)
BUN: 8 mg/dL (ref 7–25)
CALCIUM: 9.1 mg/dL (ref 8.6–10.4)
CHLORIDE: 105 mmol/L (ref 98–110)
CO2: 28 mmol/L (ref 20–31)
Creat: 0.71 mg/dL (ref 0.50–1.05)
GLUCOSE: 90 mg/dL (ref 65–99)
POTASSIUM: 4.1 mmol/L (ref 3.5–5.3)
Sodium: 140 mmol/L (ref 135–146)
Total Bilirubin: 0.2 mg/dL (ref 0.2–1.2)
Total Protein: 6.6 g/dL (ref 6.1–8.1)

## 2017-05-18 LAB — LIPID PANEL
CHOL/HDL RATIO: 3.5 ratio (ref <5.0)
Cholesterol: 168 mg/dL (ref <200)
HDL: 48 mg/dL — AB (ref >50)
LDL Cholesterol: 107 mg/dL — ABNORMAL HIGH (ref <100)
Triglycerides: 64 mg/dL (ref <150)
VLDL: 13 mg/dL (ref <30)

## 2017-05-20 ENCOUNTER — Telehealth: Payer: Self-pay | Admitting: Family Medicine

## 2017-05-20 NOTE — Telephone Encounter (Signed)
I have attempted to call Destiny Young x 2, her voice mail is not set up.

## 2017-05-20 NOTE — Telephone Encounter (Signed)
New Message  Pt voiced wanting to speak to nurse in regards to MRI or [place] in [place] appointment.  Please f/u with pt

## 2017-05-21 ENCOUNTER — Ambulatory Visit (HOSPITAL_COMMUNITY): Payer: Medicaid Other

## 2017-05-21 ENCOUNTER — Ambulatory Visit: Payer: Self-pay | Admitting: Family Medicine

## 2017-05-28 ENCOUNTER — Ambulatory Visit (INDEPENDENT_AMBULATORY_CARE_PROVIDER_SITE_OTHER): Payer: Medicaid Other | Admitting: Family Medicine

## 2017-05-28 ENCOUNTER — Telehealth: Payer: Self-pay | Admitting: Family Medicine

## 2017-05-28 ENCOUNTER — Ambulatory Visit (HOSPITAL_COMMUNITY)
Admission: RE | Admit: 2017-05-28 | Discharge: 2017-05-28 | Disposition: A | Discharge status: Home / Self Care | Payer: Medicaid Other | Source: Ambulatory Visit | Attending: Neurology | Admitting: Neurology

## 2017-05-28 ENCOUNTER — Encounter: Payer: Self-pay | Admitting: Family Medicine

## 2017-05-28 VITALS — BP 122/68 | HR 91 | Temp 99.0°F | Resp 16 | Ht 62.75 in | Wt 152.5 lb

## 2017-05-28 DIAGNOSIS — E785 Hyperlipidemia, unspecified: Secondary | ICD-10-CM

## 2017-05-28 DIAGNOSIS — G40909 Epilepsy, unspecified, not intractable, without status epilepticus: Secondary | ICD-10-CM | POA: Diagnosis not present

## 2017-05-28 DIAGNOSIS — R569 Unspecified convulsions: Secondary | ICD-10-CM | POA: Insufficient documentation

## 2017-05-28 DIAGNOSIS — F411 Generalized anxiety disorder: Secondary | ICD-10-CM | POA: Diagnosis not present

## 2017-05-28 DIAGNOSIS — R42 Dizziness and giddiness: Secondary | ICD-10-CM | POA: Diagnosis not present

## 2017-05-28 NOTE — Patient Instructions (Signed)
No change in medicines  Continue to stay active as you can manage  You are doing well  See me in six months  Call sooner for problems

## 2017-05-28 NOTE — Progress Notes (Signed)
Chief Complaint  Patient presents with  . Follow-up   Here for follow up Is feeling better Has seen a GI surgeon at [facility] and is proposed to have surgery for her hernia once cleared by Dr Merlene Laughter Sees Dr Merlene Laughter for neurology.  Her EEG showed epileptiform activity.  She takes keppra.  Feels better.  He did a MRI brain today.  Results are in - it is normal.  Patient  Advised. Not as much dizziness. Weight is stable Feels better she knows what is wrong with her an d is working on recovery  Patient Active Problem List   Diagnosis Date Noted  . Generalized anxiety disorder 01/06/2017  . HLD (hyperlipidemia) 12/01/2016  . Osteopenia determined by x-ray 12/01/2016  . Anxiety with somatization 11/02/2016  . Ventral hernia 07/21/2016  . Pelvic relaxation 12/05/2015  . Pelvic pain in female 12/05/2015  . Mucosal abnormality of stomach   . Constipation 10/11/2014  . Choledocholithiasis 10/11/2014    Class: Question of  . IBS (irritable bowel syndrome)   . History of Nissen fundoplication   . GERD 02/04/2010    Outpatient Encounter Prescriptions as of 05/28/2017  Medication Sig  . acetaminophen (TYLENOL) 500 MG tablet Take 500 mg by mouth every 6 (six) hours as needed for pain.  Marland Kitchen albuterol (PROVENTIL HFA;VENTOLIN HFA) 108 (90 Base) MCG/ACT inhaler Inhale 2 puffs into the lungs every 6 (six) hours as needed. For shortness of breath  . aspirin 81 MG tablet Take 81 mg by mouth daily.  Marland Kitchen atorvastatin (LIPITOR) 10 MG tablet Take 1 tablet (10 mg total) by mouth daily.  . calcium carbonate (TUMS - DOSED IN MG ELEMENTAL CALCIUM) 500 MG chewable tablet Chew 1-2 tablets by mouth daily as needed for indigestion or heartburn.  . Calcium Citrate (CITRACAL PO) Take 1 tablet by mouth 2 (two) times daily.   . diphenhydrAMINE (BENADRYL) 25 MG tablet Take 25 mg by mouth daily as needed for allergies.  Marland Kitchen ibuprofen (ADVIL,MOTRIN) 600 MG tablet Take 1 tablet (600 mg total) by mouth every 8  (eight) hours as needed.  . levETIRAcetam (KEPPRA) 250 MG tablet Take 250 mg by mouth 2 (two) times daily.  . Magnesium Chloride (MAGNESIUM DR PO) Take 1 tablet by mouth daily.  . meclizine (ANTIVERT) 25 MG tablet Take 1 tablet (25 mg total) by mouth 3 (three) times daily as needed for dizziness.  . Multiple Vitamin (MULTIVITAMIN WITH MINERALS) TABS Take 1 tablet by mouth daily.  . NONFORMULARY OR COMPOUNDED ITEM [facility]  Onychomycosis Nail Lacquer -  Fluconazole 2%, Terbinafine 1% DMSO Apply to affected nail once daily Qty. 120 gm 3 refills  . Omega-3 Fatty Acids (FISH OIL) 1000 MG CAPS Take 1 capsule by mouth daily.   Marland Kitchen omeprazole (PRILOSEC) 20 MG capsule Take 1 capsule (20 mg total) by mouth daily.  . Probiotic Product (PROBIOTIC PO) Take by mouth daily.  Marland Kitchen terbinafine (LAMISIL) 250 MG tablet Take 1 tablet (250 mg total) by mouth daily.     Allergies  Allergen Reactions  . Ciprofloxacin Other (See Comments)    "Skin burning"- feeling, not allergy  . Codeine Nausea Only    Nausea and sweats  . Meperidine Hcl Nausea And Vomiting    GI intolerance  . Metaxalone Nausea Only    Stomach Irritation   . Naproxen Nausea Only    Stomach Irritation   . Neurontin [Gabapentin] Other (See Comments)    Blurred vision, funny feeling in head- side effect  not allergy  . Simvastatin Other (See Comments)    Muscle aches in legs, fatigue- not allergic  . Sulfa Antibiotics Itching    Skin Irritation (Burning, Stinging)   . Tizanidine Other (See Comments)    Abdominal/chest pain/ "too relaxed"  . Trazodone And Nefazodone Other (See Comments)    Pt stated, "felt like had a little seizure; eyes started jumping; felt weirdness in top of head; sweating and felt sick - clammy and dizziness"    Review of Systems  Constitutional: Negative for chills, fatigue and fever.  HENT: Negative for congestion, sinus pain and sinus pressure.   Eyes: Positive for visual disturbance. Negative for  photophobia.       Improved  Respiratory: Negative for shortness of breath.   Cardiovascular: Negative for chest pain and palpitations.  Gastrointestinal: Negative for diarrhea, nausea and vomiting.  Musculoskeletal: Negative for neck pain and neck stiffness.  Neurological: Negative for dizziness, speech difficulty and headaches.  Psychiatric/Behavioral: Positive for sleep disturbance. Negative for dysphoric mood. The patient is nervous/anxious.        Stopped own cymbalta    BP 122/68 (BP Location: Left Arm, Patient Position: Sitting, Cuff Size: Normal)   Pulse 91   Temp 99 F (37.2 C) (Other (Comment))   Resp 16   Ht 5' 2.75" (1.594 m)   Wt 152 lb 8 oz (69.2 kg)   SpO2 98%   BMI 27.23 kg/m   Physical Exam  Constitutional: She is oriented to person, place, and time. She appears well-developed and well-nourished.  HENT:  Head: Normocephalic and atraumatic.  Mouth/Throat: Oropharynx is clear and moist.  Eyes: Pupils are equal, round, and reactive to light. Conjunctivae are normal.  Neck: Normal range of motion.  Cardiovascular: Normal rate, regular rhythm and normal heart sounds.   Pulmonary/Chest: Effort normal and breath sounds normal.  Lymphadenopathy:    She has no cervical adenopathy.  Neurological: She is alert and oriented to person, place, and time.  Psychiatric: Her behavior is normal.  anxious    ASSESSMENT/PLAN:  1. Vertigo  2. Generalized anxiety disorder  3. Dyslipidemia    Patient Instructions  No change in medicines  Continue to stay active as you can manage  You are doing well  See me in six months  Call sooner for problems   Raylene Everts, MD

## 2017-05-28 NOTE — Telephone Encounter (Signed)
Patient's next appt with Dr. Meda Coffee is 11-29-16 @ 1:40.  She is asking if she is to do blood work prior to her visit then.    pts cb  [phone]

## 2017-05-31 NOTE — Telephone Encounter (Signed)
Called Destiny Young, aware she will need labs prior to next visit.

## 2017-06-02 ENCOUNTER — Ambulatory Visit: Payer: Medicaid Other | Admitting: Podiatry

## 2017-07-14 ENCOUNTER — Telehealth: Payer: Self-pay | Admitting: Family Medicine

## 2017-07-14 NOTE — Telephone Encounter (Signed)
Patient is requesting a referral to Cardiologist because of history of chest pain.  Not happening at the moment, but she experiences this when laying on her R side and also wakes her from sleep.

## 2017-07-15 NOTE — Telephone Encounter (Signed)
Needs an appointment to evaluate

## 2017-07-15 NOTE — Telephone Encounter (Signed)
Called patient back to make appt but voice mail is not set up, unable to leave message.

## 2017-07-22 ENCOUNTER — Telehealth: Payer: Self-pay | Admitting: Family Medicine
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
## 2017-07-27 NOTE — Telephone Encounter (Signed)
Does she use a cpap?

## 2017-07-27 NOTE — Telephone Encounter (Signed)
She had a sleep visit in 2003 , this is the only reference I can find in her chart.  She never told me.  We need sleep records to document

## 2017-07-28 NOTE — Telephone Encounter (Signed)
Called pt, states she DOES NOT use a cpap.

## 2017-07-30 ENCOUNTER — Ambulatory Visit: Payer: Self-pay | Admitting: Family Medicine

## 2017-09-13 ENCOUNTER — Other Ambulatory Visit: Payer: Self-pay | Admitting: Family Medicine

## 2017-09-13 NOTE — Telephone Encounter (Signed)
Seen 8 3 18

## 2017-10-14 ENCOUNTER — Other Ambulatory Visit (HOSPITAL_COMMUNITY): Payer: Self-pay | Admitting: Neurology

## 2017-10-14 DIAGNOSIS — G8929 Other chronic pain: Secondary | ICD-10-CM

## 2017-10-14 DIAGNOSIS — M545 Low back pain: Principal | ICD-10-CM

## 2017-10-14 DIAGNOSIS — M5416 Radiculopathy, lumbar region: Secondary | ICD-10-CM

## 2017-11-01 ENCOUNTER — Ambulatory Visit (HOSPITAL_COMMUNITY)
Admission: RE | Admit: 2017-11-01 | Discharge: 2017-11-01 | Disposition: A | Discharge status: Home / Self Care | Payer: Medicaid Other | Source: Ambulatory Visit | Attending: Neurology | Admitting: Neurology

## 2017-11-01 DIAGNOSIS — M5416 Radiculopathy, lumbar region: Secondary | ICD-10-CM

## 2017-11-01 DIAGNOSIS — M4317 Spondylolisthesis, lumbosacral region: Secondary | ICD-10-CM | POA: Insufficient documentation

## 2017-11-01 DIAGNOSIS — G8929 Other chronic pain: Secondary | ICD-10-CM | POA: Insufficient documentation

## 2017-11-01 DIAGNOSIS — M5126 Other intervertebral disc displacement, lumbar region: Secondary | ICD-10-CM | POA: Diagnosis not present

## 2017-11-01 DIAGNOSIS — M545 Low back pain: Secondary | ICD-10-CM

## 2017-11-01 DIAGNOSIS — M5136 Other intervertebral disc degeneration, lumbar region: Secondary | ICD-10-CM | POA: Insufficient documentation

## 2017-11-01 DIAGNOSIS — M5440 Lumbago with sciatica, unspecified side: Secondary | ICD-10-CM | POA: Insufficient documentation

## 2017-11-15 ENCOUNTER — Ambulatory Visit: Payer: Medicaid Other | Admitting: Family Medicine

## 2017-11-15 ENCOUNTER — Other Ambulatory Visit: Payer: Self-pay

## 2017-11-15 ENCOUNTER — Telehealth: Payer: Self-pay | Admitting: Family Medicine

## 2017-11-15 ENCOUNTER — Encounter: Payer: Self-pay | Admitting: Family Medicine

## 2017-11-15 VITALS — BP 122/76 | HR 88 | Temp 98.8°F | Resp 16 | Ht 63.0 in | Wt 138.8 lb

## 2017-11-15 DIAGNOSIS — R140 Abdominal distension (gaseous): Secondary | ICD-10-CM

## 2017-11-15 DIAGNOSIS — R0789 Other chest pain: Secondary | ICD-10-CM | POA: Diagnosis not present

## 2017-11-15 DIAGNOSIS — Z8739 Personal history of other diseases of the musculoskeletal system and connective tissue: Secondary | ICD-10-CM

## 2017-11-15 NOTE — Progress Notes (Signed)
Chief Complaint  Patient presents with  . Extremity Weakness  . Anxiety  . Heartburn   Patient is here for abdominal pain, abdominal distention, change in appetite, weight loss.  She has chronic anxiety.  She has noticed a bulge in her abdomen after surgery a couple of months ago.  She thinks she has a hernia.  She has not yet called her GI surgeon.  She had a remote Nissen fundal location that was read on because of bloating and inability to belch.  She states she still cannot belch.  She still feels bloated.  She is having abdominal pain shoots through to her back.  Her abdomen feels distended.  She states that her arms and legs feel weak.  She thinks this is from her surgery.  She also has been having some fleeting pressure in her chest.  She thinks is "gas".  No history of heart problems.  No exertional chest pain.  The pain is fleeting and a pressure sensation more towards her epigastrium.  No shortness of breath.  No dizziness.  No radiation.  Patient Active Problem List   Diagnosis Date Noted  . Seizure disorder ([place]) 05/28/2017  . Generalized anxiety disorder 01/06/2017  . HLD (hyperlipidemia) 12/01/2016  . Osteopenia determined by x-ray 12/01/2016  . Anxiety with somatization 11/02/2016  . Ventral hernia 07/21/2016  . Pelvic relaxation 12/05/2015  . Pelvic pain in female 12/05/2015  . Mucosal abnormality of stomach   . Constipation 10/11/2014  . Choledocholithiasis 10/11/2014    Class: Question of  . IBS (irritable bowel syndrome)   . History of Nissen fundoplication   . GERD 02/04/2010    Outpatient Encounter Medications as of 11/15/2017  Medication Sig  . acetaminophen (TYLENOL) 500 MG tablet Take 500 mg by mouth every 6 (six) hours as needed for pain.  Marland Kitchen aspirin 81 MG tablet Take 81 mg by mouth daily.  Marland Kitchen atorvastatin (LIPITOR) 10 MG tablet Take 1 tablet (10 mg total) by mouth daily.  . calcium carbonate (TUMS - DOSED IN MG ELEMENTAL CALCIUM) 500 MG chewable tablet  Chew 1-2 tablets by mouth daily as needed for indigestion or heartburn.  . Calcium Citrate (CITRACAL PO) Take 1 tablet by mouth 2 (two) times daily.   . diphenhydrAMINE (BENADRYL) 25 MG tablet Take 25 mg by mouth daily as needed for allergies.  . Magnesium Chloride (MAGNESIUM DR PO) Take 1 tablet by mouth daily.  . Multiple Vitamin (MULTIVITAMIN WITH MINERALS) TABS Take 1 tablet by mouth daily.  . Omega-3 Fatty Acids (FISH OIL) 1000 MG CAPS Take 1 capsule by mouth daily.   Marland Kitchen PROVENTIL HFA 108 (90 Base) MCG/ACT inhaler INHALE TWO PUFFS BY MOUTH EVERY 6 HOURS AS NEEDED FOR SHORTNESS OF BREATH  . ibuprofen (ADVIL,MOTRIN) 600 MG tablet Take 1 tablet (600 mg total) by mouth every 8 (eight) hours as needed. (Patient not taking: Reported on 11/15/2017)  . levETIRAcetam (KEPPRA) 250 MG tablet Take 250 mg by mouth 2 (two) times daily.  . meclizine (ANTIVERT) 25 MG tablet Take 1 tablet (25 mg total) by mouth 3 (three) times daily as needed for dizziness. (Patient not taking: Reported on 11/15/2017)  . omeprazole (PRILOSEC) 20 MG capsule Take 1 capsule (20 mg total) by mouth daily. (Patient not taking: Reported on 11/15/2017)  . Probiotic Product (PROBIOTIC PO) Take by mouth daily.  Marland Kitchen terbinafine (LAMISIL) 250 MG tablet Take 1 tablet (250 mg total) by mouth daily. (Patient not taking: Reported on 11/15/2017)  . [DISCONTINUED]  NONFORMULARY OR COMPOUNDED ITEM [facility]  Onychomycosis Nail Lacquer -  Fluconazole 2%, Terbinafine 1% DMSO Apply to affected nail once daily Qty. 120 gm 3 refills (Patient not taking: Reported on 11/15/2017)   Facility-Administered Encounter Medications as of 11/15/2017  Medication  . betamethasone acetate-betamethasone sodium phosphate (CELESTONE) injection 3 mg    Allergies  Allergen Reactions  . Ciprofloxacin Other (See Comments)    "Skin burning"- feeling, not allergy  . Codeine Nausea Only    Nausea and sweats  . Meperidine Hcl Nausea And Vomiting    GI intolerance   . Metaxalone Nausea Only    Stomach Irritation   . Naproxen Nausea Only    Stomach Irritation   . Neurontin [Gabapentin] Other (See Comments)    Blurred vision, funny feeling in head- side effect not allergy  . Simvastatin Other (See Comments)    Muscle aches in legs, fatigue- not allergic  . Sulfa Antibiotics Itching    Skin Irritation (Burning, Stinging)   . Tizanidine Other (See Comments)    Abdominal/chest pain/ "too relaxed"  . Trazodone And Nefazodone Other (See Comments)    Pt stated, "felt like had a little seizure; eyes started jumping; felt weirdness in top of head; sweating and felt sick - clammy and dizziness"    Review of Systems  Constitutional: Positive for unexpected weight change. Negative for activity change and appetite change.       Weight loss  HENT: Negative for congestion, dental problem and trouble swallowing.   Eyes: Negative for photophobia and visual disturbance.  Respiratory: Negative for cough, shortness of breath and wheezing.   Cardiovascular: Positive for chest pain. Negative for palpitations.  Gastrointestinal: Positive for abdominal distention and abdominal pain. Negative for constipation, diarrhea and nausea.  Genitourinary: Negative for difficulty urinating, flank pain and frequency.  Musculoskeletal: Negative for arthralgias, back pain and gait problem.  Skin: Negative for color change and rash.  Neurological: Positive for weakness. Negative for facial asymmetry and headaches.       States weakness in arms and legs    BP 122/76 (BP Location: Left Arm, Patient Position: Sitting, Cuff Size: Normal)   Pulse 88   Temp 98.8 F (37.1 C) (Temporal)   Resp 16   Ht 5\' 3"  (1.6 m)   Wt 138 lb 12 oz (62.9 kg)   SpO2 98%   BMI 24.58 kg/m   Physical Exam  Constitutional: She appears well-developed and well-nourished. She appears distressed.  Appears worried, emotional distress  HENT:  Head: Normocephalic and atraumatic.  Right Ear: External ear  normal.  Left Ear: External ear normal.  Nose: Nose normal.  Mouth/Throat: Oropharynx is clear and moist.  Eyes: Conjunctivae are normal. Pupils are equal, round, and reactive to light.  Neck: Normal range of motion. Neck supple.  Cardiovascular: Normal rate, regular rhythm and normal heart sounds.  Pulmonary/Chest: Effort normal and breath sounds normal. She has no wheezes.  Abdominal: Soft. Bowel sounds are normal. She exhibits no distension.  Well-healed surgical ports.  Bulge with mid abdomen above umbilicus is prominent with standing, approximately 6 cm across.  Query hernia.  Midepigastric tenderness.  No organomegaly   Musculoskeletal: Normal range of motion. She exhibits no edema.  Musculoskeletal exam reveals normal strength sensation range of motion reflexes in all 4 extremities  Psychiatric: Her mood appears anxious. Cognition and memory are normal. She exhibits a depressed mood.  Patient tends to somaticize and have excessive worry of her complaints.    ASSESSMENT/PLAN:  1.  History of weakness of extremity Normal physical examination - COMPLETE METABOLIC PANEL WITH GFR - CBC with Differential/Platelet - Urinalysis, Routine w reflex microscopic - Lipase - CK (Creatine Kinase) - TSH  2. Atypical chest pain EKG normal -  3. Abdominal distension Normal postop changes with possible hernia.  Follow-up with surgeon - Greater than 50% of this visit was spent in counseling and coordinating care.  Total face to face time: 30 minutes spent listening to patient's complaints, and reassuring her.    Patient Instructions  Drink plenty of water Need lab tests I will send you a letter with your test results.  If there is anything of concern, we will call right away. Go back to your GI surgeon as soon as you can get an appointment   Destiny Everts, MD

## 2017-11-15 NOTE — Patient Instructions (Signed)
Drink plenty of water Need lab tests I will send you a letter with your test results.  If there is anything of concern, we will call right away. Go back to your GI surgeon as soon as you can get an appointment

## 2017-11-16 LAB — COMPLETE METABOLIC PANEL WITH GFR
AG Ratio: 1.6 (calc) (ref 1.0–2.5)
ALBUMIN MSPROF: 4.5 g/dL (ref 3.6–5.1)
ALKALINE PHOSPHATASE (APISO): 74 U/L (ref 33–130)
ALT: 14 U/L (ref 6–29)
AST: 20 U/L (ref 10–35)
BUN: 10 mg/dL (ref 7–25)
CALCIUM: 10 mg/dL (ref 8.6–10.4)
CO2: 29 mmol/L (ref 20–32)
CREATININE: 0.83 mg/dL (ref 0.50–0.99)
Chloride: 104 mmol/L (ref 98–110)
GFR, EST NON AFRICAN AMERICAN: 77 mL/min/{1.73_m2} (ref >=60)
GFR, Est African American: 89 mL/min/{1.73_m2} (ref >=60)
GLOBULIN: 2.9 g/dL (ref 1.9–3.7)
Glucose, Bld: 100 mg/dL — ABNORMAL HIGH (ref 65–99)
POTASSIUM: 4.3 mmol/L (ref 3.5–5.3)
SODIUM: 141 mmol/L (ref 135–146)
Total Bilirubin: 0.4 mg/dL (ref 0.2–1.2)
Total Protein: 7.4 g/dL (ref 6.1–8.1)

## 2017-11-16 LAB — URINALYSIS, ROUTINE W REFLEX MICROSCOPIC
BILIRUBIN URINE: NEGATIVE
Bacteria, UA: NONE SEEN /HPF
GLUCOSE, UA: NEGATIVE
HYALINE CAST: NONE SEEN /LPF
Hgb urine dipstick: NEGATIVE
Ketones, ur: NEGATIVE
Leukocytes, UA: NEGATIVE
NITRITE: NEGATIVE
PROTEIN: NEGATIVE
RBC / HPF: NONE SEEN /HPF (ref 0–2)
Specific Gravity, Urine: 1.018 (ref 1.001–1.03)
Squamous Epithelial / LPF: NONE SEEN /HPF (ref <=5)
WBC, UA: NONE SEEN /HPF (ref 0–5)
pH: 5.5 (ref 5.0–8.0)

## 2017-11-16 LAB — CBC WITH DIFFERENTIAL/PLATELET
Basophils Absolute: 31 cells/uL (ref 0–200)
Basophils Relative: 0.6 %
Eosinophils Absolute: 31 cells/uL (ref 15–500)
Eosinophils Relative: 0.6 %
HCT: 38.1 % (ref 35.0–45.0)
Hemoglobin: 12.6 g/dL (ref 11.7–15.5)
Lymphs Abs: 2148 cells/uL (ref 850–3900)
MCH: 29.9 pg (ref 27.0–33.0)
MCHC: 33.1 g/dL (ref 32.0–36.0)
MCV: 90.5 fL (ref 80.0–100.0)
MONOS PCT: 6.5 %
MPV: 9.6 fL (ref 7.5–12.5)
NEUTROS PCT: 51 %
Neutro Abs: 2652 cells/uL (ref 1500–7800)
PLATELETS: 338 10*3/uL (ref 140–400)
RBC: 4.21 10*6/uL (ref 3.80–5.10)
RDW: 11.9 % (ref 11.0–15.0)
TOTAL LYMPHOCYTE: 41.3 %
WBC: 5.2 10*3/uL (ref 3.8–10.8)
WBCMIX: 338 {cells}/uL (ref 200–950)

## 2017-11-16 LAB — CK: CK TOTAL: 40 U/L (ref 29–143)

## 2017-11-16 LAB — TSH: TSH: 0.7 mIU/L (ref 0.40–4.50)

## 2017-11-16 LAB — LIPASE: Lipase: 31 U/L (ref 7–60)

## 2017-11-17 ENCOUNTER — Encounter: Payer: Self-pay | Admitting: Family Medicine

## 2017-11-29 ENCOUNTER — Ambulatory Visit: Payer: Self-pay | Admitting: Family Medicine

## 2017-12-14 ENCOUNTER — Other Ambulatory Visit: Payer: Self-pay | Admitting: Family Medicine

## 2017-12-14 NOTE — Telephone Encounter (Signed)
Seen 1 21 19

## 2017-12-21 ENCOUNTER — Other Ambulatory Visit: Payer: Self-pay | Admitting: Family Medicine

## 2017-12-21 DIAGNOSIS — Z1231 Encounter for screening mammogram for malignant neoplasm of breast: Secondary | ICD-10-CM

## 2017-12-27 ENCOUNTER — Ambulatory Visit (HOSPITAL_COMMUNITY): Payer: Self-pay

## 2018-01-03 ENCOUNTER — Encounter: Payer: Self-pay | Admitting: Family Medicine

## 2018-01-07 ENCOUNTER — Telehealth: Payer: Self-pay | Admitting: Family Medicine

## 2018-01-07 NOTE — Telephone Encounter (Signed)
Just had labs done in January 2019.  They are not due yet

## 2018-01-07 NOTE — Telephone Encounter (Signed)
Pt is calling in for lab work, I dont see anything on file, she states she has it done every 6 months.

## 2018-01-07 NOTE — Telephone Encounter (Signed)
What labs should I order?

## 2018-01-17 ENCOUNTER — Ambulatory Visit (HOSPITAL_COMMUNITY)
Admission: RE | Admit: 2018-01-17 | Discharge: 2018-01-17 | Disposition: A | Discharge status: Home / Self Care | Payer: Medicare Other | Source: Ambulatory Visit | Attending: Family Medicine | Admitting: Family Medicine

## 2018-01-17 ENCOUNTER — Encounter (HOSPITAL_COMMUNITY): Payer: Self-pay

## 2018-01-17 DIAGNOSIS — Z1231 Encounter for screening mammogram for malignant neoplasm of breast: Secondary | ICD-10-CM

## 2018-01-21 ENCOUNTER — Telehealth: Payer: Self-pay | Admitting: Family Medicine

## 2018-01-21 NOTE — Telephone Encounter (Signed)
Patient wants to know if she needs her labs done before her next office visit due to Lipitor rx.

## 2018-01-22 ENCOUNTER — Other Ambulatory Visit: Payer: Self-pay | Admitting: Family Medicine

## 2018-01-24 NOTE — Telephone Encounter (Signed)
She needs her lipids checked once a year on the lipitor.  Last done in July.

## 2018-01-24 NOTE — Telephone Encounter (Signed)
 Do you want her to have her lipids checked? Please advise.  Thank you.   S.

## 2018-01-24 NOTE — Telephone Encounter (Signed)
Spoke with patient and advised her she only needs lipids checked once a year even if on Lipitor per Dr.Nelson with verbal understanding.

## 2018-02-14 ENCOUNTER — Ambulatory Visit: Payer: Self-pay | Admitting: Family Medicine

## 2018-04-22 ENCOUNTER — Other Ambulatory Visit (HOSPITAL_COMMUNITY): Payer: Self-pay | Admitting: Neurology

## 2018-04-22 DIAGNOSIS — M546 Pain in thoracic spine: Secondary | ICD-10-CM

## 2018-05-09 ENCOUNTER — Ambulatory Visit (HOSPITAL_COMMUNITY): Payer: Medicare Other

## 2018-05-11 ENCOUNTER — Ambulatory Visit: Payer: Self-pay | Admitting: Cardiovascular Disease

## 2018-05-12 ENCOUNTER — Encounter: Payer: Self-pay | Admitting: *Deleted

## 2018-05-13 ENCOUNTER — Ambulatory Visit (HOSPITAL_COMMUNITY)
Admission: RE | Admit: 2018-05-13 | Discharge: 2018-05-13 | Disposition: A | Discharge status: Home / Self Care | Payer: Medicare Other | Source: Ambulatory Visit | Attending: Neurology | Admitting: Neurology

## 2018-05-13 ENCOUNTER — Ambulatory Visit (INDEPENDENT_AMBULATORY_CARE_PROVIDER_SITE_OTHER): Payer: Medicare Other | Admitting: Cardiovascular Disease

## 2018-05-13 ENCOUNTER — Encounter: Payer: Self-pay | Admitting: Cardiovascular Disease

## 2018-05-13 VITALS — BP 118/72 | HR 65 | Ht 63.0 in | Wt 135.0 lb

## 2018-05-13 DIAGNOSIS — R002 Palpitations: Secondary | ICD-10-CM | POA: Diagnosis not present

## 2018-05-13 DIAGNOSIS — M519 Unspecified thoracic, thoracolumbar and lumbosacral intervertebral disc disorder: Secondary | ICD-10-CM | POA: Diagnosis not present

## 2018-05-13 DIAGNOSIS — I351 Nonrheumatic aortic (valve) insufficiency: Secondary | ICD-10-CM

## 2018-05-13 DIAGNOSIS — M546 Pain in thoracic spine: Secondary | ICD-10-CM

## 2018-05-13 DIAGNOSIS — I472 Ventricular tachycardia: Secondary | ICD-10-CM

## 2018-05-13 DIAGNOSIS — I4729 Other ventricular tachycardia: Secondary | ICD-10-CM

## 2018-05-13 NOTE — Patient Instructions (Addendum)
Medication Instructions:  Continue all current medications.  Labwork: none  Testing/Procedures:  Your physician has requested that you have an echocardiogram. Echocardiography is a painless test that uses sound waves to create images of your heart. It provides your doctor with information about the size and shape of your heart and how well your heart's chambers and valves are working. This procedure takes approximately one hour. There are no restrictions for this procedure.  Your physician has recommended that you wear a 14 day event monitor. Event monitors are medical devices that record the heart's electrical activity. Doctors most often [place] these monitors to diagnose arrhythmias. Arrhythmias are problems with the speed or rhythm of the heartbeat. The monitor is a small, portable device. You can wear one while you do your normal daily activities. This is usually used to diagnose what is causing palpitations/syncope (passing out).  Office will contact with results via phone or letter.    Follow-Up: Next available.    Any Other Special Instructions Will Be Listed Below (If Applicable).  If you need a refill on your cardiac medications before your next appointment, please call your pharmacy.

## 2018-05-13 NOTE — Progress Notes (Signed)
CARDIOLOGY CONSULT NOTE  Patient ID: ANNE BOLTZ MRN: [id number] DOB/AGE: August 12, 1956 62 y.o.  Admit date: (Not on file) Primary Physician: Sandi Mealy, MD Referring Physician: Sandi Mealy, MD  Reason for Consultation: Palpitations  HPI: Destiny Young is a 62 y.o. female who is being seen today for the evaluation of palpitations at the request of Sandi Mealy, MD.   She underwent a normal nuclear stress test on 09/06/2013, LVEF 68%.  She had an echocardiogram done at her PCPs office dated 08/14/2013 which demonstrated normal left ventricular systolic function, EF 60 to 65%, with mild aortic regurgitation.  She has a long history of chest pain and GERD.  She underwent Nissen fundoplication in 6270 with a redo on 08/04/2017 and it was loosened.  She has several medication intolerances.  I reviewed notes from her PCP.  She also has a history of pseudoseizures, asthma, and hyperlipidemia.  Prior event monitoring demonstrated a 7 beat run of nonsustained ventricular tachycardia with a heart rate of 180 bpm.  I reviewed recent labs dated 04/18/2018: Normal free T4 1.19, low TSH 0.383,  I personally reviewed ECG performed on 11/15/2017 which demonstrated normal sinus rhythm with no ischemic ST segment or T wave abnormalities, nor any arrhythmias.  Upon speaking with her today, she describes bilateral leg weakness.  She feels weakness of her legs and to begin to shake when she is showering with her arms above her head.  She also describes palpitations when she lies down on her left side which occur about 3 hours into her sleep and have awakened her at times.  She also describes numbness of her shoulders and right sided neck pains as well as headaches.  She has left-sided chest pains and says they are deep.  She denies chest wall tenderness.  She said she stays at home most of the time.  She watches her 69-month-old granddaughter 2 days/week.  She weighs 26  pounds.  She also describes significant sweating without activity.  She told me her TSH is to be rechecked on 06/04/2018 by her PCP.   Family history: 2 sisters have "enlarged heart".  One apparently developed a postpartum cardiomyopathy.  Allergies  Allergen Reactions  . Ciprofloxacin Other (See Comments)    "Skin burning"- feeling, not allergy  . Codeine Nausea Only    Nausea and sweats  . Meperidine Hcl Nausea And Vomiting    GI intolerance  . Metaxalone Nausea Only    Stomach Irritation   . Naproxen Nausea Only    Stomach Irritation   . Neurontin [Gabapentin] Other (See Comments)    Blurred vision, funny feeling in head- side effect not allergy  . Simvastatin Other (See Comments)    Muscle aches in legs, fatigue- not allergic  . Sulfa Antibiotics Itching    Skin Irritation (Burning, Stinging)   . Tizanidine Other (See Comments)    Abdominal/chest pain/ "too relaxed"  . Trazodone And Nefazodone Other (See Comments)    Pt stated, "felt like had a little seizure; eyes started jumping; felt weirdness in top of head; sweating and felt sick - clammy and dizziness"    Current Outpatient Medications  Medication Sig Dispense Refill  . acetaminophen (TYLENOL) 500 MG tablet Take 500 mg by mouth every 6 (six) hours as needed for pain.    Marland Kitchen aspirin 81 MG tablet Take 81 mg by mouth daily.    Marland Kitchen atorvastatin (LIPITOR) 10 MG tablet TAKE ONE TABLET BY MOUTH  ONCE DAILY 90 tablet 0  . calcium carbonate (TUMS - DOSED IN MG ELEMENTAL CALCIUM) 500 MG chewable tablet Chew 1-2 tablets by mouth daily as needed for indigestion or heartburn.    . Calcium Citrate (CITRACAL PO) Take 1 tablet by mouth 2 (two) times daily.     . diphenhydrAMINE (BENADRYL) 25 MG tablet Take 25 mg by mouth daily as needed for allergies.    . DULoxetine (CYMBALTA) 30 MG capsule Take 30 mg by mouth daily.    . Magnesium Chloride (MAGNESIUM DR PO) Take 1 tablet by mouth daily.    . Multiple Vitamin (MULTIVITAMIN WITH  MINERALS) TABS Take 1 tablet by mouth daily.    . Omega-3 Fatty Acids (FISH OIL) 1000 MG CAPS Take 1 capsule by mouth daily.     Marland Kitchen omeprazole (PRILOSEC) 20 MG capsule Take 1 capsule (20 mg total) by mouth daily. 90 capsule 3  . Probiotic Product (PROBIOTIC PO) Take by mouth daily.    Marland Kitchen PROVENTIL HFA 108 (90 Base) MCG/ACT inhaler INHALE TWO PUFFS BY MOUTH EVERY 6 HOURS AS NEEDED FOR SHORTNESS OF BREATH 18 g 1  . terbinafine (LAMISIL) 250 MG tablet Take 1 tablet (250 mg total) by mouth daily. 90 tablet 0   Current Facility-Administered Medications  Medication Dose Route Frequency Provider Last Rate Last Dose  . betamethasone acetate-betamethasone sodium phosphate (CELESTONE) injection 3 mg  3 mg Intramuscular Once Edrick Kins, DPM        Past Medical History:  Diagnosis Date  . Allergy   . Anxiety   . Aortic insufficiency   . Arthritis    bulging discs  . Asthma   . Bradycardia   . Burning chest pain   . Cancer (H[facility])    abnormal PAP  . Depression   . Ejection fraction   . GERD (gastroesophageal reflux disease)    History of Nissen fundoplication  . Hemorrhoids   . High cholesterol   . History of Nissen fundoplication    9562  . IBS (irritable bowel syndrome)   . Pelvic pain in female 12/05/2015  . Pelvic relaxation 12/05/2015  . Rectocele 12/05/2015  . Seizures ([place])    per Dr Merlene Laughter  . Skin tag 12/05/2015  . TMJ (dislocation of temporomandibular joint)   . Ventricular tachycardia ([place]) 08/21/2013   7 beats ventricular tachycardia, rate 180, 8 day event recorder, July 13, 2013  //   nuclear stress study normal, November, 2014, normal EF by 2-D and nuclear     Past Surgical History:  Procedure Laterality Date  . ABDOMINAL HYSTERECTOMY    . ABDOMINAL SURGERY     hernia repair  . CERVICAL ABLATION    . COLONOSCOPY  May 2011   Dr. Gala Romney: Normal terminal ileum to 10 cm, normal colonoscopy.  . ESOPHAGOGASTRODUODENOSCOPY  04/14/2006   [id number] esophagus with  esophagogastric junction at 36 cm from the incisors.  Intact Nissen fundoplication, otherwise normal  . ESOPHAGOGASTRODUODENOSCOPY  May 2011   Dr. Gala Romney: normal, status post intact Nissen Fundoplication, antral erosions and erythema with chronic gastritis but no H. pylori on biopsies.  . ESOPHAGOGASTRODUODENOSCOPY N/A 12/03/2014   Dr.Rourk- intact fundoplication, antral erosions s/p bx. Bx= focally eroded reactive gastopathy negative for hpylori  . EXTERNAL EAR SURGERY    . HERNIA REPAIR    . NISSEN FUNDOPLICATION  1324  . NOSE SURGERY    . SHOULDER SURGERY    . TONSILLECTOMY      Social History   Socioeconomic History  .  Marital status: Widowed    Spouse name: Annie Main  . Number of children: 3  . Years of education: 9  . Highest education level: Not on file  Occupational History  . Occupation: cleans houses  Social Needs  . Financial resource strain: Not on file  . Food insecurity:    Worry: Not on file    Inability: Not on file  . Transportation needs:    Medical: Not on file    Non-medical: Not on file  Tobacco Use  . Smoking status: Never Smoker  . Smokeless tobacco: Never Used  Substance and Sexual Activity  . Alcohol use: Yes    Comment: occ glass of red wine, 01-06-2017 RARELY  . Drug use: Yes    Types: Marijuana    Comment: occ. 01-06-2017 PER PT [facility]Y [facility]IN IN [facility]  . Sexual activity: Not Currently    Birth control/protection: Surgical    Comment: hyst  Lifestyle  . Physical activity:    Days per week: Not on file    Minutes per session: Not on file  . Stress: Not on file  Relationships  . Social connections:    Talks on phone: Not on file    Gets together: Not on file    Attends religious service: Not on file    Active member of club or organization: Not on file    Attends meetings of clubs or organizations: Not on file    Relationship status: Not on file  . Intimate partner violence:    Fear of current or ex partner: Not on file    Emotionally  abused: Not on file    Physically abused: Not on file    Forced sexual activity: Not on file  Other Topics Concern  . Not on file  Social History Narrative   Lives independently   Daughter and granddaughter live with her      Disabled- for stomach problems and orthopedic problems, nerves      Current Meds  Medication Sig  . acetaminophen (TYLENOL) 500 MG tablet Take 500 mg by mouth every 6 (six) hours as needed for pain.  Marland Kitchen aspirin 81 MG tablet Take 81 mg by mouth daily.  Marland Kitchen atorvastatin (LIPITOR) 10 MG tablet TAKE ONE TABLET BY MOUTH ONCE DAILY  . calcium carbonate (TUMS - DOSED IN MG ELEMENTAL CALCIUM) 500 MG chewable tablet Chew 1-2 tablets by mouth daily as needed for indigestion or heartburn.  . Calcium Citrate (CITRACAL PO) Take 1 tablet by mouth 2 (two) times daily.   . diphenhydrAMINE (BENADRYL) 25 MG tablet Take 25 mg by mouth daily as needed for allergies.  . DULoxetine (CYMBALTA) 30 MG capsule Take 30 mg by mouth daily.  . Magnesium Chloride (MAGNESIUM DR PO) Take 1 tablet by mouth daily.  . Multiple Vitamin (MULTIVITAMIN WITH MINERALS) TABS Take 1 tablet by mouth daily.  . Omega-3 Fatty Acids (FISH OIL) 1000 MG CAPS Take 1 capsule by mouth daily.   Marland Kitchen omeprazole (PRILOSEC) 20 MG capsule Take 1 capsule (20 mg total) by mouth daily.  . Probiotic Product (PROBIOTIC PO) Take by mouth daily.  Marland Kitchen PROVENTIL HFA 108 (90 Base) MCG/ACT inhaler INHALE TWO PUFFS BY MOUTH EVERY 6 HOURS AS NEEDED FOR SHORTNESS OF BREATH  . terbinafine (LAMISIL) 250 MG tablet Take 1 tablet (250 mg total) by mouth daily.   Current Facility-Administered Medications for the 05/13/18 encounter (Office Visit) with Herminio Commons, MD  Medication  . betamethasone acetate-betamethasone sodium phosphate (CELESTONE) injection  3 mg      Review of systems complete and found to be negative unless listed above in HPI    Physical exam Height 5\' 3"  (1.6 m), weight 135 lb (61.2 kg). General: NAD Neck: No  JVD, no thyromegaly or thyroid nodule.  Lungs: Clear to auscultation bilaterally with normal respiratory effort. CV: Nondisplaced PMI. Regular rate and rhythm, normal S1/S2, no S3/S4, no murmur.  No peripheral edema.  No carotid bruit.   Abdomen: Soft, nontender, no distention.  Skin: Intact without lesions or rashes.  Neurologic: Alert and oriented x 3.  Psych: Normal affect. Extremities: No clubbing or cyanosis.  HEENT: Normal.   ECG: Most recent ECG reviewed.   Labs: Lab Results  Component Value Date/Time   K 4.3 11/15/2017 02:03 PM   BUN 10 11/15/2017 02:03 PM   CREATININE 0.83 11/15/2017 02:03 PM   ALT 14 11/15/2017 02:03 PM   TSH 0.70 11/15/2017 02:03 PM   HGB 12.6 11/15/2017 02:03 PM     Lipids: Lab Results  Component Value Date/Time   LDLCALC 107 (H) 05/17/2017 02:04 PM   CHOL 168 05/17/2017 02:04 PM   TRIG 64 05/17/2017 02:04 PM   HDL 48 (L) 05/17/2017 02:04 PM        ASSESSMENT AND PLAN:  1.  Palpitations with history of nonsustained ventricular tachycardia: She does have a prior history of nonsustained ventricular tachycardia seen with event monitoring.  I will obtain an echocardiogram to evaluate cardiac structure and function.  I will also obtain a 2-week event monitor.  Palpitations may be triggered by slightly overactive thyroid as denoted by low TSH.  2.  Aortic regurgitation: This was mild when last assessed. I will order a 2-D echocardiogram with Doppler to evaluate cardiac structure, function, and regional wall motion.    Disposition: Follow up in 2 to 3 months   Signed: Kate Sable, M.D., F.A.C.C.  05/13/2018, 10:47 AM

## 2018-05-25 ENCOUNTER — Other Ambulatory Visit: Payer: Self-pay | Admitting: Cardiovascular Disease

## 2018-05-25 DIAGNOSIS — I359 Nonrheumatic aortic valve disorder, unspecified: Secondary | ICD-10-CM

## 2018-05-25 DIAGNOSIS — I351 Nonrheumatic aortic (valve) insufficiency: Secondary | ICD-10-CM

## 2018-05-26 ENCOUNTER — Ambulatory Visit (INDEPENDENT_AMBULATORY_CARE_PROVIDER_SITE_OTHER): Payer: Medicare Other

## 2018-05-26 DIAGNOSIS — R002 Palpitations: Secondary | ICD-10-CM | POA: Diagnosis not present

## 2018-06-08 ENCOUNTER — Other Ambulatory Visit: Payer: Self-pay

## 2018-06-08 ENCOUNTER — Ambulatory Visit (INDEPENDENT_AMBULATORY_CARE_PROVIDER_SITE_OTHER): Payer: Medicare Other

## 2018-06-08 DIAGNOSIS — I351 Nonrheumatic aortic (valve) insufficiency: Secondary | ICD-10-CM

## 2018-06-08 DIAGNOSIS — I359 Nonrheumatic aortic valve disorder, unspecified: Secondary | ICD-10-CM

## 2018-06-09 ENCOUNTER — Telehealth: Payer: Self-pay | Admitting: *Deleted

## 2018-06-09 NOTE — Telephone Encounter (Signed)
Notes recorded by Laurine Blazer, LPN on 0/22/8406 at 9:86 PM EDT Patient notified. Copy to pmd. Follow up scheduled for October with Dr. Bronson Ing in [place].   ------  Notes recorded by Herminio Commons, MD on 06/09/2018 at 11:16 AM EDT Normal pumping function with aortic valve leakage. Fairly stable overall.

## 2018-06-10 ENCOUNTER — Telehealth: Payer: Self-pay | Admitting: Cardiovascular Disease

## 2018-06-10 NOTE — Telephone Encounter (Signed)
End of service not available for patient at this time. Patient has not mailed monitor back to [employer].

## 2018-06-10 NOTE — Telephone Encounter (Signed)
Dr. Harrington Challenger is needing pt's holter loaded into the system so she can read it.

## 2018-06-24 ENCOUNTER — Telehealth: Payer: Self-pay | Admitting: *Deleted

## 2018-06-24 NOTE — Telephone Encounter (Signed)
Notes recorded by Laurine Blazer, LPN on 04/14/3558 at 7:41 PM EDT Patient notified. Copy to pmd. Follow up scheduled for October.   ------  Notes recorded by Herminio Commons, MD on 06/23/2018 at 10:59 PM EDT No arrhythmias.

## 2018-08-02 ENCOUNTER — Ambulatory Visit: Payer: Self-pay | Admitting: Cardiovascular Disease

## 2018-08-05 ENCOUNTER — Ambulatory Visit: Payer: Self-pay | Admitting: Cardiovascular Disease

## 2018-12-22 ENCOUNTER — Other Ambulatory Visit (HOSPITAL_COMMUNITY): Payer: Self-pay | Admitting: Family Medicine

## 2018-12-22 DIAGNOSIS — Z1231 Encounter for screening mammogram for malignant neoplasm of breast: Secondary | ICD-10-CM

## 2019-01-23 ENCOUNTER — Ambulatory Visit (HOSPITAL_COMMUNITY): Payer: Self-pay

## 2019-03-13 ENCOUNTER — Ambulatory Visit (HOSPITAL_COMMUNITY): Admission: RE | Admit: 2019-03-13 | Payer: Medicare Other | Source: Ambulatory Visit

## 2019-07-31 ENCOUNTER — Telehealth: Payer: Self-pay | Admitting: *Deleted

## 2019-07-31 ENCOUNTER — Encounter: Payer: Self-pay | Admitting: Neurology

## 2019-07-31 ENCOUNTER — Ambulatory Visit: Payer: Medicare Other | Admitting: Neurology

## 2019-07-31 NOTE — Telephone Encounter (Signed)
Patient called, same day, to cancel her new patient appointment.  Reports she is not feeling well.

## 2019-09-14 ENCOUNTER — Ambulatory Visit: Payer: Medicare Other | Admitting: Neurology

## 2019-09-20 ENCOUNTER — Ambulatory Visit: Payer: Medicare Other | Admitting: Urology

## 2019-11-06 ENCOUNTER — Ambulatory Visit: Payer: Medicare Other | Admitting: Neurology

## 2019-12-15 ENCOUNTER — Telehealth: Payer: Self-pay | Admitting: Adult Health

## 2019-12-15 NOTE — Telephone Encounter (Signed)

## 2019-12-18 ENCOUNTER — Encounter: Payer: Medicare Other | Admitting: Adult Health

## 2019-12-28 ENCOUNTER — Telehealth: Payer: Self-pay | Admitting: Adult Health

## 2019-12-28 NOTE — Telephone Encounter (Signed)

## 2020-01-01 ENCOUNTER — Encounter: Payer: Medicare HMO | Admitting: Adult Health

## 2020-01-05 ENCOUNTER — Telehealth: Payer: Self-pay | Admitting: Adult Health

## 2020-01-05 NOTE — Telephone Encounter (Signed)

## 2020-01-08 ENCOUNTER — Encounter: Payer: Self-pay | Admitting: Adult Health

## 2020-01-08 ENCOUNTER — Ambulatory Visit (INDEPENDENT_AMBULATORY_CARE_PROVIDER_SITE_OTHER): Payer: Medicare HMO | Admitting: Adult Health

## 2020-01-08 ENCOUNTER — Other Ambulatory Visit: Payer: Self-pay

## 2020-01-08 VITALS — BP 135/64 | HR 78 | Ht 62.5 in | Wt 144.0 lb

## 2020-01-08 DIAGNOSIS — Z1212 Encounter for screening for malignant neoplasm of rectum: Secondary | ICD-10-CM

## 2020-01-08 DIAGNOSIS — R102 Pelvic and perineal pain: Secondary | ICD-10-CM

## 2020-01-08 DIAGNOSIS — Z1211 Encounter for screening for malignant neoplasm of colon: Secondary | ICD-10-CM | POA: Diagnosis not present

## 2020-01-08 DIAGNOSIS — N8111 Cystocele, midline: Secondary | ICD-10-CM | POA: Insufficient documentation

## 2020-01-08 DIAGNOSIS — N952 Postmenopausal atrophic vaginitis: Secondary | ICD-10-CM

## 2020-01-08 DIAGNOSIS — L987 Excessive and redundant skin and subcutaneous tissue: Secondary | ICD-10-CM

## 2020-01-08 LAB — HEMOCCULT GUIAC POC 1CARD (OFFICE): Fecal Occult Blood, POC: NEGATIVE

## 2020-01-08 MED ORDER — PREMARIN 0.625 MG/GM VA CREA: TOPICAL_CREAM | VAGINAL | 1 refills | Status: DC

## 2020-01-08 NOTE — Progress Notes (Signed)
Patient ID: ARTHER SARNO, female   DOB: 08/30/1956, 64 y.o.   MRN: [id number] History of Present Illness: Robinn is a 64 year old white female, widowed, sp hysterectomy in complaining of pelvic pain and pain with sitting due to extra skin between vagina and rectum and can't seem to be able to have sex.It has been 5 years since having sex, and has new friend.  Has BM twice a week.  She had hysterectomy in 1998 by Dr Glo Herring and had some tightening up then.  PCP is Dr Huel Cote.   Current Medications, Allergies, Past Medical History, Past Surgical History, Family History and Social History were reviewed in [employer] record.     Review of Systems: +pelvic pain almost daily Has pain with sitting due to skin between vagina and rectum Can't seem to have sex, it won't go in BM twice a week  See HPI for other positives.   Physical Exam:BP 135/64 (BP Location: Left Arm, Patient Position: Sitting, Cuff Size: Normal)   Pulse 78   Ht 5' 2.5" (1.588 m)   Wt 144 lb (65.3 kg)   BMI 25.92 kg/m  General:  Well developed, well nourished, no acute distress Skin:  Warm and dry Lungs; Clear to auscultation bilaterally Cardiovascular: Regular rate and rhythm Pelvic:  External genitalia is normal in appearance, no lesions.  The vagina is pale,lacks moisture and loss of rugae,there is a 1 cm firm oval nodule posterior vagina just about 1 inch from introitus. Urethra has no lesions or masses. The cervix and uterus are absent and she has cystocele. No adnexal masses, + tenderness noted,low.Bladder is  tender, no masses felt. She has extra amount of skin between vagina and rectum, where may have had episiotomy repair.  Rectal: Good sphincter tone, no polyps, or hemorrhoids felt.  Hemoccult negative. Extremities/musculoskeletal:  No swelling or varicosities noted, no clubbing or cyanosis Psych:  No mood changes, alert and cooperative,seems happy Fall risk is low PHQ 2 score s  0. Examination chaperoned by Rolena Infante LPN.  Impression and Plan: 1. Pelvic pain Return for GYN [place] then see Dr Glo Herring 4/1 to discuss regarding other issues   2. 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 Excessive and redundant skin and subcutaneous tissue See Dr Glo Herring 4/1 about exesion of this skin   4. Vaginal atrophy Will Rx PVC Meds ordered this encounter  Medications  . conjugated estrogens (PREMARIN) vaginal cream    Sig: Use 0.5 gm in vagina at bedtime for 2 weeks then twice a week    Dispense:  42.5 g    Refill:  1    Order Specific Question:   Supervising Provider    Answer:   Florian Buff [[id number]]  Wait to have sex for few weeks to give PVC time to work Discussed  vagina after menopause and no use,  and that of 64 year old penis  5. Screening for colorectal cancer

## 2020-01-17 ENCOUNTER — Other Ambulatory Visit: Payer: Self-pay | Admitting: Adult Health

## 2020-01-17 ENCOUNTER — Telehealth: Payer: Self-pay | Admitting: Obstetrics & Gynecology

## 2020-01-17 DIAGNOSIS — R102 Pelvic and perineal pain: Secondary | ICD-10-CM

## 2020-01-17 NOTE — Telephone Encounter (Signed)

## 2020-01-18 ENCOUNTER — Ambulatory Visit (INDEPENDENT_AMBULATORY_CARE_PROVIDER_SITE_OTHER): Payer: Medicare HMO

## 2020-01-18 ENCOUNTER — Other Ambulatory Visit: Payer: Self-pay

## 2020-01-18 DIAGNOSIS — R102 Pelvic and perineal pain: Secondary | ICD-10-CM

## 2020-01-18 NOTE — Progress Notes (Signed)
PELVIC US TA/TV: normal vaginal cuff,ovaries not visualized,bilat adnexa's WNL,no free fluid,pelvic discomfort during ultrasound  Chaperone Estill Bamberg

## 2020-01-19 ENCOUNTER — Other Ambulatory Visit: Payer: Medicare HMO

## 2020-01-22 ENCOUNTER — Telehealth: Payer: Self-pay | Admitting: Adult Health

## 2020-01-22 NOTE — Telephone Encounter (Signed)
Pt aware of [place] keep appt with Dr Glo Herring on 01/25/20

## 2020-01-25 ENCOUNTER — Ambulatory Visit: Payer: Medicare HMO | Admitting: Obstetrics and Gynecology

## 2020-01-31 ENCOUNTER — Telehealth: Payer: Self-pay | Admitting: Adult Health

## 2020-01-31 NOTE — Telephone Encounter (Signed)

## 2020-02-01 ENCOUNTER — Ambulatory Visit (INDEPENDENT_AMBULATORY_CARE_PROVIDER_SITE_OTHER): Payer: Medicare HMO | Admitting: Obstetrics and Gynecology

## 2020-02-01 ENCOUNTER — Other Ambulatory Visit: Payer: Self-pay

## 2020-02-01 ENCOUNTER — Encounter: Payer: Self-pay | Admitting: Obstetrics and Gynecology

## 2020-02-01 VITALS — BP 112/59 | HR 72 | Ht 62.5 in | Wt 144.0 lb

## 2020-02-01 DIAGNOSIS — N8189 Other female genital prolapse: Secondary | ICD-10-CM | POA: Diagnosis not present

## 2020-02-01 DIAGNOSIS — N8111 Cystocele, midline: Secondary | ICD-10-CM | POA: Diagnosis not present

## 2020-02-01 NOTE — Progress Notes (Signed)
Patient ID: LENNI HNAT, female   DOB: [birth year], 64 y.o.   MRN: [id number]    [facility] Visit  02/01/20        Patient name: Destiny Young MRN [id number]  Date of birth: 1956-07-21  CC & HPI:  Destiny Young is a 64 y.o. female presenting today for follow up to TV/TA pelvic ultrasound.   History of Present Illness: Destiny Young is a 64 year old white female, widowed, sp hysterectomy in 1998 complaining of pelvic pain and pain with sitting due to extra skin just anterior to anus and can't seem to be able to have sex She experiences discomfort with penetration halfway up the posterior vagina additionall she senses pressure on the perianal tissues just anterior to anus with sitting, and it is uncomfortable..It has been 5 years since having sex, since being widowed, and has new friend and partner. Has BM twice a week. She considers this normal for her.   She had vaginal hysterectomy with BSO in 1998 by Dr Glo Herring and had posterior repair then.  PCP is Dr Huel Cote.  TV/TA pelvic ultrasound on 01/18/2020 revealed normal vaginal cuff, ovaries not visualized, bilat adnexa's WNL, no free fluid, pelvic discomfort during ultrasound.  She notes that her pain is in her lower pelvis. She does not notice any changes with bowel movements. Her hysterectomy was completed vaginally in 1998.   She notes that she does Kegels      ROS:  Review of Systems  Constitutional: Negative for diaphoresis, fever, malaise/fatigue and weight loss.  HENT: Negative for congestion and sore throat.   Eyes: Negative for blurred vision and double vision.  Respiratory: Negative for cough and shortness of breath.   Cardiovascular: Negative for chest pain, palpitations and leg swelling.  Gastrointestinal: Positive for constipation (intermittent). Negative for diarrhea, nausea and vomiting.  Genitourinary: Positive for urgency. Negative for frequency.  Musculoskeletal: Positive for back pain. Negative for falls  and myalgias.  Skin: Negative for rash.  Neurological: Negative for dizziness, weakness and headaches.  Psychiatric/Behavioral: Negative for depression. The patient is not nervous/anxious.   GI: bm's q 2 days,    Pertinent History Reviewed:  Reviewed: Significant for generalized abdominal wall laxities. Had [employer] fundoplication in [id number], had ventral hernia, had vaginal hysterectomy, bso, with posterior repair after failed pessary use in 98. Medical         Past Medical History:  Diagnosis Date  . Allergy   . Anxiety   . Aortic insufficiency   . Arthritis    bulging discs  . Asthma   . Bradycardia   . Burning chest pain   . Cancer (HCC)    abnormal PAP  . Depression   . Ejection fraction   . GERD (gastroesophageal reflux disease)    History of Nissen fundoplication  . Hemorrhoids   . High cholesterol   . History of Nissen fundoplication    [id number]  . IBS (irritable bowel syndrome)   . Pelvic pain in female 12/05/2015  . Pelvic relaxation 12/05/2015  . Rectocele 12/05/2015  . Seizures ([place])    per Dr Merlene Laughter  . Skin tag 12/05/2015  . TMJ (dislocation of temporomandibular joint)   . Ventricular tachycardia ([place]) 08/21/2013   7 beats ventricular tachycardia, rate 180, 8 day event recorder, July 13, 2013  //   nuclear stress study normal, November, 2014, normal EF by 2-D and nuclear  Surgical Hx:    Past Surgical History:  Procedure Laterality Date  . ABDOMINAL HYSTERECTOMY    . ABDOMINAL SURGERY     hernia repair  . CERVICAL ABLATION    . CHOLECYSTECTOMY    . COLONOSCOPY  May 2011   Dr. Gala Romney: Normal terminal ileum to 10 cm, normal colonoscopy.  . ESOPHAGOGASTRODUODENOSCOPY  04/14/2006   [id number] esophagus with esophagogastric junction at 36 cm from the incisors.  Intact Nissen fundoplication, otherwise normal  . ESOPHAGOGASTRODUODENOSCOPY  May 2011   Dr. Gala Romney: normal, status post intact Nissen Fundoplication, antral erosions and  erythema with chronic gastritis but no H. pylori on biopsies.  . ESOPHAGOGASTRODUODENOSCOPY N/A 12/03/2014   Dr.Rourk- intact fundoplication, antral erosions s/p bx. Bx= focally eroded reactive gastopathy negative for hpylori  . EXTERNAL EAR SURGERY    . HERNIA REPAIR    . NISSEN FUNDOPLICATION  [id number]  . NOSE SURGERY    . SHOULDER SURGERY    . TONSILLECTOMY     Medications: Reviewed & Updated - see associated section                       Current Outpatient Medications:  .  acetaminophen (TYLENOL) 500 MG tablet, Take 500 mg by mouth every 6 (six) hours as needed for pain., Disp: , Rfl:  .  Ascorbic Acid (VITAMIN C) 100 MG tablet, Take 100 mg by mouth daily., Disp: , Rfl:  .  aspirin 81 MG tablet, Take 81 mg by mouth daily., Disp: , Rfl:  .  atorvastatin (LIPITOR) 10 MG tablet, TAKE ONE TABLET BY MOUTH ONCE DAILY, Disp: 90 tablet, Rfl: 0 .  Calcium Citrate (CITRACAL PO), Take 1 tablet by mouth 2 (two) times daily. , Disp: , Rfl:  .  clonazePAM (KLONOPIN) 1 MG tablet, Take 1 mg by mouth 2 (two) times daily., Disp: , Rfl:  .  conjugated estrogens (PREMARIN) vaginal cream, Use 0.5 gm in vagina at bedtime for 2 weeks then twice a week, Disp: 42.5 g, Rfl: 1 .  diphenhydrAMINE (BENADRYL) 25 MG tablet, Take 25 mg by mouth daily as needed for allergies., Disp: , Rfl:  .  DULoxetine (CYMBALTA) 30 MG capsule, Take 30 mg by mouth daily., Disp: , Rfl:  .  Flaxseed, Linseed, (FLAX SEEDS PO), Take by mouth., Disp: , Rfl:  .  ibuprofen (ADVIL) 800 MG tablet, Take 800 mg by mouth every 8 (eight) hours as needed., Disp: , Rfl:  .  Magnesium Chloride (MAGNESIUM DR PO), Take 1 tablet by mouth daily., Disp: , Rfl:  .  Multiple Vitamin (MULTIVITAMIN WITH MINERALS) TABS, Take 1 tablet by mouth daily., Disp: , Rfl:  .  Probiotic Product (PROBIOTIC PO), Take by mouth daily., Disp: , Rfl:  .  PROVENTIL HFA 108 (90 Base) MCG/ACT inhaler, INHALE TWO PUFFS BY MOUTH EVERY 6 HOURS AS NEEDED FOR SHORTNESS OF BREATH, Disp:  18 g, Rfl: 1  Current Facility-Administered Medications:  .  betamethasone acetate-betamethasone sodium phosphate (CELESTONE) injection 3 mg, 3 mg, Intramuscular, Once, Evans, Dorathy Daft, DPM   Social History: Reviewed -  reports that she has never smoked. She has never used smokeless tobacco.  Objective Findings:  Vitals: There were no vitals taken for this visit.  PHYSICAL EXAMINATION General appearance - alert, well appearing, and in no distress, oriented to person, place, and time, normal appearing weight, well hydrated and anxious Mental status - alert, oriented to person, place, and time, normal mood, behavior, speech, dress, motor activity, and  thought processes, affect appropriate to mood, anxious Chest - not examined Heart - not examined Abdomen - not examined Breasts - not examined Skin - normal coloration and turgor, no rashes, no suspicious skin lesions noted  PELVIC External genitalia - perineal bulging  With external excess perianal tissue anteriorly. This is without irritation, but perceived as uncomfortable to touch and to pressure. Vulva - normal for age Vagina - vaginal apex is less sensitive than posterior wall Cervix - surgically absent} Uterus - surgically absent Adnexa - vaginal length 8 cm. [place] - n/a Rectal - external hemorrhoids non-thrombosed digital rectal, : recurrent rectocele, upper posterior vaginal repair has become lax again. Bimanual: on bimanual , she is uncomfortable with posterior pressure on levator plate halfway up vagina, and this reprduces the penile discomfort.    Assessment & Plan:   A:  1.  recurrent rectocele, with upper aspects of posterior repair seeming lax again. 2. Dyspareunia due to contact on levator muscles by partner.  Will seek consult with Dr Zigmund Daniel, [facility].  P:  1. Referral to Dr. Zigmund Daniel in general surgery for surgical evaluation     By signing my name below, I, Amber Handy, attest that this  documentation has been prepared under the direction and in the presence of Jonnie Kind, MD. Electronically Signed: [place]. 02/01/20. 1:37 PM.  .I personally performed the services described in this documentation, which was SCRIBED in my presence. The recorded information has been reviewed and considered accurate. It has been edited as necessary during review. Jonnie Kind, MD

## 2020-02-06 ENCOUNTER — Telehealth: Payer: Self-pay | Admitting: *Deleted

## 2020-02-06 ENCOUNTER — Telehealth: Payer: Self-pay | Admitting: Obstetrics and Gynecology

## 2020-02-06 NOTE — Telephone Encounter (Signed)
Called patient to let her know I was sending referral to Dr Zigmund Daniel and to get her availability before setting up appt.  Patient states she has decided she does not want to go through with surgery and does not want the referral sent.  However, she does want to have the skin removed from her anal area but was unsure who she needed to see for this.  Please advise.

## 2020-02-06 NOTE — Telephone Encounter (Signed)
Patient has been referred to [facility]. She has been there in the past.

## 2020-02-06 NOTE — Telephone Encounter (Signed)
I have called the patient and referred her to [facility]. She has seen them in the past.

## 2020-02-07 ENCOUNTER — Other Ambulatory Visit: Payer: Self-pay | Admitting: *Deleted

## 2020-02-07 DIAGNOSIS — L987 Excessive and redundant skin and subcutaneous tissue: Secondary | ICD-10-CM

## 2020-02-08 ENCOUNTER — Encounter: Payer: Self-pay | Admitting: Internal Medicine

## 2020-03-07 ENCOUNTER — Ambulatory Visit: Payer: Medicare HMO | Admitting: General Surgery

## 2020-06-14 ENCOUNTER — Ambulatory Visit: Payer: Medicare HMO | Admitting: Nurse Practitioner

## 2020-06-14 ENCOUNTER — Encounter: Payer: Self-pay | Admitting: Nurse Practitioner

## 2020-06-14 DIAGNOSIS — E041 Nontoxic single thyroid nodule: Secondary | ICD-10-CM | POA: Insufficient documentation

## 2020-06-14 DIAGNOSIS — E069 Thyroiditis, unspecified: Secondary | ICD-10-CM | POA: Insufficient documentation

## 2020-06-14 HISTORY — DX: Nontoxic single thyroid nodule: E04.1

## 2020-06-21 ENCOUNTER — Ambulatory Visit (INDEPENDENT_AMBULATORY_CARE_PROVIDER_SITE_OTHER): Payer: Medicare HMO | Admitting: Nurse Practitioner

## 2020-06-21 ENCOUNTER — Other Ambulatory Visit: Payer: Self-pay

## 2020-06-21 ENCOUNTER — Encounter: Payer: Self-pay | Admitting: Nurse Practitioner

## 2020-06-21 VITALS — BP 129/74 | HR 73 | Ht 62.0 in | Wt 143.6 lb

## 2020-06-21 DIAGNOSIS — E041 Nontoxic single thyroid nodule: Secondary | ICD-10-CM | POA: Diagnosis not present

## 2020-06-21 NOTE — Patient Instructions (Signed)
Thyroid Nodule  A thyroid nodule is an isolated growth of thyroid cells that forms a lump in your thyroid gland. The thyroid gland is a butterfly-shaped gland. It is found in the lower front of your neck. This gland sends chemical messengers (hormones) through your blood to all parts of your body. These hormones are important in regulating your body temperature and helping your body to use energy. Thyroid nodules are common. Most are not cancerous (benign). You may have one nodule or several nodules. Different types of thyroid nodules include nodules that:  Grow and fill with fluid (thyroid cysts).  Produce too much thyroid hormone (hot nodules or hyperthyroid).  Produce no thyroid hormone (cold nodules or hypothyroid).  Form from cancer cells (thyroid cancers). What are the causes? In most cases, the cause of this condition is not known. What increases the risk? The following factors may make you more likely to develop this condition.  Age. Thyroid nodules become more common in people who are older than 63 years of age.  Gender. ? Benign thyroid nodules are more common in women. ? Cancerous (malignant) thyroid nodules are more common in men.  A family history that includes: ? Thyroid nodules. ? Pheochromocytoma. ? Thyroid carcinoma. ? Hyperparathyroidism.  Certain kinds of thyroid diseases, such as Hashimoto's thyroiditis.  Lack of iodine in your diet.  A history of head and neck radiation, such as from previous cancer treatment. What are the signs or symptoms? In many cases, there are no symptoms. If you have symptoms, they may include:  A lump in your lower neck.  Feeling a lump or tickle in your throat.  Pain in your neck, jaw, or ear.  Having trouble swallowing. Hot nodules may cause symptoms that include:  Weight loss.  Warm, flushed skin.  Feeling hot.  Feeling nervous.  A racing heartbeat. Cold nodules may cause symptoms that include:  Weight  gain.  Dry skin.  Brittle hair. This may also occur with hair loss.  Feeling cold.  Fatigue. Thyroid cancer nodules may cause symptoms that include:  Hard nodules that feel stuck to the thyroid gland.  Hoarseness.  Lumps in the glands near your thyroid (lymph nodes). How is this diagnosed? A thyroid nodule may be felt by your health care provider during a physical exam. This condition may also be diagnosed based on your symptoms. You may also have tests, including:  An ultrasound. This may be done to confirm the diagnosis.  A biopsy. This involves taking a sample from the nodule and looking at it under a microscope.  Blood tests to make sure that your thyroid is working properly.  A thyroid scan. This test uses a radioactive tracer injected into a vein to create an image of the thyroid gland on a computer screen.  Imaging tests such as MRI or CT scan. These may be done if: ? Your nodule is large. ? Your nodule is blocking your airway. ? Cancer is suspected. How is this treated? Treatment depends on the cause and size of your nodule or nodules. If the nodule is benign, treatment may not be necessary. Your health care provider may monitor the nodule to see if it goes away without treatment. If the nodule continues to grow, is cancerous, or does not go away, treatment may be needed. Treatment may include:  Having a cystic nodule drained with a needle.  Ablation therapy. In this treatment, alcohol is injected into the area of the nodule to destroy the cells. Ablation with heat (  thermal ablation) may also be used.  Radioactive iodine. In this treatment, radioactive iodine is given as a pill or liquid that you drink. This substance causes the thyroid nodule to shrink.  Surgery to remove the nodule. Part or all of your thyroid gland may need to be removed as well.  Medicines. Follow these instructions at home:  Pay attention to any changes in your nodule.  Take  over-the-counter and prescription medicines only as told by your health care provider.  Keep all follow-up visits as told by your health care provider. This is important. Contact a health care provider if:  Your voice changes.  You have trouble swallowing.  You have pain in your neck, ear, or jaw that is getting worse.  Your nodule gets bigger.  Your nodule starts to make it harder for you to breathe.  Your muscles look like they are shrinking (muscle wasting). Get help right away if:  You have chest pain.  There is a loss of consciousness.  You have a sudden fever.  You feel confused.  You are seeing or hearing things that other people do not see or hear (having hallucinations).  You feel very weak.  You have mood swings.  You feel very restless.  You feel suddenly nauseous or throw up.  You suddenly have diarrhea. Summary  A thyroid nodule is an isolated growth of thyroid cells that forms a lump in your thyroid gland.  Thyroid nodules are common. Most are not cancerous (benign). You may have one nodule or several nodules.  Treatment depends on the cause and size of your nodule or nodules. If the nodule is benign, treatment may not be necessary.  Your health care provider may monitor the nodule to see if it goes away without treatment. If the nodule continues to grow, is cancerous, or does not go away, treatment may be needed. This information is not intended to replace advice given to you by your health care provider. Make sure you discuss any questions you have with your health care provider. Document Revised: 05/27/2018 Document Reviewed: 05/30/2018 Elsevier Patient Education  2020 [employer]  

## 2020-06-21 NOTE — Progress Notes (Signed)
06/21/20   Endocrinology Consult Note   Subjective:     Patient ID: Destiny Young, female   DOB: Aug 31, 1956, 64 y.o.   MRN: [id number] Patient presents today in consultation for thyroid work up as requested by Leeanne Rio, MD.  She complains of neck fullness, feeling hard knots in her throat, sore throat when swallowing with associated right ear pain, raspy voice, and waking with a bitter taste in her mouth.  She denies any family history of thyroid disease, except for her niece who has hypothyroidism.  She has never taken any medications for her thyroid, denies use of Biotin supplement.  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  She has a history of GERD requiring Nissen wrap and revision.  She had a previous thyroid ultrasound on 08/14/2019 due to similar complaints showing small nodule in left inferior thyroid lobe measuring 1.1 cm.  She has not had any further work up for her symptoms since then.    There are no recent thyroid labs to review.  Past Medical History:  Diagnosis Date   Allergy    Anxiety    Aortic insufficiency    Arthritis    bulging discs   Asthma    Bradycardia    Burning chest pain    Cancer (HCC)    abnormal PAP   Depression    Ejection fraction    GERD (gastroesophageal reflux disease)    History of Nissen fundoplication   Hemorrhoids    High cholesterol    History of Nissen fundoplication    8921   IBS (irritable bowel syndrome)    Nodular thyroid disease 06/14/2020   Pelvic pain in female 12/05/2015   Pelvic relaxation 12/05/2015   Rectocele 12/05/2015   Seizures ([place])    per Dr Merlene Laughter   Skin tag 12/05/2015   TMJ (dislocation of temporomandibular joint)    Ventricular tachycardia ([place]) 08/21/2013   7 beats ventricular tachycardia, rate 180, 8 day event recorder, July 13, 2013  //   nuclear stress study normal, November, 2014, normal EF by 2-D and nuclear    Allergies  Allergen Reactions   Demerol  [Meperidine]    Ciprofloxacin Other (See Comments)    "Skin burning"- feeling, not allergy   Codeine Nausea Only    Nausea and sweats   Meperidine Hcl Nausea And Vomiting    GI intolerance   Metaxalone Nausea Only    Stomach Irritation    Naproxen Nausea Only    Stomach Irritation    Neurontin [Gabapentin] Other (See Comments)    Blurred vision, funny feeling in head- side effect not allergy   Simvastatin Other (See Comments)    Muscle aches in legs, fatigue- not allergic   Sulfa Antibiotics Itching    Skin Irritation (Burning, Stinging)    Tizanidine Other (See Comments)    Abdominal/chest pain/ "too relaxed"   Trazodone And Nefazodone Other (See Comments)    Pt stated, "felt like had a little seizure; eyes started jumping; felt weirdness in top of head; sweating and felt sick - clammy and dizziness"   Past Surgical History:  Procedure Laterality Date   ABDOMINAL HYSTERECTOMY     ABDOMINAL SURGERY     hernia repair   CERVICAL ABLATION     CHOLECYSTECTOMY     COLONOSCOPY  May 2011   Dr. Gala Romney: Normal terminal ileum to 10 cm, normal colonoscopy.   ESOPHAGOGASTRODUODENOSCOPY  04/14/2006   JHE:[id number] esophagus with esophagogastric junction at 36 cm from the incisors.  Intact Nissen fundoplication, otherwise normal   ESOPHAGOGASTRODUODENOSCOPY  May 2011   Dr. Gala Romney: normal, status post intact Nissen Fundoplication, antral erosions and erythema with chronic gastritis but no H. pylori on biopsies.   ESOPHAGOGASTRODUODENOSCOPY N/A 12/03/2014   Dr.Rourk- intact fundoplication, antral erosions s/p bx. Bx= focally eroded reactive gastopathy negative for hpylori   EXTERNAL EAR SURGERY     HERNIA REPAIR     NISSEN FUNDOPLICATION  1443   NOSE SURGERY     SHOULDER SURGERY     TONSILLECTOMY       Social Connections:    Frequency of Communication with Friends and Family: Not on file   Frequency of Social Gatherings with Friends and Family: Not on file     Attends Religious Services: Not on file   Active Member of Clubs or Organizations: Not on file   Attends Archivist Meetings: Not on file   Marital Status: Not on file   Social History   Socioeconomic History   Marital status: Widowed    Spouse name: Annie Main   Number of children: 3   Years of education: 9   Highest education level: Not on file  Occupational History   Not on file  Tobacco Use   Smoking status: Never Smoker   Smokeless tobacco: Never Used  Vaping Use   Vaping Use: Never used  Substance and Sexual Activity   Alcohol use: Yes    Comment: occ glass of red wine, 01-06-2017 RARELY   Drug use: Yes    Types: Marijuana    Comment: occ. 01-06-2017 PER PT [facility]Y [facility]   Sexual activity: Yes    Birth control/protection: Surgical    Comment: hyst  Other Topics Concern   Not on file  Social History Narrative   Lives independently   Daughter and granddaughter live with her      Disabled- for stomach problems and orthopedic problems, nerves   Social Determinants of Health   Financial Resource Strain:    Difficulty of Paying Living Expenses: Not on file  Food Insecurity:    Worried About Charity fundraiser in the Last Year: Not on file   [employer] of Food in the Last Year: Not on file  Transportation Needs:    Lack of Transportation (Medical): Not on file   Lack of Transportation (Non-Medical): Not on file  Physical Activity:    Days of Exercise per Week: Not on file   Minutes of Exercise per Session: Not on file  Stress:    Feeling of Stress : Not on file  Social Connections:    Frequency of Communication with Friends and Family: Not on file   Frequency of Social Gatherings with Friends and Family: Not on file   Attends Religious Services: Not on file   Active Member of Clubs or Organizations: Not on file   Attends Archivist Meetings: Not on file   Marital Status: Not on file  Intimate Partner  Violence:    Fear of Current or Ex-Partner: Not on file   Emotionally Abused: Not on file   Physically Abused: Not on file   Sexually Abused: Not on file   Current Outpatient Medications on File Prior to Visit  Medication Sig Dispense Refill   acetaminophen (TYLENOL) 500 MG tablet Take 500 mg by mouth every 6 (six) hours as needed for pain.     Ascorbic Acid (VITAMIN C) 100 MG tablet Take 100 mg by mouth daily.  aspirin 81 MG tablet Take 81 mg by mouth daily.      Calcium Citrate (CITRACAL PO) Take 1 tablet by mouth 2 (two) times daily.      clonazePAM (KLONOPIN) 1 MG tablet Take 1 mg by mouth 2 (two) times daily.     diphenhydrAMINE (BENADRYL) 25 MG tablet Take 25 mg by mouth daily as needed for allergies.     DULoxetine (CYMBALTA) 20 MG capsule Take 2 capsules by mouth daily.     Flaxseed, Linseed, (FLAX SEEDS PO) Take by mouth.     ibuprofen (ADVIL) 800 MG tablet Take 800 mg by mouth every 8 (eight) hours as needed.     Magnesium Chloride (MAGNESIUM DR PO) Take 1 tablet by mouth daily.     Multiple Vitamin (MULTIVITAMIN WITH MINERALS) TABS Take 1 tablet by mouth daily.     Probiotic Product (PROBIOTIC PO) Take by mouth daily.     PROVENTIL HFA 108 (90 Base) MCG/ACT inhaler INHALE TWO PUFFS BY MOUTH EVERY 6 HOURS AS NEEDED FOR SHORTNESS OF BREATH 18 g 1   atorvastatin (LIPITOR) 10 MG tablet TAKE ONE TABLET BY MOUTH ONCE DAILY (Patient not taking: Reported on 06/21/2020) 90 tablet 0   methocarbamol (ROBAXIN) 500 MG tablet methocarbamol 500 mg tablet  Take 2 tablets every day by oral route as needed.     oxyCODONE-acetaminophen (PERCOCET/ROXICET) 5-325 MG tablet oxycodone-acetaminophen 5 mg-325 mg tablet  TAKE 1 2 (ONE HALF) TABLET BY MOUTH TWICE DAILY AS NEEDED     Current Facility-Administered Medications on File Prior to Visit  Medication Dose Route Frequency Provider Last Rate Last Admin   betamethasone acetate-betamethasone sodium phosphate (CELESTONE)  injection 3 mg  3 mg Intramuscular Once Edrick Kins, DPM        Thyroid Problem Presents for initial visit. The condition has lasted for 1 year. Symptoms include cold intolerance and constipation. Patient reports no anxiety, diarrhea, dry skin, fatigue, hair loss, leg swelling, palpitations, tremors, weight gain or weight loss. The symptoms have been worsening. Past treatments include nothing. Prior procedures include thyroid ultrasound. (Showing 1.1 cm left inferior lobe nodule) Her past medical history is significant for hyperlipidemia and osteopenia. (GERD) There are no known risk factors.    Review of Systems  Constitutional: Positive for chills. Negative for activity change, appetite change, fatigue, weight gain and weight loss.  HENT: Positive for ear pain, sore throat, trouble swallowing and voice change.        Right ear- when swallowing  Respiratory: Positive for cough. Negative for choking.   Cardiovascular: Negative for palpitations and leg swelling.  Gastrointestinal: Positive for constipation. Negative for diarrhea.       Chronic constipation  Endocrine: Positive for cold intolerance.  Skin: Negative for color change, pallor and rash.  Neurological: Negative for tremors.  Psychiatric/Behavioral: The patient is not nervous/anxious.        Objective:   Physical Exam Constitutional:      Appearance: Normal appearance.  Neck:     Thyroid: No thyromegaly or thyroid tenderness.      Comments: Tenderness to palpation Cardiovascular:     Rate and Rhythm: Normal rate and regular rhythm.     Pulses: Normal pulses.     Heart sounds: Normal heart sounds.  Pulmonary:     Effort: Pulmonary effort is normal.     Breath sounds: Normal breath sounds.  Musculoskeletal:     Cervical back: Tenderness present.  Lymphadenopathy:     Cervical: Cervical adenopathy present.  Right cervical: Superficial cervical adenopathy present.     Left cervical: Superficial cervical adenopathy  present.  Skin:    General: Skin is warm and dry.  Neurological:     General: No focal deficit present.     Mental Status: She is alert and oriented to person, place, and time.  Psychiatric:        Mood and Affect: Mood normal.        Behavior: Behavior normal.        Thought Content: Thought content normal.   BP 129/74 (BP Location: Right Arm, Patient Position: Sitting)    Pulse 73    Ht 5\' 2"  (1.575 m)    Wt 143 lb 9.6 oz (65.1 kg)    BMI 26.26 kg/m   Last Weight  Most recent update: 06/21/2020  8:44 AM   Weight  65.1 kg (143 lb 9.6 oz)           BP Readings from Last 3 Encounters:  06/21/20 129/74  02/01/20 (!) 112/59  01/08/20 135/64   CMP Latest Ref Rng & Units 11/15/2017 05/17/2017 12/16/2016  Glucose 65 - 99 mg/dL 100(H) 90 -  BUN 7 - 25 mg/dL 10 8 -  Creatinine 0.50 - 0.99 mg/dL 0.83 0.71 -  Sodium 135 - 146 mmol/L 141 140 -  Potassium 3.5 - 5.3 mmol/L 4.3 4.1 -  Chloride 98 - 110 mmol/L 104 105 -  CO2 20 - 32 mmol/L 29 28 -  Calcium 8.6 - 10.4 mg/dL 10.0 9.1 -  Total Protein 6.1 - 8.1 g/dL 7.4 6.6 6.6  Total Bilirubin 0.2 - 1.2 mg/dL 0.4 0.2 0.4  Alkaline Phos 33 - 130 U/L - 75 73  AST 10 - 35 U/L 20 17 20   ALT 6 - 29 U/L 14 14 17     Results for LOVETA, DELLIS (MRN [id number]) as of 06/21/2020 11:18  Ref. Range 11/02/2016 14:27 11/15/2017 14:03  TSH Latest Ref Range: 0.40 - 4.50 mIU/L 0.45 0.70   US soft tissue Head/Neck 08/14/2019  Impression: Right lobe: 4.7 x 1.6 x 1.9 cm Left lobe: 4.8 x 1.8 x 1.8 cm  Isthmus: 0.2 cm Estimated total number of nodules greater than or equal to 1 cm: 1  Nodule #1: Location: Left; inferior Maximum size: 1.1 cm; other 2 dimensions: 0.5 x 0.4 cm Composition: Cystic/almost completely cystic Echogenicity: Anechoic Shape: Not taller than wide Margins: Smooth ACR TI-RADS total points: 0 This nodule does not meet TI-RADS criteria for biopsy or dedicated follow-up.  Other scattered small cysts have a benign  appearance.   Assessment:     1) Nodular thyroid    Plan:     -The lump that she is referring to in her throat and the associated symptoms does not appear to be related to her thyroid.  However, more information is needed to correctly diagnose and manage her ailment.  To assist in ruling out thyroid etiology, will schedule for repeat thyroid ultrasound.   -Given the lack of recent thyroid function tests to review, will also run more complete thyroid profile prior to next visit.  She may benefit from additional imaging to follow-up on her GERD.  She is advised to follow-up with PCP regarding all other needs: Leeanne Rio, MD  - Time spent with the patient: 45 minutes, of which >50% was spent in obtaining information about her symptoms, reviewing her previous labs, evaluations, and treatments, and developing a plan for long term treatment; she had a number of  questions which I addressed.    Follow-Up Return in about 2 weeks (around 07/05/2020) for Thyroid follow up- to get Ultrasound prior to visit; Get labs done today.     Rayetta Pigg, FNP-BC [facility] Phone: [phone] Fax: [phone]

## 2020-06-22 LAB — T4, FREE: Free T4: 1.15 ng/dL (ref 0.82–1.77)

## 2020-06-22 LAB — THYROGLOBULIN ANTIBODY: Thyroglobulin Antibody: <1 IU/mL (ref 0.0–0.9)

## 2020-06-22 LAB — THYROID PEROXIDASE ANTIBODY: Thyroperoxidase Ab SerPl-aCnc: <8 IU/mL (ref 0–34)

## 2020-06-22 LAB — TSH: TSH: 0.999 u[IU]/mL (ref 0.450–4.500)

## 2020-06-22 LAB — T3, FREE: T3, Free: 3.9 pg/mL (ref 2.0–4.4)

## 2020-06-28 ENCOUNTER — Other Ambulatory Visit: Payer: Self-pay

## 2020-06-28 ENCOUNTER — Ambulatory Visit (HOSPITAL_COMMUNITY)
Admission: RE | Admit: 2020-06-28 | Discharge: 2020-06-28 | Disposition: A | Discharge status: Home / Self Care | Payer: Medicare HMO | Source: Ambulatory Visit | Attending: Nurse Practitioner | Admitting: Nurse Practitioner

## 2020-06-28 DIAGNOSIS — E041 Nontoxic single thyroid nodule: Secondary | ICD-10-CM | POA: Diagnosis not present

## 2020-07-02 ENCOUNTER — Ambulatory Visit (HOSPITAL_COMMUNITY): Payer: Medicare HMO

## 2020-07-05 ENCOUNTER — Ambulatory Visit: Payer: Medicare HMO | Admitting: Nurse Practitioner

## 2020-07-08 ENCOUNTER — Encounter: Payer: Self-pay | Admitting: Nurse Practitioner

## 2020-07-08 ENCOUNTER — Other Ambulatory Visit: Payer: Self-pay

## 2020-07-08 ENCOUNTER — Ambulatory Visit (HOSPITAL_COMMUNITY): Payer: Medicare HMO

## 2020-07-08 ENCOUNTER — Telehealth (INDEPENDENT_AMBULATORY_CARE_PROVIDER_SITE_OTHER): Payer: Medicare HMO | Admitting: Nurse Practitioner

## 2020-07-08 DIAGNOSIS — E041 Nontoxic single thyroid nodule: Secondary | ICD-10-CM

## 2020-07-08 NOTE — Progress Notes (Signed)
07/08/20   Endocrinology Follow Up Note   TELEHEALTH VISIT: The patient is being engaged in telehealth visit due to COVID-19.  This type of visit limits physical examination significantly, and thus is not preferable over face-to-face encounters.  I connected with  Liam Rogers on 07/08/20 by a video enabled telemedicine application and verified that I am speaking with the correct person using two identifiers.   I discussed the limitations of evaluation and management by telemedicine. The patient expressed understanding and agreed to proceed.    The participants involved in this visit include: Brita Romp, NP located at [facility] and Liam Rogers  located at their personal residence listed.   Subjective:     Patient ID: Destiny Young, female   DOB: 20-Sep-1956, 64 y.o.   MRN: [id number] Patient presents today in follow up after being seen in consultation for thyroid work up as requested by Leeanne Rio, MD.  She complains of neck fullness, feeling hard knots in her throat, sore throat when swallowing with associated right ear pain, raspy voice, and waking with a bitter taste in her mouth.  She denies any family history of thyroid disease, except for her niece who has hypothyroidism.  She has never taken any medications for her thyroid, denies use of Biotin supplement.  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  She has a history of GERD requiring Nissen wrap and revision.    -She has no new complaints today, states she is still having the same symptoms she had on previous visit.  She had a previous thyroid ultrasound on 08/14/2019 due to similar complaints showing small nodule in left inferior thyroid lobe measuring 1.1 cm.  She was sent for additional testing after last visit.  Recent labs and thyroid US reviewed.  Past Medical History:  Diagnosis Date  . Allergy   . Anxiety   . Aortic insufficiency   . Arthritis    bulging discs   . Asthma   . Bradycardia   . Burning chest pain   . Cancer (HCC)    abnormal PAP  . Depression   . Ejection fraction   . GERD (gastroesophageal reflux disease)    History of Nissen fundoplication  . Hemorrhoids   . High cholesterol   . History of Nissen fundoplication    4315  . IBS (irritable bowel syndrome)   . Nodular thyroid disease 06/14/2020  . Pelvic pain in female 12/05/2015  . Pelvic relaxation 12/05/2015  . Rectocele 12/05/2015  . Seizures ([place])    per Dr Merlene Laughter  . Skin tag 12/05/2015  . TMJ (dislocation of temporomandibular joint)   . Ventricular tachycardia ([place]) 08/21/2013   7 beats ventricular tachycardia, rate 180, 8 day event recorder, July 13, 2013  //   nuclear stress study normal, November, 2014, normal EF by 2-D and nuclear    Allergies  Allergen Reactions  . Demerol [Meperidine]   . Ciprofloxacin Other (See Comments)    "Skin burning"- feeling, not allergy  . Codeine Nausea Only    Nausea and sweats  . Meperidine Hcl Nausea And Vomiting    GI intolerance  . Metaxalone Nausea Only    Stomach Irritation   . Naproxen Nausea Only    Stomach Irritation   . Neurontin [Gabapentin] Other (See Comments)    Blurred vision, funny feeling in head- side effect not allergy  . Simvastatin Other (See Comments)    Muscle aches in legs, fatigue- not allergic  . Sulfa  Antibiotics Itching    Skin Irritation (Burning, Stinging)   . Tizanidine Other (See Comments)    Abdominal/chest pain/ "too relaxed"  . Trazodone And Nefazodone Other (See Comments)    Pt stated, "felt like had a little seizure; eyes started jumping; felt weirdness in top of head; sweating and felt sick - clammy and dizziness"   Past Surgical History:  Procedure Laterality Date  . ABDOMINAL HYSTERECTOMY    . ABDOMINAL SURGERY     hernia repair  . CERVICAL ABLATION    . CHOLECYSTECTOMY    . COLONOSCOPY  May 2011   Dr. Gala Romney: Normal terminal ileum to 10 cm, normal colonoscopy.  .  ESOPHAGOGASTRODUODENOSCOPY  04/14/2006   [id number] esophagus with esophagogastric junction at 36 cm from the incisors.  Intact Nissen fundoplication, otherwise normal  . ESOPHAGOGASTRODUODENOSCOPY  May 2011   Dr. Gala Romney: normal, status post intact Nissen Fundoplication, antral erosions and erythema with chronic gastritis but no H. pylori on biopsies.  . ESOPHAGOGASTRODUODENOSCOPY N/A 12/03/2014   Dr.Rourk- intact fundoplication, antral erosions s/p bx. Bx= focally eroded reactive gastopathy negative for hpylori  . EXTERNAL EAR SURGERY    . HERNIA REPAIR    . NISSEN FUNDOPLICATION  0865  . NOSE SURGERY    . SHOULDER SURGERY    . TONSILLECTOMY       Social Connections:   . Frequency of Communication with Friends and Family: Not on file  . Frequency of Social Gatherings with Friends and Family: Not on file  . Attends Religious Services: Not on file  . Active Member of Clubs or Organizations: Not on file  . Attends Archivist Meetings: Not on file  . Marital Status: Not on file   Social History   Socioeconomic History  . Marital status: Widowed    Spouse name: Annie Main  . Number of children: 3  . Years of education: 9  . Highest education level: Not on file  Occupational History  . Not on file  Tobacco Use  . Smoking status: Never Smoker  . Smokeless tobacco: Never Used  Vaping Use  . Vaping Use: Never used  Substance and Sexual Activity  . Alcohol use: Yes    Comment: occ glass of red wine, 01-06-2017 RARELY  . Drug use: Yes    Types: Marijuana    Comment: occ. 01-06-2017 PER PT [facility]Y [facility] PAIN [facility]  . Sexual activity: Yes    Birth control/protection: Surgical    Comment: hyst  Other Topics Concern  . Not on file  Social History Narrative   Lives independently   Daughter and granddaughter live with her      Disabled- for stomach problems and orthopedic problems, nerves   Social Determinants of Health   Financial Resource Strain:   .  Difficulty of Paying Living Expenses: Not on file  Food Insecurity:   . Worried About Charity fundraiser in the Last Year: Not on file  . Ran Out of Food in the Last Year: Not on file  Transportation Needs:   . Lack of Transportation (Medical): Not on file  . Lack of Transportation (Non-Medical): Not on file  Physical Activity:   . Days of Exercise per Week: Not on file  . Minutes of Exercise per Session: Not on file  Stress:   . Feeling of Stress : Not on file  Social Connections:   . Frequency of Communication with Friends and Family: Not on file  . Frequency of Social Gatherings with Friends  and Family: Not on file  . Attends Religious Services: Not on file  . Active Member of Clubs or Organizations: Not on file  . Attends Archivist Meetings: Not on file  . Marital Status: Not on file  Intimate Partner Violence:   . Fear of Current or Ex-Partner: Not on file  . Emotionally Abused: Not on file  . Physically Abused: Not on file  . Sexually Abused: Not on file   Current Outpatient Medications on File Prior to Visit  Medication Sig Dispense Refill  . acetaminophen (TYLENOL) 500 MG tablet Take 500 mg by mouth every 6 (six) hours as needed for pain.    . Ascorbic Acid (VITAMIN C) 100 MG tablet Take 100 mg by mouth daily.    Marland Kitchen aspirin 81 MG tablet Take 81 mg by mouth daily.     . Calcium Citrate (CITRACAL PO) Take 1 tablet by mouth 2 (two) times daily.     . clonazePAM (KLONOPIN) 1 MG tablet Take 1 mg by mouth 2 (two) times daily.    . diphenhydrAMINE (BENADRYL) 25 MG tablet Take 25 mg by mouth daily as needed for allergies.    . DULoxetine (CYMBALTA) 20 MG capsule Take 1 capsule by mouth daily.     . Flaxseed, Linseed, (FLAX SEEDS PO) Take by mouth.    Marland Kitchen glucosamine-chondroitin 500-400 MG tablet Take 1 tablet by mouth 3 (three) times daily.    Marland Kitchen ibuprofen (ADVIL) 800 MG tablet Take 800 mg by mouth every 8 (eight) hours as needed.    . Magnesium Chloride (MAGNESIUM DR PO)  Take 1 tablet by mouth daily.    . Multiple Vitamin (MULTIVITAMIN WITH MINERALS) TABS Take 1 tablet by mouth daily.    Marland Kitchen oxyCODONE-acetaminophen (PERCOCET/ROXICET) 5-325 MG tablet oxycodone-acetaminophen 5 mg-325 mg tablet  TAKE 1 2 (ONE HALF) TABLET BY MOUTH TWICE DAILY AS NEEDED    . Probiotic Product (PROBIOTIC PO) Take by mouth daily.    Marland Kitchen PROVENTIL HFA 108 (90 Base) MCG/ACT inhaler INHALE TWO PUFFS BY MOUTH EVERY 6 HOURS AS NEEDED FOR SHORTNESS OF BREATH 18 g 1   Current Facility-Administered Medications on File Prior to Visit  Medication Dose Route Frequency Provider Last Rate Last Admin  . betamethasone acetate-betamethasone sodium phosphate (CELESTONE) injection 3 mg  3 mg Intramuscular Once Edrick Kins, DPM        Thyroid Problem Presents for initial visit. The condition has lasted for 1 year. Symptoms include cold intolerance and constipation. Patient reports no anxiety, diarrhea, dry skin, fatigue, hair loss, leg swelling, palpitations, tremors, weight gain or weight loss. The symptoms have been worsening. Past treatments include nothing. Prior procedures include thyroid ultrasound. (Showing 1.1 cm left inferior lobe nodule) Her past medical history is significant for hyperlipidemia and osteopenia. (GERD) There are no known risk factors.    Review of Systems  Constitutional: Positive for chills. Negative for activity change, appetite change, fatigue, weight gain and weight loss.  HENT: Positive for ear pain, sore throat, trouble swallowing and voice change.        Right ear- when swallowing  Respiratory: Positive for cough. Negative for choking.   Cardiovascular: Negative for palpitations and leg swelling.  Gastrointestinal: Positive for constipation. Negative for diarrhea.       Chronic constipation  Endocrine: Positive for cold intolerance.  Skin: Negative for color change, pallor and rash.  Neurological: Negative for tremors.  Psychiatric/Behavioral: The patient is not  nervous/anxious.  Objective:   There were no vitals taken for this visit.   BP Readings from Last 3 Encounters:  06/21/20 129/74  02/01/20 (!) 112/59  01/08/20 135/64   Physical Exam- Telehealth- significantly limited due to nature of visit  Constitutional: There is no height or weight on file to calculate BMI. , not in acute distress, normal state of mind Respiratory: Adequate breathing efforts  CMP Latest Ref Rng & Units 11/15/2017 05/17/2017 12/16/2016  Glucose 65 - 99 mg/dL 100(H) 90 -  BUN 7 - 25 mg/dL 10 8 -  Creatinine 0.50 - 0.99 mg/dL 0.83 0.71 -  Sodium 135 - 146 mmol/L 141 140 -  Potassium 3.5 - 5.3 mmol/L 4.3 4.1 -  Chloride 98 - 110 mmol/L 104 105 -  CO2 20 - 32 mmol/L 29 28 -  Calcium 8.6 - 10.4 mg/dL 10.0 9.1 -  Total Protein 6.1 - 8.1 g/dL 7.4 6.6 6.6  Total Bilirubin 0.2 - 1.2 mg/dL 0.4 0.2 0.4  Alkaline Phos 33 - 130 U/L - 75 73  AST 10 - 35 U/L 20 17 20   ALT 6 - 29 U/L 14 14 17     Results for DEBANY, VANTOL (MRN [id number]) as of 06/21/2020 11:18  Ref. Range 11/02/2016 14:27 11/15/2017 14:03  TSH Latest Ref Range: 0.40 - 4.50 mIU/L 0.45 0.70   Results for BIRDIA, JAYCOX (MRN [id number]) as of 07/08/2020 08:32  Ref. Range 06/08/2018 12:43 01/08/2020 14:24 01/18/2020 15:09 06/21/2020 09:19 06/28/2020 11:33  TSH Latest Ref Range: 0.450 - 4.500 uIU/mL    0.999   Triiodothyronine,Free,Serum Latest Ref Range: 2.0 - 4.4 pg/mL    3.9   T4,Free(Direct) Latest Ref Range: 0.82 - 1.77 ng/dL    1.15   Thyroperoxidase Ab SerPl-aCnc Latest Ref Range: 0 - 34 IU/mL    <8   Thyroglobulin Antibody Latest Ref Range: 0.0 - 0.9 IU/mL    <1.0     US soft tissue Head/Neck 08/14/2019  Impression: Right lobe: 4.7 x 1.6 x 1.9 cm Left lobe: 4.8 x 1.8 x 1.8 cm  Isthmus: 0.2 cm Estimated total number of nodules greater than or equal to 1 cm: 1  Nodule #1: Location: Left; inferior Maximum size: 1.1 cm; other 2 dimensions: 0.5 x 0.4 cm Composition: Cystic/almost completely  cystic Echogenicity: Anechoic Shape: Not taller than wide Margins: Smooth ACR TI-RADS total points: 0 This nodule does not meet TI-RADS criteria for biopsy or dedicated follow-up.  Other scattered small cysts have a benign appearance. ----------------------------------------------------------------------------------------------------------- Thyroid US on 06/28/20 CLINICAL DATA:  Other.  Neck fullness, dysphagia  EXAM: THYROID ULTRASOUND  TECHNIQUE: Ultrasound examination of the thyroid gland and adjacent soft tissues was performed.  COMPARISON:  Prior thyroid ultrasound 08/14/2019  FINDINGS: Parenchymal Echotexture: Mildly heterogenous  Isthmus: 0.2 cm  Right lobe: 4.8 x 2.1 x 1.7 cm  Left lobe: 4.7 x 2.6 x 1.9 cm  _________________________________________________________  Estimated total number of nodules >/= 1 cm: 1  Number of spongiform nodules >/=  2 cm not described below (TR1): 0  Number of mixed cystic and solid nodules >/= 1.5 cm not described below (TR2): 0  _________________________________________________________  No solid nodules of consequence identified within the thyroid gland. Stable appearance of benign colloid nodule in the left inferior gland.  IMPRESSION: Stable and essentially sonographically unremarkable appearance of the thyroid gland. No further follow-up required.   Assessment:     1) Nontoxic- Nodular thyroid    Plan:     -The lump that she is  referring to in her throat and the associated symptoms does not appear to be related to her thyroid based on clinical presentation and diagnostic work up. No further surveillance needed for small existing thyroid nodule.    -She was advised to reach back out to her PCP or GI doctor for advice if her symptoms persist.  She may benefit from additional imaging to follow-up on her GERD.  She is advised to follow-up with PCP regarding all other needs: Leeanne Rio, MD  I spent  15 minutes dedicated to the care of this patient on the date of this encounter to include pre-visit review of records, face-to-face time with the patient, and post visit ordering of  testing.    Follow-Up Return if symptoms worsen or fail to improve.     Rayetta Pigg, FNP-BC [facility] Phone: [phone] Fax: [phone]

## 2020-08-06 ENCOUNTER — Other Ambulatory Visit (HOSPITAL_COMMUNITY): Payer: Self-pay | Admitting: Family Medicine

## 2020-08-06 DIAGNOSIS — Z1231 Encounter for screening mammogram for malignant neoplasm of breast: Secondary | ICD-10-CM

## 2020-09-09 ENCOUNTER — Ambulatory Visit (HOSPITAL_COMMUNITY)
Admission: RE | Admit: 2020-09-09 | Discharge: 2020-09-09 | Disposition: A | Discharge status: Home / Self Care | Payer: Medicare HMO | Source: Ambulatory Visit | Attending: Family Medicine | Admitting: Family Medicine

## 2020-09-09 ENCOUNTER — Other Ambulatory Visit: Payer: Self-pay

## 2020-09-09 DIAGNOSIS — Z1231 Encounter for screening mammogram for malignant neoplasm of breast: Secondary | ICD-10-CM | POA: Insufficient documentation

## 2020-09-24 ENCOUNTER — Telehealth: Payer: Self-pay

## 2020-09-24 NOTE — Telephone Encounter (Signed)
Pt's apt was pushed out to January due to needing a Monday or Friday appointment. Please call pt if any December appointments become available on a Monday or Friday.

## 2020-10-08 ENCOUNTER — Ambulatory Visit: Payer: Medicare HMO | Admitting: Internal Medicine

## 2020-11-08 ENCOUNTER — Ambulatory Visit: Payer: Medicare HMO | Admitting: Gastroenterology

## 2020-12-27 ENCOUNTER — Other Ambulatory Visit: Payer: Self-pay

## 2020-12-27 ENCOUNTER — Ambulatory Visit (INDEPENDENT_AMBULATORY_CARE_PROVIDER_SITE_OTHER): Payer: Medicare Other | Admitting: Gastroenterology

## 2020-12-27 ENCOUNTER — Encounter: Payer: Self-pay | Admitting: Gastroenterology

## 2020-12-27 VITALS — BP 144/78 | HR 65 | Temp 97.1°F | Ht 62.0 in | Wt 146.4 lb

## 2020-12-27 DIAGNOSIS — R1031 Right lower quadrant pain: Secondary | ICD-10-CM

## 2020-12-27 DIAGNOSIS — R5383 Other fatigue: Secondary | ICD-10-CM

## 2020-12-27 DIAGNOSIS — R1011 Right upper quadrant pain: Secondary | ICD-10-CM | POA: Diagnosis not present

## 2020-12-27 DIAGNOSIS — R110 Nausea: Secondary | ICD-10-CM | POA: Diagnosis not present

## 2020-12-27 DIAGNOSIS — R140 Abdominal distension (gaseous): Secondary | ICD-10-CM

## 2020-12-27 DIAGNOSIS — R1319 Other dysphagia: Secondary | ICD-10-CM

## 2020-12-27 DIAGNOSIS — R112 Nausea with vomiting, unspecified: Secondary | ICD-10-CM

## 2020-12-27 DIAGNOSIS — K5900 Constipation, unspecified: Secondary | ICD-10-CM

## 2020-12-27 MED ORDER — LUBIPROSTONE 24 MCG PO CAPS: 24.0000 ug | ORAL_CAPSULE | Freq: Two times a day (BID) | ORAL | 5 refills | Status: DC

## 2020-12-27 NOTE — Progress Notes (Signed)
Primary Care Physician: Leeanne Rio, MD  Primary Gastroenterologist:    Chief Complaint  Patient presents with  . Abdominal Pain    Going on for months, RUQ/RLQ, constant. Also states sharp pain goes into mid back. Spasms in chest  . Nausea  . Constipation    All the time. Fiber does not seem to help much nor stool softners. Then it causing more cramping/nausea    HPI: Destiny Young is a 65 y.o. female here for further evaluation of right sided abdominal pain, nausea, constipation. Last seen in 2017. H/o remote fundoplication in 2426 converted to lap Toupet in 2018 at time of cholecystectomy.    EGD 2016 with intact fundoplication and non H.pylori gastric erosions.  GES (2 hour) 2017 normal with 4.8% retention at 120 minutes.   MRCP 2016 with gallstones but no choledocholithiasis. Colonoscopy 2011 normal colon and TI.  Patient presents today with multiple GI concerns. Complains of RUQ/RLQ pain which radiates into back and clavicle area. Associated with nausea. Going on for several months. Symptoms progressive. She complains of a lot of fatigue. Taking care of 62 y/o mother and grandchildren. BMs are [place] 3-5. Can go several days without BM and then has to take something. Has tried fiber, stool softeners, magnesium citrate. Tries to avoid using her oxycodone due to the constipation. She complains of constantly needing to clear her throat. Feels hard to swallow. Has to chew food very finely. Has pressure and discomfort ruq with eating, drinking. bread and dairy cause more abdominal pain.   Ibuprofen 800mg  for bad pain.    Current Outpatient Medications  Medication Sig Dispense Refill  . acetaminophen (TYLENOL) 500 MG tablet Take 500 mg by mouth every 6 (six) hours as needed for pain.    . Ascorbic Acid (VITAMIN C) 100 MG tablet Take 100 mg by mouth daily.    Marland Kitchen aspirin 81 MG tablet Take 81 mg by mouth daily.     Marland Kitchen atorvastatin (LIPITOR) 10 MG tablet Take 10 mg by  mouth daily.    . Calcium Citrate (CITRACAL PO) Take 1 tablet by mouth daily.    . clonazePAM (KLONOPIN) 1 MG tablet Take 1 mg by mouth daily. As needed    . diphenhydrAMINE (BENADRYL) 25 MG tablet Take 25 mg by mouth daily as needed for allergies.    . DULoxetine (CYMBALTA) 20 MG capsule Take 1 capsule by mouth daily.     . Flaxseed, Linseed, (FLAX SEEDS PO) Take by mouth.    Marland Kitchen glucosamine-chondroitin 500-400 MG tablet Take 1 tablet by mouth 3 (three) times daily.    Marland Kitchen ibuprofen (ADVIL) 800 MG tablet Take 800 mg by mouth every 8 (eight) hours as needed.    . Magnesium Chloride (MAGNESIUM DR PO) Take 1 tablet by mouth daily.    . Multiple Vitamin (MULTIVITAMIN WITH MINERALS) TABS Take 1 tablet by mouth daily.    Marland Kitchen oxyCODONE-acetaminophen (PERCOCET/ROXICET) 5-325 MG tablet oxycodone-acetaminophen 5 mg-325 mg tablet  TAKE 1 2 (ONE HALF) TABLET BY MOUTH TWICE DAILY AS NEEDED    . Probiotic Product (PROBIOTIC PO) Take by mouth daily.    Marland Kitchen PROVENTIL HFA 108 (90 Base) MCG/ACT inhaler INHALE TWO PUFFS BY MOUTH EVERY 6 HOURS AS NEEDED FOR SHORTNESS OF BREATH 18 g 1  . tiZANidine (ZANAFLEX) 2 MG tablet Take 2 mg by mouth at bedtime.     Current Facility-Administered Medications  Medication Dose Route Frequency Provider Last Rate Last Admin  . betamethasone  acetate-betamethasone sodium phosphate (CELESTONE) injection 3 mg  3 mg Intramuscular Once Edrick Kins, DPM        Allergies as of 12/27/2020 - Review Complete 12/27/2020  Allergen Reaction Noted  . Demerol [meperidine]  01/08/2020  . Ciprofloxacin Other (See Comments)   . Codeine Nausea Only   . Meperidine hcl Nausea And Vomiting   . Metaxalone Nausea Only   . Naproxen Nausea Only 07/29/2012  . Neurontin [gabapentin] Other (See Comments) 12/05/2015  . Simvastatin Other (See Comments) 08/21/2013  . Sulfa antibiotics Itching   . Tizanidine Other (See Comments) 07/05/2013  . Trazodone and nefazodone Other (See Comments) 01/27/2017   Past  Medical History:  Diagnosis Date  . Allergy   . Anxiety   . Aortic insufficiency   . Arthritis    bulging discs  . Asthma   . Bradycardia   . Burning chest pain   . Cancer (HCC)    abnormal PAP  . Depression   . Ejection fraction   . GERD (gastroesophageal reflux disease)    History of Nissen fundoplication  . Hemorrhoids   . High cholesterol   . History of Nissen fundoplication    5176  . IBS (irritable bowel syndrome)   . Nodular thyroid disease 06/14/2020  . Pelvic pain in female 12/05/2015  . Pelvic relaxation 12/05/2015  . Rectocele 12/05/2015  . Seizures ([place])    per Dr Merlene Laughter  . Skin tag 12/05/2015  . TMJ (dislocation of temporomandibular joint)   . Ventricular tachycardia ([place]) 08/21/2013   7 beats ventricular tachycardia, rate 180, 8 day event recorder, July 13, 2013  //   nuclear stress study normal, November, 2014, normal EF by 2-D and nuclear    Past Surgical History:  Procedure Laterality Date  . ABDOMINAL HYSTERECTOMY    . ABDOMINAL SURGERY     hernia repair  . CERVICAL ABLATION    . CHOLECYSTECTOMY    . COLONOSCOPY  May 2011   Dr. Gala Romney: Normal terminal ileum to 10 cm, normal colonoscopy.  . ESOPHAGOGASTRODUODENOSCOPY  04/14/2006   [id number] esophagus with esophagogastric junction at 36 cm from the incisors.  Intact Nissen fundoplication, otherwise normal  . ESOPHAGOGASTRODUODENOSCOPY  May 2011   Dr. Gala Romney: normal, status post intact Nissen Fundoplication, antral erosions and erythema with chronic gastritis but no H. pylori on biopsies.  . ESOPHAGOGASTRODUODENOSCOPY N/A 12/03/2014   Dr.Rourk- intact fundoplication, antral erosions s/p bx. Bx= focally eroded reactive gastopathy negative for hpylori  . EXTERNAL EAR SURGERY    . HERNIA REPAIR    . NISSEN FUNDOPLICATION  7035  . NOSE SURGERY    . SHOULDER SURGERY    . TONSILLECTOMY     Family History  Problem Relation Age of Onset  . Lung cancer Father   . Cancer Father        lung cancer   . Irritable bowel syndrome Sister   . Irritable bowel syndrome Sister   . Hyperlipidemia Sister   . Hypertension Sister   . Vascular Disease Sister   . Hyperlipidemia Sister   . Hypertension Sister   . Hyperlipidemia Mother   . GER disease Mother   . Irritable bowel syndrome Mother   . Hypertension Brother   . Hyperlipidemia Brother   . Cancer Brother        lung  . Hypertension Son   . Hyperlipidemia Son   . Stroke Maternal Grandmother   . Diabetes Maternal Grandmother   . Hypertension Maternal Grandmother   .  Hyperlipidemia Maternal Grandmother   . Hypertension Maternal Grandfather   . Heart attack Maternal Grandfather   . Diabetes Paternal Grandmother   . Hyperlipidemia Paternal Grandmother   . Hypertension Paternal Grandmother   . Kidney disease Paternal Grandmother   . Hypertension Paternal Grandfather   . Hyperlipidemia Paternal Grandfather   . Heart attack Paternal Grandfather   . Diabetes Sister   . Hypertension Son   . Cancer Paternal Aunt        ovarian   Social History   Tobacco Use  . Smoking status: Never Smoker  . Smokeless tobacco: Never Used  Vaping Use  . Vaping Use: Never used  Substance Use Topics  . Alcohol use: Yes    Comment: occ glass of red wine, 01-06-2017 RARELY  . Drug use: Yes    Types: Marijuana    Comment: occ. 01-06-2017 PER PT TRY [facility] FOR PAIN IN [place]    ROS:  General: Negative for anorexia, weight loss, fever, chills, fatigue, weakness. ENT: Negative for hoarseness, nasal congestion. See hpi CV: Negative for chest pain, angina, palpitations, dyspnea on exertion, peripheral edema.  Respiratory: Negative for dyspnea at rest, dyspnea on exertion, cough, sputum, wheezing.  GI: See history of present illness. GU:  Negative for dysuria, hematuria, urinary incontinence, urinary frequency, nocturnal urination.  Endo: Negative for unusual weight change.    Physical Examination:   BP (!) 144/78   Pulse 65   Temp (!) 97.1 F (36.2  C)   Ht 5\' 2"  (1.575 m)   Wt 146 lb 6.4 oz (66.4 kg)   BMI 26.78 kg/m   General: Well-nourished, well-developed in no acute distress.  Eyes: No icterus. Mouth: masked. Lungs: Clear to auscultation bilaterally.  Heart: Regular rate and rhythm, no murmurs rubs or gallops.  Abdomen: Bowel sounds are normal, nondistended, no hepatosplenomegaly or masses, no abdominal bruits or hernia , no rebound or guarding.  Tender in right mid/low abdomen with deep palpation. Extremities: No lower extremity edema. No clubbing or deformities. Neuro: Alert and oriented x 4   Skin: Warm and dry, no jaundice.   Psych: Alert and cooperative, normal mood and affect.   Imaging Studies: No results found.  Assessment:  65 y/o female presenting for further evaluation of right sided abd pain, nausea, constipation. She also complains of dysphagia. She has h/o remote Nissen fundoplication in 6629 since converted to lap Toupet in 2018.  Etiology of her right sided abdominal pain not clear. Pain involves the ruq to rlq. Symptoms worse with meals. She has had her gallbladder removed previously. Symptoms occurring for months making appendicitis less likely but cannot exclude chronic appendicitis or other infectious etiology.  Constipation, poorly managed.   Esophageal dysphagia, intermittent. Prior Nissen fundoplication but converted to partial wrap (lap Toupet). EGD in 2016. Denies typical heartburn. Currently no PPI. Patient wants to hold off on PPI for now.   Nausea, likely multifactorial in part due to constipation, abdominal pain.   Plan:  1. Amitiza 31mcg once or twice daily.  2. Labs including celiac screen, CMET, CBC, lipase. 3. CT A/P with contrast.

## 2020-12-27 NOTE — Patient Instructions (Signed)
1. CT scan of your abdomen to be scheduled. 2. Please go for labs at [place]. 3. Start Amitiza 14mcg once or twice daily with food for constipation.  4. We will be in touch with results as available.

## 2020-12-29 ENCOUNTER — Encounter: Payer: Self-pay | Admitting: Gastroenterology

## 2020-12-29 LAB — COMPREHENSIVE METABOLIC PANEL
ALT: 18 IU/L (ref 0–32)
AST: 25 IU/L (ref 0–40)
Albumin/Globulin Ratio: 1.7 (ref 1.2–2.2)
Albumin: 4.7 g/dL (ref 3.8–4.8)
Alkaline Phosphatase: 95 IU/L (ref 44–121)
BUN/Creatinine Ratio: 9 — ABNORMAL LOW (ref 12–28)
BUN: 8 mg/dL (ref 8–27)
Bilirubin Total: 0.4 mg/dL (ref 0.0–1.2)
CO2: 24 mmol/L (ref 20–29)
Calcium: 10.2 mg/dL (ref 8.7–10.3)
Chloride: 102 mmol/L (ref 96–106)
Creatinine, Ser: 0.87 mg/dL (ref 0.57–1.00)
Globulin, Total: 2.8 g/dL (ref 1.5–4.5)
Glucose: 109 mg/dL — ABNORMAL HIGH (ref 65–99)
Potassium: 4.3 mmol/L (ref 3.5–5.2)
Sodium: 142 mmol/L (ref 134–144)
Total Protein: 7.5 g/dL (ref 6.0–8.5)
eGFR: 75 mL/min/{1.73_m2} (ref >59)

## 2020-12-29 LAB — CBC WITH DIFFERENTIAL/PLATELET
Basophils Absolute: 0 10*3/uL (ref 0.0–0.2)
Basos: 1 %
EOS (ABSOLUTE): 0.1 10*3/uL (ref 0.0–0.4)
Eos: 2 %
Hematocrit: 37.4 % (ref 34.0–46.6)
Hemoglobin: 12.5 g/dL (ref 11.1–15.9)
Immature Grans (Abs): 0 10*3/uL (ref 0.0–0.1)
Immature Granulocytes: 0 %
Lymphocytes Absolute: 2.1 10*3/uL (ref 0.7–3.1)
Lymphs: 42 %
MCH: 30.7 pg (ref 26.6–33.0)
MCHC: 33.4 g/dL (ref 31.5–35.7)
MCV: 92 fL (ref 79–97)
Monocytes Absolute: 0.4 10*3/uL (ref 0.1–0.9)
Monocytes: 8 %
Neutrophils Absolute: 2.4 10*3/uL (ref 1.4–7.0)
Neutrophils: 47 %
Platelets: 397 10*3/uL (ref 150–450)
RBC: 4.07 x10E6/uL (ref 3.77–5.28)
RDW: 11.8 % (ref 11.7–15.4)
WBC: 5.1 10*3/uL (ref 3.4–10.8)

## 2020-12-29 LAB — TISSUE TRANSGLUTAMINASE, IGA: Transglutaminase IgA: <2 U/mL (ref 0–3)

## 2020-12-29 LAB — LIPASE: Lipase: 38 U/L (ref 14–72)

## 2020-12-29 LAB — IGA: IgA/Immunoglobulin A, Serum: 355 mg/dL — ABNORMAL HIGH (ref 87–352)

## 2020-12-30 ENCOUNTER — Telehealth: Payer: Self-pay

## 2020-12-30 NOTE — Telephone Encounter (Signed)
CT abd/pelvis w/contrast scheduled for 01/23/21 at 3:00pm, arrive at 2:45pm. NPO 4 hours prior to test. Pick up contrast prior to appt.  Tried to call pt several times, phone didn't ring. Appt letter mailed.  No PA needed for CT per [facility] website.

## 2021-01-10 ENCOUNTER — Ambulatory Visit (HOSPITAL_COMMUNITY): Payer: Medicare Other

## 2021-01-13 ENCOUNTER — Encounter (HOSPITAL_COMMUNITY): Payer: Self-pay

## 2021-01-13 ENCOUNTER — Ambulatory Visit (HOSPITAL_COMMUNITY)
Admission: RE | Admit: 2021-01-13 | Discharge: 2021-01-13 | Disposition: A | Discharge status: Home / Self Care | Payer: Medicare Other | Source: Ambulatory Visit | Attending: Gastroenterology | Admitting: Gastroenterology

## 2021-01-13 ENCOUNTER — Other Ambulatory Visit: Payer: Self-pay

## 2021-01-13 DIAGNOSIS — R112 Nausea with vomiting, unspecified: Secondary | ICD-10-CM | POA: Insufficient documentation

## 2021-01-13 DIAGNOSIS — R140 Abdominal distension (gaseous): Secondary | ICD-10-CM | POA: Diagnosis not present

## 2021-01-13 DIAGNOSIS — R1031 Right lower quadrant pain: Secondary | ICD-10-CM | POA: Insufficient documentation

## 2021-01-13 DIAGNOSIS — I700 Atherosclerosis of aorta: Secondary | ICD-10-CM | POA: Diagnosis not present

## 2021-01-13 MED ORDER — IOHEXOL 300 MG/ML  SOLN
100.0000 mL | Freq: Once | INTRAMUSCULAR | Status: AC | PRN
  Administered 2021-01-13: 100 mL via INTRAVENOUS

## 2021-01-14 ENCOUNTER — Encounter: Payer: Self-pay | Admitting: Gastroenterology

## 2021-01-15 ENCOUNTER — Telehealth: Payer: Self-pay | Admitting: Internal Medicine

## 2021-01-15 NOTE — Telephone Encounter (Signed)
Noted. Spoke with pt. Pt was notified of recommendations. Pt will increase oral intake to make sure well hydrated. If headaches become severe, she will go to ED.

## 2021-01-15 NOTE — Telephone Encounter (Signed)
Spoke with pt. Pt pt states that she has been having headaches since her CT scan. Headaches start at the back of pts head and goes up. Pt is inquiring about results and is aware that when results have resulted, our office will contact her. Headaches are making pt nauseated. Pt isn't taking anything for nausea and says she doesn't feel she needs anything.

## 2021-01-15 NOTE — Telephone Encounter (Signed)
[phone]  Please call patient, she had a CT done and since she states she has a pain in her head.  I asked her if she contacted radiology and she said no.  Also has questions about her meds

## 2021-01-15 NOTE — Telephone Encounter (Signed)
Regarding headaches, not sure this is related to contrast from CT. Would encourage her to increase oral intake to make sure well hydrated. If headaches become severe, she should go to ER. We will be in touch with CT results.

## 2021-01-21 ENCOUNTER — Telehealth: Payer: Self-pay | Admitting: Internal Medicine

## 2021-01-21 NOTE — Telephone Encounter (Signed)
Pt was calling to see if her CT results were available. [phone]

## 2021-01-21 NOTE — Telephone Encounter (Signed)
Pt is inquiring about results.

## 2021-01-22 NOTE — Telephone Encounter (Signed)
Noted  

## 2021-01-22 NOTE — Telephone Encounter (Signed)
See result note.  

## 2021-01-23 ENCOUNTER — Other Ambulatory Visit (HOSPITAL_COMMUNITY): Payer: Medicare Other

## 2021-02-10 ENCOUNTER — Telehealth: Payer: Self-pay

## 2021-02-10 NOTE — Telephone Encounter (Signed)
Called pt, EGD/-/+DIL w/Propofol ASA 2 scheduled with Dr. Gala Romney for 03/27/21 at 3:00pm. COVID test 03/25/21 at 11:25pm. Orders entered. Appt letter mailed with procedure instructions.  PA for EGD/DIL submitted via [facility] website. PA# [id number], valid 03/27/21-06/25/21.

## 2021-03-20 ENCOUNTER — Telehealth: Payer: Self-pay | Admitting: *Deleted

## 2021-03-20 NOTE — Telephone Encounter (Signed)
Spoke with pt. Advised per recent email we received no covid test is required after 6/1. She is scheduled for 6/2.

## 2021-03-20 NOTE — Telephone Encounter (Signed)
Received VM from patient. She is scheduled for procedure with Dr. Gala Romney 6/2. Reports she was positive for covid on 5/13. She is still having some symptoms and wants to know if she should still have procedure done. Reports she has metal taste in her mouth, pain between rib cage, left ear is stopped up.

## 2021-03-25 ENCOUNTER — Other Ambulatory Visit (HOSPITAL_COMMUNITY): Payer: Medicare Other

## 2021-03-26 ENCOUNTER — Telehealth: Payer: Self-pay | Admitting: *Deleted

## 2021-03-26 NOTE — Telephone Encounter (Signed)
Received VM from pt asking what she can and can't eat.   Called pt and states she did not receive instructions in the mail. Discussed instructions in detail with pt. She voiced understanding

## 2021-03-27 ENCOUNTER — Encounter (HOSPITAL_COMMUNITY)
Admission: RE | Disposition: A | Discharge status: Home / Self Care | Payer: Self-pay | Source: Home / Self Care | Attending: Internal Medicine

## 2021-03-27 ENCOUNTER — Encounter (HOSPITAL_COMMUNITY): Payer: Self-pay | Admitting: Internal Medicine

## 2021-03-27 ENCOUNTER — Ambulatory Visit (HOSPITAL_COMMUNITY)
Admission: RE | Admit: 2021-03-27 | Discharge: 2021-03-27 | Disposition: A | Discharge status: Home / Self Care | Payer: Medicare Other | Attending: Internal Medicine | Admitting: Internal Medicine

## 2021-03-27 ENCOUNTER — Ambulatory Visit (HOSPITAL_COMMUNITY): Payer: Medicare Other | Admitting: Certified Registered"

## 2021-03-27 ENCOUNTER — Other Ambulatory Visit: Payer: Self-pay

## 2021-03-27 DIAGNOSIS — Z9049 Acquired absence of other specified parts of digestive tract: Secondary | ICD-10-CM | POA: Insufficient documentation

## 2021-03-27 DIAGNOSIS — R1013 Epigastric pain: Secondary | ICD-10-CM | POA: Insufficient documentation

## 2021-03-27 DIAGNOSIS — R1310 Dysphagia, unspecified: Secondary | ICD-10-CM | POA: Diagnosis not present

## 2021-03-27 DIAGNOSIS — Z90710 Acquired absence of both cervix and uterus: Secondary | ICD-10-CM | POA: Diagnosis not present

## 2021-03-27 DIAGNOSIS — R1011 Right upper quadrant pain: Secondary | ICD-10-CM | POA: Diagnosis not present

## 2021-03-27 DIAGNOSIS — Z8719 Personal history of other diseases of the digestive system: Secondary | ICD-10-CM | POA: Insufficient documentation

## 2021-03-27 DIAGNOSIS — K259 Gastric ulcer, unspecified as acute or chronic, without hemorrhage or perforation: Secondary | ICD-10-CM | POA: Diagnosis not present

## 2021-03-27 DIAGNOSIS — Z888 Allergy status to other drugs, medicaments and biological substances status: Secondary | ICD-10-CM | POA: Diagnosis not present

## 2021-03-27 DIAGNOSIS — Z881 Allergy status to other antibiotic agents status: Secondary | ICD-10-CM | POA: Insufficient documentation

## 2021-03-27 DIAGNOSIS — Z882 Allergy status to sulfonamides status: Secondary | ICD-10-CM | POA: Diagnosis not present

## 2021-03-27 DIAGNOSIS — R569 Unspecified convulsions: Secondary | ICD-10-CM | POA: Insufficient documentation

## 2021-03-27 DIAGNOSIS — K2950 Unspecified chronic gastritis without bleeding: Secondary | ICD-10-CM | POA: Diagnosis not present

## 2021-03-27 DIAGNOSIS — Z79899 Other long term (current) drug therapy: Secondary | ICD-10-CM | POA: Insufficient documentation

## 2021-03-27 DIAGNOSIS — Z8379 Family history of other diseases of the digestive system: Secondary | ICD-10-CM | POA: Diagnosis not present

## 2021-03-27 DIAGNOSIS — Z885 Allergy status to narcotic agent status: Secondary | ICD-10-CM | POA: Insufficient documentation

## 2021-03-27 DIAGNOSIS — Z886 Allergy status to analgesic agent status: Secondary | ICD-10-CM | POA: Diagnosis not present

## 2021-03-27 DIAGNOSIS — E7800 Pure hypercholesterolemia, unspecified: Secondary | ICD-10-CM | POA: Diagnosis not present

## 2021-03-27 DIAGNOSIS — Z7982 Long term (current) use of aspirin: Secondary | ICD-10-CM | POA: Diagnosis not present

## 2021-03-27 HISTORY — PX: MALONEY DILATION: SHX5535

## 2021-03-27 HISTORY — PX: ESOPHAGOGASTRODUODENOSCOPY (EGD) WITH PROPOFOL: SHX5813

## 2021-03-27 HISTORY — PX: BIOPSY: SHX5522

## 2021-03-27 SURGERY — ESOPHAGOGASTRODUODENOSCOPY (EGD) WITH PROPOFOL
Anesthesia: General

## 2021-03-27 MED ORDER — LIDOCAINE HCL (CARDIAC) PF 100 MG/5ML IV SOSY
PREFILLED_SYRINGE | INTRAVENOUS | Status: DC | PRN
  Administered 2021-03-27: 100 mg via INTRAVENOUS

## 2021-03-27 MED ORDER — PROPOFOL 10 MG/ML IV BOLUS
INTRAVENOUS | Status: DC | PRN
  Administered 2021-03-27: 100 mg via INTRAVENOUS
  Administered 2021-03-27: 125 ug/kg/min via INTRAVENOUS

## 2021-03-27 MED ORDER — LACTATED RINGERS IV SOLN: INTRAVENOUS | Status: DC

## 2021-03-27 NOTE — Op Note (Signed)
[facility] Patient Name: Destiny Young Procedure Date: 03/27/2021 2:35 PM MRN: [id number] Date of Birth: 1956-01-06 Attending MD: Norvel Richards , MD CSN: [id number] Age: 65 Admit Type: Outpatient Procedure:                Upper GI endoscopy Indications:              Abdominal pain in the right upper quadrant,                            Dysphagia Providers:                Norvel Richards, MD, Otis Peak B. Jacqualin Seller, RN,                            Thomas Hoff., Technician Referring MD:              Medicines:                Propofol per Anesthesia Complications:            No immediate complications. Estimated Blood Loss:     Estimated blood loss was minimal. Estimated blood                            loss was minimal. Procedure:                After obtaining informed consent, the endoscope was                            passed under direct vision. Throughout the                            procedure, the patient's blood pressure, pulse, and                            oxygen saturations were monitored continuously. The                            [id number]) was introduced through the mouth,                            and advanced to the esophageal anastomosis. Scope In: 2:58:53 PM Scope Out: 3:07:45 PM Total Procedure Duration: 0 hours 8 minutes 52 seconds  Findings:      The examined esophagus was normal aside from a tiny distal esophageal       diverticulum.      Gastric cavity empty. Evidence of fairly snug Nissen fundoplication (see       photos). Patient had 3 elliptical prepyloric antral/lesser       curvature/angularis ulcers -largest approximately 6 mm x 1.5 cm. All had       clean bases. Appeared to be benign lesions. Pylorus patent. The scope       was withdrawn. Dilation was performed with a Maloney dilator with mild       resistance at 27 Fr. The dilation site was examined following endoscope       reinsertion and showed no change. Estimated  blood loss was minimal.      The duodenal  bulb and second portion of the duodenum were normal.      Biopsies of the biopsies of the gastric ulcers taken for histologic study Impression:               - Normal esophagus. Snug fundoplication?"status post                            dilation. Multiple large gastric ulcer -status post                            biopsy                           - Normal duodenal bulb and s snugecond portion of                            the duodenum.                           - Moderate Sedation:      Moderate (conscious) sedation was personally administered by an       anesthesia professional. The following parameters were monitored: oxygen       saturation, heart rate, blood pressure, and response to care. Recommendation:           - Patient has a contact number available for                            emergencies. The signs and symptoms of potential                            delayed complications were discussed with the                            patient. Return to normal activities tomorrow.                            Written discharge instructions were provided to the                            patient.                           - Advance diet as tolerated.                           - Continue present medications. Stop all                            NSAID/aspirin products. Begin Protonix 40 mg twice                            daily. Follow-up on pathology.                           - Repeat upper endoscopy in 3 months for  surveillance.                           - Return to GI office in 6 weeks. Procedure Code(s):        --- Professional ---                           78938, Esophagoscopy, flexible, transoral;                            diagnostic, including collection of specimen(s) by                            brushing or washing, when performed (separate                            procedure)                            43450, Dilation of esophagus, by unguided sound or                            bougie, single or multiple passes Diagnosis Code(s):        --- Professional ---                           R10.11, Right upper quadrant pain                           R13.10, Dysphagia, unspecified CPT copyright 2019 American Medical Association. All rights reserved. The codes documented in this report are preliminary and upon coder review may  be revised to meet current compliance requirements. Cristopher Estimable. Keahi Mccarney, MD Norvel Richards, MD 03/27/2021 3:23:22 PM This report has been signed electronically. Number of Addenda: 0

## 2021-03-27 NOTE — Discharge Instructions (Signed)
EGD Discharge instructions Please read the instructions outlined below and refer to this sheet in the next few weeks. These discharge instructions provide you with general information on caring for yourself after you leave the hospital. Your doctor may also give you specific instructions. While your treatment has been planned according to the most current medical practices available, unavoidable complications occasionally occur. If you have any problems or questions after discharge, please call your doctor. ACTIVITY  You may resume your regular activity but move at a slower pace for the next 24 hours.   Take frequent rest periods for the next 24 hours.   Walking will help expel (get rid of) the air and reduce the bloated feeling in your abdomen.   No driving for 24 hours (because of the anesthesia (medicine) used during the test).   You may shower.   Do not sign any important legal documents or operate any machinery for 24 hours (because of the anesthesia used during the test).  NUTRITION  Drink plenty of fluids.   You may resume your normal diet.   Begin with a light meal and progress to your normal diet.   Avoid alcoholic beverages for 24 hours or as instructed by your caregiver.  MEDICATIONS  You may resume your normal medications unless your caregiver tells you otherwise.  WHAT YOU CAN EXPECT TODAY  You may experience abdominal discomfort such as a feeling of fullness or "gas" pains.  FOLLOW-UP  Your doctor will discuss the results of your test with you.  SEEK IMMEDIATE MEDICAL ATTENTION IF ANY OF THE FOLLOWING OCCUR:  Excessive nausea (feeling sick to your stomach) and/or vomiting.   Severe abdominal pain and distention (swelling).   Trouble swallowing.   Temperature over 101 F (37.8 C).   Rectal bleeding or vomiting of blood.    You have 3 large stomach ulcers likely caused by aspirin/ibuprofen  Stop aspirin/ibuprofen  Begin Protonix 40 mg twice daily  (before breakfast and supper) -Need to take nonaspirin pain reliever as prescribed by your primary care physician.  Ulcers were biopsied; further recommendations to follow pending review of pathology report  Office visit with Neil Crouch in 6 weeks  At patient request, I called Domenica Fail at [phone] voicemail-left a detailed message  At patient request, I called Thersa Salt at [phone]

## 2021-03-27 NOTE — H&P (Signed)
@LOGO @   Primary Care Physician:  Leeanne Rio, MD Primary Gastroenterologist:  Dr. Gala Romney  Pre-Procedure History & Physical: HPI:  Destiny Young is a 65 y.o. female here for further evaluation of esophageal dysphagia.  History of a Nissen fundoplication with revision previously.  Negative labs and abdominal CT.  Past Medical History:  Diagnosis Date  . Allergy   . Anxiety   . Aortic insufficiency   . Arthritis    bulging discs  . Asthma   . Bradycardia   . Burning chest pain   . Cancer (HCC)    abnormal PAP  . Depression   . Ejection fraction   . GERD (gastroesophageal reflux disease)    History of Nissen fundoplication  . Hemorrhoids   . High cholesterol   . History of Nissen fundoplication    9628  . IBS (irritable bowel syndrome)   . Nodular thyroid disease 06/14/2020  . Pelvic pain in female 12/05/2015  . Pelvic relaxation 12/05/2015  . Rectocele 12/05/2015  . Seizures ([place])    per Dr Merlene Laughter  . Skin tag 12/05/2015  . TMJ (dislocation of temporomandibular joint)   . Ventricular tachycardia ([place]) 08/21/2013   7 beats ventricular tachycardia, rate 180, 8 day event recorder, July 13, 2013  //   nuclear stress study normal, November, 2014, normal EF by 2-D and nuclear     Past Surgical History:  Procedure Laterality Date  . ABDOMINAL HYSTERECTOMY    . ABDOMINAL SURGERY     hernia repair  . CERVICAL ABLATION    . CHOLECYSTECTOMY    . COLONOSCOPY  May 2011   Dr. Gala Romney: Normal terminal ileum to 10 cm, normal colonoscopy.  . ESOPHAGOGASTRODUODENOSCOPY  04/14/2006   [id number] esophagus with esophagogastric junction at 36 cm from the incisors.  Intact Nissen fundoplication, otherwise normal  . ESOPHAGOGASTRODUODENOSCOPY  May 2011   Dr. Gala Romney: normal, status post intact Nissen Fundoplication, antral erosions and erythema with chronic gastritis but no H. pylori on biopsies.  . ESOPHAGOGASTRODUODENOSCOPY N/A 12/03/2014   Dr.Adyline Huberty- intact fundoplication,  antral erosions s/p bx. Bx= focally eroded reactive gastopathy negative for hpylori  . EXTERNAL EAR SURGERY    . HERNIA REPAIR    . NISSEN FUNDOPLICATION  6812  . NOSE SURGERY    . SHOULDER SURGERY    . TONSILLECTOMY      Prior to Admission medications   Medication Sig Start Date End Date Taking? Authorizing Provider  acetaminophen (TYLENOL) 500 MG tablet Take 1,000 mg by mouth every 6 (six) hours as needed for pain.   Yes [provider]  ARTIFICIAL TEAR SOLUTION OP Place 1 drop into both eyes daily as needed (dry eyes).   Yes [provider]  aspirin EC 81 MG tablet Take 81 mg by mouth daily. Swallow whole.   Yes [provider]  atorvastatin (LIPITOR) 10 MG tablet Take 10 mg by mouth daily.   Yes [provider]  Calcium Carb-Cholecalciferol (CALCIUM 500 + D PO) Take 1 tablet by mouth daily.   Yes [provider]  clonazePAM (KLONOPIN) 0.5 MG tablet Take 0.5 mg by mouth daily.   Yes [provider]  diphenhydrAMINE (BENADRYL) 25 MG tablet Take 25 mg by mouth daily as needed for allergies.   Yes [provider]  DULoxetine (CYMBALTA) 20 MG capsule Take 20 mg by mouth daily. 06/11/20  Yes [provider]  Fiber POWD Take 1 Scoop by mouth daily.   Yes [provider]  B[place]on [place]  PO Take 1 tablet by mouth 2 (two) times a week.   Yes [provider]  ibuprofen (ADVIL) 800 MG tablet Take 800 mg by mouth every 8 (eight) hours as needed for moderate pain.   Yes [provider]  Ketotifen Fumarate (ALLERGY EYE DROPS OP) Place 1 drop into both eyes daily as needed (allergies).   Yes [provider]  lubiprostone (AMITIZA) 24 MCG capsule Take 1 capsule (24 mcg total) by mouth 2 (two) times daily with a meal. Patient taking differently: Take 24 mcg by mouth daily as needed for constipation. 12/27/20  Yes Mahala Menghini, PA-C  Magnesium 400 MG TABS Take 400 mg by mouth daily.   Yes  [provider]  Multiple Vitamins-Minerals (ADULT GUMMY PO) Take 2 capsules by mouth daily.   Yes [provider]  oxymetazoline (AFRIN) 0.05 % nasal spray Place 1 spray into both nostrils 2 (two) times daily as needed for congestion.   Yes [provider]  PROVENTIL HFA 108 (90 Base) MCG/ACT inhaler INHALE TWO PUFFS BY MOUTH EVERY 6 HOURS AS NEEDED FOR SHORTNESS OF BREATH Patient taking differently: Inhale 2 puffs into the lungs every 6 (six) hours as needed. 09/13/17  Yes Raylene Everts, MD  terbinafine (LAMISIL) 1 % cream Apply 1 application topically daily.   Yes [provider]  tiZANidine (ZANAFLEX) 2 MG tablet Take 1 mg by mouth 2 (two) times daily as needed for muscle spasms. 07/08/20  Yes [provider]  VITAMIN A PO Take 1 capsule by mouth 3 (three) times a week.   Yes [provider]  VITAMIN E PO Take 1 capsule by mouth 3 (three) times a week.   Yes [provider]    Allergies as of 02/10/2021 - Review Complete 01/13/2021  Allergen Reaction Noted  . Demerol [meperidine]  01/08/2020  . Ciprofloxacin Other (See Comments)   . Codeine Nausea Only   . Meperidine hcl Nausea And Vomiting   . Metaxalone Nausea Only   . Naproxen Nausea Only 07/29/2012  . Neurontin [gabapentin] Other (See Comments) 12/05/2015  . Simvastatin Other (See Comments) 08/21/2013  . Sulfa antibiotics Itching   . Tizanidine Other (See Comments) 07/05/2013  . Trazodone and nefazodone Other (See Comments) 01/27/2017    Family History  Problem Relation Age of Onset  . Lung cancer Father   . Cancer Father        lung cancer  . Irritable bowel syndrome Sister   . Irritable bowel syndrome Sister   . Hyperlipidemia Sister   . Hypertension Sister   . Vascular Disease Sister   . Hyperlipidemia Sister   . Hypertension Sister   . Hyperlipidemia Mother   . GER disease Mother   . Irritable bowel syndrome Mother   . Hypertension Brother   .  Hyperlipidemia Brother   . Cancer Brother        lung  . Hypertension Son   . Hyperlipidemia Son   . Stroke Maternal Grandmother   . Diabetes Maternal Grandmother   . Hypertension Maternal Grandmother   . Hyperlipidemia Maternal Grandmother   . Hypertension Maternal Grandfather   . Heart attack Maternal Grandfather   . Diabetes Paternal Grandmother   . Hyperlipidemia Paternal Grandmother   . Hypertension Paternal Grandmother   . Kidney disease Paternal Grandmother   . Hypertension Paternal Grandfather   . Hyperlipidemia Paternal Grandfather   . Heart attack Paternal Grandfather   . Diabetes Sister   . Hypertension Son   .  Cancer Paternal Aunt        ovarian    Social History   Socioeconomic History  . Marital status: Widowed    Spouse name: Annie Main  . Number of children: 3  . Years of education: 9  . Highest education level: Not on file  Occupational History  . Not on file  Tobacco Use  . Smoking status: Never Smoker  . Smokeless tobacco: Never Used  Vaping Use  . Vaping Use: Never used  Substance and Sexual Activity  . Alcohol use: Yes    Comment: occ glass of red wine, 01-06-2017 RARELY  . Drug use: Yes    Types: Marijuana    Comment: occ. 01-06-2017 PER PT [facility]Y [facility] PAIN [facility]  . Sexual activity: Yes    Birth control/protection: Surgical    Comment: hyst  Other Topics Concern  . Not on file  Social History Narrative   Lives independently   Daughter and granddaughter live with her      Disabled- for stomach problems and orthopedic problems, nerves   Social Determinants of Health   Financial Resource Strain: Not on file  Food Insecurity: Not on file  Transportation Needs: Not on file  Physical Activity: Not on file  Stress: Not on file  Social Connections: Not on file  Intimate Partner Violence: Not on file    Review of Systems: See HPI, otherwise negative ROS  Physical Exam: BP 125/83   Pulse 68   Temp 98.9 F (37.2 C) (Oral)   Resp 13    Ht 5\' 2"  (1.575 m)   Wt 62.6 kg   SpO2 96%   BMI 25.24 kg/m  General:   Alert,  Well-developed, well-nourished, pleasant and cooperative in NAD Neck:  Supple; no masses or thyromegaly. No significant cervical adenopathy. Lungs:  Clear throughout to auscultation.   No wheezes, crackles, or rhonchi. No acute distress. Heart:  Regular rate and rhythm; no murmurs, clicks, rubs,  or gallops. Abdomen: Non-distended, normal bowel sounds.  Soft and nontender without appreciable mass or hepatosplenomegaly.  Pulses:  Normal pulses noted. Extremities:  Without clubbing or edema.  Impression/Plan: 65 year old lady with a history of GERD status post fundoplication with revision previously now with esophageal dysphagia. Here for EGD with possible esophageal dilation as feasible/appropriate per plan.  The risks, benefits, limitations, alternatives and imponderables have been reviewed with the patient. Potential for esophageal dilation, biopsy, etc. have also been reviewed.  Questions have been answered. All parties agreeable.     Notice: This dictation was prepared with Dragon dictation along with smaller phrase technology. Any transcriptional errors that result from this process are unintentional and may not be corrected upon review.

## 2021-03-27 NOTE — Anesthesia Preprocedure Evaluation (Signed)
Anesthesia Evaluation  Patient identified by MRN, date of birth, ID band Patient awake    Reviewed: Allergy & Precautions, H&P , NPO status , Patient's Chart, lab work & pertinent test results, reviewed documented beta blocker date and time   Airway Mallampati: II  TM Distance: >3 FB Neck ROM: full    Dental no notable dental hx.    Pulmonary asthma ,    Pulmonary exam normal breath sounds clear to auscultation       Cardiovascular Exercise Tolerance: Good negative cardio ROS   Rhythm:regular Rate:Normal     Neuro/Psych Seizures -, Well Controlled,  PSYCHIATRIC DISORDERS Anxiety Depression  Neuromuscular disease    GI/Hepatic Neg liver ROS, GERD  Medicated,  Endo/Other  negative endocrine ROS  Renal/GU negative Renal ROS  negative genitourinary   Musculoskeletal   Abdominal   Peds  Hematology negative hematology ROS (+)   Anesthesia Other Findings   Reproductive/Obstetrics negative OB ROS                             Anesthesia Physical Anesthesia Plan  ASA: III  Anesthesia Plan: General   Post-op Pain Management:    Induction:   PONV Risk Score and Plan: Propofol infusion  Airway Management Planned:   Additional Equipment:   Intra-op Plan:   Post-operative Plan:   Informed Consent: I have reviewed the patients History and Physical, chart, labs and discussed the procedure including the risks, benefits and alternatives for the proposed anesthesia with the patient or authorized representative who has indicated his/her understanding and acceptance.     Dental Advisory Given  Plan Discussed with: CRNA  Anesthesia Plan Comments:         Anesthesia Quick Evaluation

## 2021-03-27 NOTE — Transfer of Care (Signed)
Immediate Anesthesia Transfer of Care Note  Patient: Destiny Young  Procedure(s) Performed: ESOPHAGOGASTRODUODENOSCOPY (EGD) WITH PROPOFOL (N/A ) MALONEY DILATION (N/A ) BIOPSY  Patient Location: Endoscopy Unit  Anesthesia Type:General  Level of Consciousness: awake, alert , oriented and patient cooperative  Airway & Oxygen Therapy: Patient Spontanous Breathing  Post-op Assessment: Report given to RN, Post -op Vital signs reviewed and stable and Patient moving all extremities  Post vital signs: Reviewed and stable  Last Vitals:  Vitals Value Taken Time  BP    Temp 36.4 C 03/27/21 1511  Pulse    Resp 22 03/27/21 1511  SpO2 94 % 03/27/21 1511    Last Pain:  Vitals:   03/27/21 1511  TempSrc: Oral  PainSc: 0-No pain         Complications: No complications documented.

## 2021-03-27 NOTE — Anesthesia Postprocedure Evaluation (Signed)
Anesthesia Post Note  Patient: SABRYN PRESLAR  Procedure(s) Performed: ESOPHAGOGASTRODUODENOSCOPY (EGD) WITH PROPOFOL (N/A ) MALONEY DILATION (N/A ) BIOPSY  Patient location during evaluation: Phase II Anesthesia Type: General Level of consciousness: awake Pain management: pain level controlled Vital Signs Assessment: post-procedure vital signs reviewed and stable Respiratory status: spontaneous breathing and respiratory function stable Cardiovascular status: blood pressure returned to baseline and stable Postop Assessment: no headache and no apparent nausea or vomiting Anesthetic complications: no Comments: Late entry   No complications documented.   Last Vitals:  Vitals:   03/27/21 1357 03/27/21 1511  BP: 125/83 (!) 96/52  Pulse: 68   Resp: 13 (!) 22  Temp: 37.2 C (!) 36.4 C  SpO2: 96% 94%    Last Pain:  Vitals:   03/27/21 1511  TempSrc: Oral  PainSc: 0-No pain                 Louann Sjogren

## 2021-03-31 LAB — SURGICAL PATHOLOGY

## 2021-04-01 ENCOUNTER — Encounter: Payer: Self-pay | Admitting: Internal Medicine

## 2021-04-07 ENCOUNTER — Encounter (HOSPITAL_COMMUNITY): Payer: Self-pay | Admitting: Internal Medicine

## 2021-04-22 ENCOUNTER — Other Ambulatory Visit: Payer: Self-pay | Admitting: *Deleted

## 2021-04-22 ENCOUNTER — Telehealth: Payer: Self-pay | Admitting: *Deleted

## 2021-04-22 MED ORDER — SUCRALFATE 1 GM/10ML PO SUSP: 1.0000 g | Freq: Four times a day (QID) | ORAL | 0 refills | Status: DC

## 2021-04-22 NOTE — Telephone Encounter (Signed)
Lmom for pt to call me back. 

## 2021-04-22 NOTE — Telephone Encounter (Signed)
Spoke with pt and made her aware of recommendations.  She denies any NSAID, Alka-Seltzer, or headache powder usage.  Says she already stopped baby aspirin.  Was advised to continue Protonix 40 mg twice daily.  Pt is aware that we are sending in RX for Carafate to be taken before meals and at bedtime x 10 days.  Pt voiced understanding.

## 2021-04-22 NOTE — Telephone Encounter (Signed)
Spoke to pt.  Had EGD 03/27/2021.  Having constant gnawing and aching pain in stomach for past 3 days where her ulcers are located.  Takes Protonix 40 mg twice daily.  Said it doesn't seem to be helping.  No n/v.  Drinks coffee daily and has occasional green tea with watermelon.  Says pain with everything even when drinking water.

## 2021-04-22 NOTE — Telephone Encounter (Signed)
Reviewed records, EGD findings.  By now, ulcer symptoms should be markedly improved.  Make sure no NSAIDs(Alka-Seltzer/headache powders, etc..  For now, would even stop the baby aspirin.  Continue Protonix 40 mg twice daily..  Can add Carafate suspension 1 g before meals and at bedtime x10 days (dispense 40 no refills).  If no improvement or worsening symptoms need to consider other possibilities as a cause of pain.

## 2021-05-05 ENCOUNTER — Other Ambulatory Visit (HOSPITAL_COMMUNITY): Payer: Self-pay | Admitting: Neurology

## 2021-05-05 DIAGNOSIS — M545 Low back pain, unspecified: Secondary | ICD-10-CM

## 2021-05-30 ENCOUNTER — Ambulatory Visit: Payer: Medicare Other | Admitting: Gastroenterology

## 2021-07-15 ENCOUNTER — Ambulatory Visit: Payer: Medicare Other | Admitting: Internal Medicine

## 2021-08-05 ENCOUNTER — Other Ambulatory Visit (HOSPITAL_COMMUNITY): Payer: Self-pay | Admitting: Neurology

## 2021-08-05 ENCOUNTER — Other Ambulatory Visit: Payer: Self-pay | Admitting: Neurology

## 2021-08-05 DIAGNOSIS — I639 Cerebral infarction, unspecified: Secondary | ICD-10-CM

## 2021-08-11 ENCOUNTER — Other Ambulatory Visit (HOSPITAL_COMMUNITY): Payer: Self-pay | Admitting: Family Medicine

## 2021-08-11 DIAGNOSIS — Z1231 Encounter for screening mammogram for malignant neoplasm of breast: Secondary | ICD-10-CM

## 2021-08-18 ENCOUNTER — Ambulatory Visit (HOSPITAL_COMMUNITY)
Admission: RE | Admit: 2021-08-18 | Discharge: 2021-08-18 | Disposition: A | Discharge status: Home / Self Care | Payer: Medicare Other | Source: Ambulatory Visit | Attending: Neurology | Admitting: Neurology

## 2021-08-18 ENCOUNTER — Other Ambulatory Visit: Payer: Self-pay

## 2021-08-18 DIAGNOSIS — I639 Cerebral infarction, unspecified: Secondary | ICD-10-CM | POA: Diagnosis not present

## 2021-09-22 ENCOUNTER — Ambulatory Visit (HOSPITAL_COMMUNITY)
Admission: RE | Admit: 2021-09-22 | Discharge: 2021-09-22 | Disposition: A | Discharge status: Home / Self Care | Payer: Medicare Other | Source: Ambulatory Visit | Attending: Family Medicine | Admitting: Family Medicine

## 2021-09-22 ENCOUNTER — Other Ambulatory Visit: Payer: Self-pay

## 2021-09-22 ENCOUNTER — Other Ambulatory Visit (HOSPITAL_COMMUNITY): Payer: Self-pay | Admitting: Neurology

## 2021-09-22 DIAGNOSIS — Z1231 Encounter for screening mammogram for malignant neoplasm of breast: Secondary | ICD-10-CM | POA: Diagnosis present

## 2021-09-30 ENCOUNTER — Other Ambulatory Visit (HOSPITAL_COMMUNITY): Payer: Self-pay | Admitting: Family Medicine

## 2021-09-30 DIAGNOSIS — R928 Other abnormal and inconclusive findings on diagnostic imaging of breast: Secondary | ICD-10-CM

## 2021-10-08 ENCOUNTER — Inpatient Hospital Stay (HOSPITAL_COMMUNITY): Admission: RE | Admit: 2021-10-08 | Payer: Medicare Other | Source: Ambulatory Visit

## 2021-10-08 ENCOUNTER — Ambulatory Visit (HOSPITAL_COMMUNITY): Payer: Medicare Other

## 2021-10-29 LAB — COLOGUARD: COLOGUARD: NEGATIVE

## 2021-11-04 ENCOUNTER — Ambulatory Visit (HOSPITAL_COMMUNITY)
Admission: RE | Admit: 2021-11-04 | Discharge: 2021-11-04 | Disposition: A | Discharge status: Home / Self Care | Payer: Medicare Other | Source: Ambulatory Visit | Attending: Neurology | Admitting: Neurology

## 2021-11-04 ENCOUNTER — Ambulatory Visit (HOSPITAL_COMMUNITY)
Admission: RE | Admit: 2021-11-04 | Discharge: 2021-11-04 | Disposition: A | Discharge status: Home / Self Care | Payer: Medicare Other | Source: Ambulatory Visit | Attending: Family Medicine | Admitting: Family Medicine

## 2021-11-04 ENCOUNTER — Other Ambulatory Visit: Payer: Self-pay

## 2021-11-04 DIAGNOSIS — M545 Low back pain, unspecified: Secondary | ICD-10-CM

## 2021-11-04 DIAGNOSIS — R928 Other abnormal and inconclusive findings on diagnostic imaging of breast: Secondary | ICD-10-CM

## 2021-11-04 DIAGNOSIS — M8589 Other specified disorders of bone density and structure, multiple sites: Secondary | ICD-10-CM | POA: Insufficient documentation

## 2021-11-04 DIAGNOSIS — M1711 Unilateral primary osteoarthritis, right knee: Secondary | ICD-10-CM | POA: Diagnosis not present

## 2021-11-04 DIAGNOSIS — M47816 Spondylosis without myelopathy or radiculopathy, lumbar region: Secondary | ICD-10-CM | POA: Diagnosis not present

## 2021-11-04 DIAGNOSIS — R921 Mammographic calcification found on diagnostic imaging of breast: Secondary | ICD-10-CM | POA: Diagnosis not present

## 2021-11-18 ENCOUNTER — Ambulatory Visit: Payer: Commercial Managed Care - HMO | Admitting: Adult Health

## 2021-12-30 ENCOUNTER — Other Ambulatory Visit (HOSPITAL_COMMUNITY): Payer: Self-pay | Admitting: Neurosurgery

## 2021-12-30 ENCOUNTER — Other Ambulatory Visit: Payer: Self-pay | Admitting: Neurosurgery

## 2021-12-30 DIAGNOSIS — M4722 Other spondylosis with radiculopathy, cervical region: Secondary | ICD-10-CM

## 2021-12-30 DIAGNOSIS — M5440 Lumbago with sciatica, unspecified side: Secondary | ICD-10-CM

## 2022-01-12 ENCOUNTER — Other Ambulatory Visit: Payer: Self-pay

## 2022-01-12 ENCOUNTER — Encounter: Payer: Self-pay | Admitting: Gastroenterology

## 2022-01-12 ENCOUNTER — Telehealth: Payer: Self-pay | Admitting: *Deleted

## 2022-01-12 ENCOUNTER — Ambulatory Visit: Payer: Medicare Other | Admitting: Gastroenterology

## 2022-01-12 ENCOUNTER — Ambulatory Visit (INDEPENDENT_AMBULATORY_CARE_PROVIDER_SITE_OTHER): Payer: Medicare Other | Admitting: Gastroenterology

## 2022-01-12 VITALS — BP 150/76 | HR 82 | Temp 97.3°F | Ht 62.5 in | Wt 145.8 lb

## 2022-01-12 DIAGNOSIS — K259 Gastric ulcer, unspecified as acute or chronic, without hemorrhage or perforation: Secondary | ICD-10-CM

## 2022-01-12 DIAGNOSIS — R1319 Other dysphagia: Secondary | ICD-10-CM

## 2022-01-12 DIAGNOSIS — K5900 Constipation, unspecified: Secondary | ICD-10-CM

## 2022-01-12 DIAGNOSIS — K219 Gastro-esophageal reflux disease without esophagitis: Secondary | ICD-10-CM | POA: Diagnosis not present

## 2022-01-12 MED ORDER — LUBIPROSTONE 8 MCG PO CAPS: ORAL_CAPSULE | ORAL | 5 refills | Status: DC

## 2022-01-12 NOTE — Patient Instructions (Addendum)
Continue fiber daily. ?Increase pantoprazole to '40mg'$  twice daily before breakfast and evening meal. ?Try amitiza 46mg once or twice daily with food for constipation. ?Upper endoscopy to be scheduled. See separate instruction.  ?Please let me know if no improvement in your constipation, lower abdominal pain.  ?

## 2022-01-12 NOTE — Telephone Encounter (Signed)
Called pt, LMOVM to call back to schedule EGD +/-ed with Dr. Gala Romney, ASA 3 ?

## 2022-01-12 NOTE — Progress Notes (Signed)
? ? ? ?GI Office Note   ? ?Referring Provider: Leeanne Rio, MD ?Primary Care Physician:  Leeanne Rio, MD  ?Primary Gastroenterologist: Garfield Cornea, MD ? ?Chief Complaint  ? ?Chief Complaint  ?Patient presents with  ? Follow-up  ?  Acid reflux issues  ? ? ?History of Present Illness  ? ?Destiny Young is a 66 y.o. female presenting for follow-up.  Last seen in the office in March 2022.  History of remote fundoplication in 2426, converted to Toupet fundoplication in 8341 at time of cholecystectomy, also with ventral hernia repair at that time.  At time of last office visit she was complaining of right-sided abdominal pain, nausea, dysphagia, constipation.  CT abdomen pelvis, March 2022, with contrast was unremarkable.  EGD completed in June 2022 as outlined below. ? ?She is having a lot of heartburn. Even with water. States after her last fundoplication her heartburn went away. She is having increased reflux issues. She is worried her wrap may have come loose again. States it was hard to detect before when it came loose.  Complains of feeling like food continues to stick in her throat area.  Review of note by her surgeon Dr. Alvan Dame in 2018, he planned for laparoscopic revision of Nissen to Toupet fundoplication because of history of chronic gas bloat symptoms. ? ?She complains of constant lower abdominal pain.  BM every 3 days.  No melena or rectal bleeding.  Previously tried Actuary. Noted if took regularly, she had to live on toilet, even if only took once per day.  Use Linzess a long time ago, "had some problems" with it but she cannot remember quite what.  Denies being allergic.  Has used MiraLAX and milk of magnesia at x2.  Currently she is taking fiber powder every day..  ?  ?No longer doing ibuprofen. ? ?EGD June 2022: ?-Normal esophagus. Snug fundoplication?status post dilation. Multiple large gastric ulcer - ?status post biopsy ?-Normal duodenal bulb and s snugecond portion of the  duodenum ?-Gastric biopsy showed mild chronic gastritis with ulceration.  No H. pylori.  No intestinal metaplasia. ?-Recommended 75-monthsurveillance EGD ?-It was felt this was likely due to NSAIDs use. ? ?EGD 2016 with intact fundoplication and non H.pylori gastric erosions.  ?GES (2 hour) 2017 normal with 4.8% retention at 120 minutes.   ?MRCP 2016 with gallstones but no choledocholithiasis. ?Colonoscopy 2011 normal colon and TI. ?Cologuard December 2022: Negative ? ? ?Medications  ? ?Current Outpatient Medications  ?Medication Sig Dispense Refill  ? acetaminophen (TYLENOL) 500 MG tablet Take 1,000 mg by mouth every 6 (six) hours as needed for pain.    ? ARTIFICIAL TEAR SOLUTION OP Place 1 drop into both eyes daily as needed (dry eyes).    ? atorvastatin (LIPITOR) 10 MG tablet Take 10 mg by mouth daily.    ? Calcium Carb-Cholecalciferol (CALCIUM 500 + D PO) Take 1 tablet by mouth daily.    ? clonazePAM (KLONOPIN) 0.5 MG tablet Take 0.5 mg by mouth daily.    ? diphenhydrAMINE (BENADRYL) 25 MG tablet Take 25 mg by mouth daily as needed for allergies.    ? DULoxetine (CYMBALTA) 20 MG capsule Take 20 mg by mouth daily.    ? Fiber POWD Take 1 Scoop by mouth daily.    ? Ketotifen Fumarate (ALLERGY EYE DROPS OP) Place 1 drop into both eyes daily as needed (allergies).    ? Magnesium 400 MG TABS Take 400 mg by mouth daily.    ?  Multiple Vitamins-Minerals (ADULT GUMMY PO) Take 2 capsules by mouth daily.    ? oxymetazoline (AFRIN) 0.05 % nasal spray Place 1 spray into both nostrils 2 (two) times daily as needed for congestion.    ? PROVENTIL HFA 108 (90 Base) MCG/ACT inhaler INHALE TWO PUFFS BY MOUTH EVERY 6 HOURS AS NEEDED FOR SHORTNESS OF BREATH (Patient taking differently: Inhale 2 puffs into the lungs every 6 (six) hours as needed.) 18 g 1  ? tiZANidine (ZANAFLEX) 2 MG tablet Take 1 mg by mouth 2 (two) times daily as needed for muscle spasms.    ? VITAMIN A PO Take 1 capsule by mouth 3 (three) times a week.    ? VITAMIN  E PO Take 1 capsule by mouth 3 (three) times a week.    ? pantoprazole (PROTONIX) 40 MG tablet Take 40 mg by mouth 2 (two) times daily. Takes one a day, second dose only if needed.    ? ?Current Facility-Administered Medications  ?Medication Dose Route Frequency Provider Last Rate Last Admin  ? betamethasone acetate-betamethasone sodium phosphate (CELESTONE) injection 3 mg  3 mg Intramuscular Once Edrick Kins, DPM      ? ? ?Allergies  ? ?Allergies as of 01/12/2022 - Review Complete 01/12/2022  ?Allergen Reaction Noted  ? Gluten meal Anaphylaxis and Shortness Of Breath 03/25/2021  ? Lactose intolerance (gi)  03/25/2021  ? Ciprofloxacin Other (See Comments)   ? Codeine Nausea Only   ? Meperidine hcl Nausea And Vomiting   ? Metaxalone Nausea Only   ? Naproxen Nausea Only 07/29/2012  ? Neurontin [gabapentin] Other (See Comments) 12/05/2015  ? Simvastatin Other (See Comments) 08/21/2013  ? Sulfa antibiotics Itching   ? Tizanidine Other (See Comments) 07/05/2013  ? Trazodone and nefazodone Other (See Comments) 01/27/2017  ? ? ?Past Medical History  ? ?Past Medical History:  ?Diagnosis Date  ? Allergy   ? Anxiety   ? Aortic insufficiency   ? Arthritis   ? bulging discs  ? Asthma   ? Bradycardia   ? Burning chest pain   ? Cancer [facility])   ? abnormal PAP  ? Depression   ? Ejection fraction   ? GERD (gastroesophageal reflux disease)   ? History of Nissen fundoplication  ? Hemorrhoids   ? High cholesterol   ? History of Nissen fundoplication   ? 2000  ? IBS (irritable bowel syndrome)   ? Nodular thyroid disease 06/14/2020  ? Pelvic pain in female 12/05/2015  ? Pelvic relaxation 12/05/2015  ? Rectocele 12/05/2015  ? Seizures ([place])   ? per Dr Merlene Laughter  ? Skin tag 12/05/2015  ? TMJ (dislocation of temporomandibular joint)   ? Ventricular tachycardia 08/21/2013  ? 7 beats ventricular tachycardia, rate 180, 8 day event recorder, July 13, 2013  //   nuclear stress study normal, November, 2014, normal EF by 2-D and nuclear   ? ? ?Past  Surgical History  ? ?Past Surgical History:  ?Procedure Laterality Date  ? ABDOMINAL HYSTERECTOMY    ? ABDOMINAL SURGERY    ? hernia repair  ? BIOPSY  03/27/2021  ? Procedure: BIOPSY;  Surgeon: Daneil Dolin, MD;  Location: [facility];  Service: Endoscopy;;  ? CERVICAL ABLATION    ? CHOLECYSTECTOMY    ? COLONOSCOPY  May 2011  ? Dr. Gala Romney: Normal terminal ileum to 10 cm, normal colonoscopy.  ? ESOPHAGOGASTRODUODENOSCOPY  04/14/2006  ? [id number] esophagus with esophagogastric junction at 36 cm from the incisors.  Intact Nissen fundoplication,  otherwise normal  ? ESOPHAGOGASTRODUODENOSCOPY  May 2011  ? Dr. Gala Romney: normal, status post intact Nissen Fundoplication, antral erosions and erythema with chronic gastritis but no H. pylori on biopsies.  ? ESOPHAGOGASTRODUODENOSCOPY N/A 12/03/2014  ? Dr.Rourk- intact fundoplication, antral erosions s/p bx. Bx= focally eroded reactive gastopathy negative for hpylori  ? ESOPHAGOGASTRODUODENOSCOPY (EGD) WITH PROPOFOL N/A 03/27/2021  ? Procedure: ESOPHAGOGASTRODUODENOSCOPY (EGD) WITH PROPOFOL;  Surgeon: Daneil Dolin, MD;  Location: [facility];  Service: Endoscopy;  Laterality: N/A;  Patient cannot come in any earlier.  ? EXTERNAL EAR SURGERY    ? HERNIA REPAIR    ? MALONEY DILATION N/A 03/27/2021  ? Procedure: MALONEY DILATION;  Surgeon: Daneil Dolin, MD;  Location: [facility];  Service: Endoscopy;  Laterality: N/A;  ? NISSEN FUNDOPLICATION  7416  ? NOSE SURGERY    ? SHOULDER SURGERY    ? TONSILLECTOMY    ? ? ?Past Family History  ? ?Family History  ?Problem Relation Age of Onset  ? Lung cancer Father   ? Cancer Father   ?     lung cancer  ? Irritable bowel syndrome Sister   ? Irritable bowel syndrome Sister   ? Hyperlipidemia Sister   ? Hypertension Sister   ? Vascular Disease Sister   ? Hyperlipidemia Sister   ? Hypertension Sister   ? Hyperlipidemia Mother   ? GER disease Mother   ? Irritable bowel syndrome Mother   ? Hypertension Brother   ? Hyperlipidemia  Brother   ? Cancer Brother   ?     lung  ? Hypertension Son   ? Hyperlipidemia Son   ? Stroke Maternal Grandmother   ? Diabetes Maternal Grandmother   ? Hypertension Maternal Grandmother   ? Hyperlipidemia Maternal

## 2022-01-13 ENCOUNTER — Encounter: Payer: Self-pay | Admitting: *Deleted

## 2022-01-13 NOTE — Telephone Encounter (Signed)
Spoke w/ pt. Scheduled for 4/27. Aware will mail prep instructions with pre-op appt. Confirmed address. ? ?PA done via [facility]. Auth# [id number], DOS: Feb 19, 2022 - May 20, 2022 ?

## 2022-01-16 ENCOUNTER — Other Ambulatory Visit: Payer: Self-pay

## 2022-01-16 ENCOUNTER — Ambulatory Visit (HOSPITAL_COMMUNITY)
Admission: RE | Admit: 2022-01-16 | Discharge: 2022-01-16 | Disposition: A | Discharge status: Home / Self Care | Payer: Medicare Other | Source: Ambulatory Visit | Attending: Neurosurgery | Admitting: Neurosurgery

## 2022-01-16 DIAGNOSIS — M5440 Lumbago with sciatica, unspecified side: Secondary | ICD-10-CM | POA: Diagnosis present

## 2022-01-16 DIAGNOSIS — M4722 Other spondylosis with radiculopathy, cervical region: Secondary | ICD-10-CM | POA: Insufficient documentation

## 2022-02-13 NOTE — Patient Instructions (Signed)
? ? ? ? ? ? ? Destiny Young ? 02/13/2022  ?  ? '@PREFPERIOPPHARMACY'$ @ ? ? Your procedure is scheduled on  02/19/2022. ? ? Report to [facility] at  1315 (1:15) P.M. ? ? Call this number if you have problems the morning of surgery: ? [phone] ? ? Remember: ? Follow the diet instructions given to you by the office. ? ?  Use your inhaler before you come and bring your rescue inhaler with you. ?  ? Take these medicines the morning of surgery with A SIP OF WATER ? ?                      cymbalta, protonix, zanaflex(if needed). ?  ? ? Do not wear jewelry, make-up or nail polish. ? Do not wear lotions, powders, or perfumes, or deodorant. ? Do not shave 48 hours prior to surgery.  Men may shave face and neck. ? Do not bring valuables to the hospital. ? [facility] is not responsible for any belongings or valuables. ? ?Contacts, dentures or bridgework may not be worn into surgery.  Leave your suitcase in the car.  After surgery it may be brought to your room. ? ?For patients admitted to the hospital, discharge time will be determined by your treatment team. ? ?Patients discharged the day of surgery will not be allowed to drive home and must have someone with them for 24 hours.  ? ? ?Special instructions:   DO NOT smoke tobacco or vape for 24 hours before your procedure. ? ?Please read over the following fact sheets that you were given. ?Anesthesia Post-op Instructions and Care and Recovery After Surgery ?  ? ? ? Upper Endoscopy, Adult, Care After ?This sheet gives you information about how to care for yourself after your procedure. Your health care provider may also give you more specific instructions. If you have problems or questions, contact your health care provider. ?What can I expect after the procedure? ?After the procedure, it is common to have: ?A sore throat. ?Mild stomach pain or discomfort. ?Bloating. ?Nausea. ?Follow these instructions at home: ? ?Follow instructions from your health care provider about what  to eat or drink after your procedure. ?Return to your normal activities as told by your health care provider. Ask your health care provider what activities are safe for you. ?Take over-the-counter and prescription medicines only as told by your health care provider. ?If you were given a sedative during the procedure, it can affect you for several hours. Do not drive or operate machinery until your health care provider says that it is safe. ?Keep all follow-up visits as told by your health care provider. This is important. ?Contact a health care provider if you have: ?A sore throat that lasts longer than one day. ?Trouble swallowing. ?Get help right away if: ?You vomit blood or your vomit looks like coffee grounds. ?You have: ?A fever. ?Bloody, black, or tarry stools. ?A severe sore throat or you cannot swallow. ?Difficulty breathing. ?Severe pain in your chest or abdomen. ?Summary ?After the procedure, it is common to have a sore throat, mild stomach discomfort, bloating, and nausea. ?If you were given a sedative during the procedure, it can affect you for several hours. Do not drive or operate machinery until your health care provider says that it is safe. ?Follow instructions from your health care provider about what to eat or drink after your procedure. ?Return to your normal activities as told by your health  care provider. ?This information is not intended to replace advice given to you by your health care provider. Make sure you discuss any questions you have with your health care provider. ?Document Revised: 08/18/2019 Document Reviewed: 03/14/2018 ?Elsevier Patient Education ? [place]. ?Esophageal Dilatation ?Esophageal dilatation, also called esophageal dilation, is a procedure to widen or open a blocked or narrowed part of the esophagus. The esophagus is the part of the body that moves food and liquid from the mouth to the stomach. You may need this procedure if: ?You have a buildup of scar tissue  in your esophagus that makes it difficult, painful, or impossible to swallow. This can be caused by gastroesophageal reflux disease (GERD). ?You have cancer of the esophagus. ?There is a problem with how food moves through your esophagus. ?In some cases, you may need this procedure repeated at a later time to dilate the esophagus gradually. ?Tell a health care provider about: ?Any allergies you have. ?All medicines you are taking, including vitamins, herbs, eye drops, creams, and over-the-counter medicines. ?Any problems you or family members have had with anesthetic medicines. ?Any blood disorders you have. ?Any surgeries you have had. ?Any medical conditions you have. ?Any antibiotic medicines you are required to take before dental procedures. ?Whether you are pregnant or may be pregnant. ?What are the risks? ?Generally, this is a safe procedure. However, problems may occur, including: ?Bleeding due to a tear in the lining of the esophagus. ?A hole, or perforation, in the esophagus. ?What happens before the procedure? ?Ask your health care provider about: ?Changing or stopping your regular medicines. This is especially important if you are taking diabetes medicines or blood thinners. ?Taking medicines such as aspirin and ibuprofen. These medicines can thin your blood. Do not take these medicines unless your health care provider tells you to take them. ?Taking over-the-counter medicines, vitamins, herbs, and supplements. ?Follow instructions from your health care provider about eating or drinking restrictions. ?Plan to have a responsible adult take you home from the hospital or clinic. ?Plan to have a responsible adult care for you for the time you are told after you leave the hospital or clinic. This is important. ?What happens during the procedure? ?You may be given a medicine to help you relax (sedative). ?A numbing medicine may be sprayed into the back of your throat, or you may gargle the medicine. ?Your  health care provider may perform the dilatation using various surgical instruments, such as: ?Simple dilators. This instrument is carefully placed in the esophagus to stretch it. ?Guided wire bougies. This involves using an endoscope to insert a wire into the esophagus. A dilator is passed over this wire to enlarge the esophagus. Then the wire is removed. ?Balloon dilators. An endoscope with a small balloon is inserted into the esophagus. The balloon is inflated to stretch the esophagus and open it up. ?The procedure may vary among health care providers and hospitals. ?What can I expect after the procedure? ?Your blood pressure, heart rate, breathing rate, and blood oxygen level will be monitored until you leave the hospital or clinic. ?Your throat may feel slightly sore and numb. This will get better over time. ?You will not be allowed to eat or drink until your throat is no longer numb. ?When you are able to drink, urinate, and sit on the edge of the bed without nausea or dizziness, you may be able to return home. ?Follow these instructions at home: ?Take over-the-counter and prescription medicines only as told by  your health care provider. ?If you were given a sedative during the procedure, it can affect you for several hours. Do not drive or operate machinery until your health care provider says that it is safe. ?Plan to have a responsible adult care for you for the time you are told. This is important. ?Follow instructions from your health care provider about any eating or drinking restrictions. ?Do not use any products that contain nicotine or tobacco, such as cigarettes, e-cigarettes, and chewing tobacco. If you need help quitting, ask your health care provider. ?Keep all follow-up visits. This is important. ?Contact a health care provider if: ?You have a fever. ?You have pain that is not relieved by medicine. ?Get help right away if: ?You have chest pain. ?You have trouble breathing. ?You have trouble  swallowing. ?You vomit blood. ?You have black, tarry, or bloody stools. ?These symptoms may represent a serious problem that is an emergency. Do not wait to see if the symptoms will go away. Get medical help ri

## 2022-02-16 ENCOUNTER — Encounter (HOSPITAL_COMMUNITY): Payer: Self-pay

## 2022-02-16 ENCOUNTER — Encounter (HOSPITAL_COMMUNITY)
Admission: RE | Admit: 2022-02-16 | Discharge: 2022-02-16 | Disposition: A | Discharge status: Home / Self Care | Payer: Medicare Other | Source: Ambulatory Visit | Attending: Internal Medicine | Admitting: Internal Medicine

## 2022-02-19 ENCOUNTER — Encounter (HOSPITAL_COMMUNITY): Payer: Self-pay | Admitting: Internal Medicine

## 2022-02-19 ENCOUNTER — Ambulatory Visit (HOSPITAL_COMMUNITY)
Admission: RE | Admit: 2022-02-19 | Discharge: 2022-02-19 | Disposition: A | Discharge status: Home / Self Care | Payer: Medicare Other | Attending: Internal Medicine | Admitting: Internal Medicine

## 2022-02-19 ENCOUNTER — Encounter (HOSPITAL_COMMUNITY)
Admission: RE | Disposition: A | Discharge status: Home / Self Care | Payer: Self-pay | Source: Home / Self Care | Attending: Internal Medicine

## 2022-02-19 ENCOUNTER — Ambulatory Visit (HOSPITAL_COMMUNITY): Payer: Medicare Other | Admitting: Anesthesiology

## 2022-02-19 ENCOUNTER — Ambulatory Visit (HOSPITAL_BASED_OUTPATIENT_CLINIC_OR_DEPARTMENT_OTHER): Payer: Medicare Other | Admitting: Anesthesiology

## 2022-02-19 DIAGNOSIS — K219 Gastro-esophageal reflux disease without esophagitis: Secondary | ICD-10-CM | POA: Insufficient documentation

## 2022-02-19 DIAGNOSIS — M1990 Unspecified osteoarthritis, unspecified site: Secondary | ICD-10-CM

## 2022-02-19 DIAGNOSIS — I472 Ventricular tachycardia, unspecified: Secondary | ICD-10-CM

## 2022-02-19 DIAGNOSIS — Z8711 Personal history of peptic ulcer disease: Secondary | ICD-10-CM | POA: Diagnosis not present

## 2022-02-19 DIAGNOSIS — Q396 Congenital diverticulum of esophagus: Secondary | ICD-10-CM | POA: Diagnosis not present

## 2022-02-19 DIAGNOSIS — G40909 Epilepsy, unspecified, not intractable, without status epilepticus: Secondary | ICD-10-CM | POA: Insufficient documentation

## 2022-02-19 DIAGNOSIS — F418 Other specified anxiety disorders: Secondary | ICD-10-CM | POA: Diagnosis not present

## 2022-02-19 DIAGNOSIS — K253 Acute gastric ulcer without hemorrhage or perforation: Secondary | ICD-10-CM

## 2022-02-19 HISTORY — PX: ESOPHAGOGASTRODUODENOSCOPY (EGD) WITH PROPOFOL: SHX5813

## 2022-02-19 SURGERY — ESOPHAGOGASTRODUODENOSCOPY (EGD) WITH PROPOFOL
Anesthesia: General

## 2022-02-19 MED ORDER — PROPOFOL 10 MG/ML IV BOLUS
INTRAVENOUS | Status: DC | PRN
Start: 2022-02-19 — End: 2022-02-19
  Administered 2022-02-19: 120 mg via INTRAVENOUS

## 2022-02-19 MED ORDER — LACTATED RINGERS IV SOLN: INTRAVENOUS | Status: DC

## 2022-02-19 MED ORDER — LIDOCAINE HCL (CARDIAC) PF 50 MG/5ML IV SOSY
PREFILLED_SYRINGE | INTRAVENOUS | Status: DC | PRN
  Administered 2022-02-19: 50 mg via INTRAVENOUS

## 2022-02-19 NOTE — H&P (Signed)
?$'@LOGO'j$ @ ? ? ?Primary Care Physician:  Leeanne Rio, MD ?Primary Gastroenterologist:  Dr. Gala Romney ? ?Pre-Procedure History & Physical: ?HPI:  Destiny Young is a 66 y.o. female here for   For further evaluation of history of multiple gastric ulcers seen last year.  Biopsies negative for malignancy/H. pylori.  Felt to be NSAID related.  Vague intermittent esophageal dysphagia.  History of antireflux procedure previously.  Subsequent surgical loosening.9  Small diverticulum seen on prior EGD a 94 French Maloney dilator passed last year. ? ?  Today, she denies dysphagia.  Does have significant reflux symptoms.  Persistent feeling there is a lump in her throat.  Complains of not being able to belch. ? ?Past Medical History:  ?Diagnosis Date  ? Allergy   ? Anxiety   ? Aortic insufficiency   ? Arthritis   ? bulging discs  ? Asthma   ? Bradycardia   ? Burning chest pain   ? Cancer [facility])   ? abnormal PAP  ? Depression   ? Ejection fraction   ? GERD (gastroesophageal reflux disease)   ? History of Nissen fundoplication  ? Hemorrhoids   ? High cholesterol   ? History of Nissen fundoplication   ? 2000  ? IBS (irritable bowel syndrome)   ? Nodular thyroid disease 06/14/2020  ? Pelvic pain in female 12/05/2015  ? Pelvic relaxation 12/05/2015  ? Rectocele 12/05/2015  ? Seizures ([place])   ? per Dr Merlene Laughter  ? Skin tag 12/05/2015  ? TMJ (dislocation of temporomandibular joint)   ? Ventricular tachycardia ([place]) 08/21/2013  ? 7 beats ventricular tachycardia, rate 180, 8 day event recorder, July 13, 2013  //   nuclear stress study normal, November, 2014, normal EF by 2-D and nuclear   ? ? ?Past Surgical History:  ?Procedure Laterality Date  ? ABDOMINAL HYSTERECTOMY    ? ABDOMINAL SURGERY    ? hernia repair  ? BIOPSY  03/27/2021  ? Procedure: BIOPSY;  Surgeon: Daneil Dolin, MD;  [facility];  Service: Endoscopy;;  ? CERVICAL ABLATION    ? CHOLECYSTECTOMY    ? COLONOSCOPY  02/2010  ? Dr. Gala Romney: Normal terminal  ileum to 10 cm, normal colonoscopy.  ? ESOPHAGOGASTRODUODENOSCOPY  04/14/2006  ? [id number]:[id number] esophagus with esophagogastric junction at 36 cm from the incisors.  Intact Nissen fundoplication, otherwise normal  ? ESOPHAGOGASTRODUODENOSCOPY  02/2010  ? Dr. Gala Romney: normal, status post intact Nissen Fundoplication, antral erosions and erythema with chronic gastritis but no H. pylori on biopsies.  ? ESOPHAGOGASTRODUODENOSCOPY N/A 12/03/2014  ? Dr.Lavarius Doughten- intact fundoplication, antral erosions s/p bx. Bx= focally eroded reactive gastopathy negative for hpylori  ? ESOPHAGOGASTRODUODENOSCOPY (EGD) WITH PROPOFOL N/A 03/27/2021  ? Procedure: ESOPHAGOGASTRODUODENOSCOPY (EGD) WITH PROPOFOL;  Surgeon: Daneil Dolin, MD;  [facility];  Service: Endoscopy;  Laterality: N/A;  Patient cannot come in any earlier.  ? EXTERNAL EAR SURGERY    ? HERNIA REPAIR    ? MALONEY DILATION N/A 03/27/2021  ? Procedure: MALONEY DILATION;  Surgeon: Daneil Dolin, MD;  Location[facility];  Service: Endoscopy;  Laterality: N/A;  ? NISSEN FUNDOPLICATION  5852  ? NISSEN FUNDOPLICATION    ? NOSE SURGERY    ? SHOULDER SURGERY    ? TONSILLECTOMY    ? ? ?Prior to Admission medications   ?Medication Sig Start Date End Date Taking? Authorizing Provider  ?acetaminophen (TYLENOL) 500 MG tablet Take 1,000 mg by mouth every 6 (six) hours as needed for pain.  Yes [provider]  ?ARTIFICIAL TEAR SOLUTION OP Place 1 drop into both eyes daily as needed (dry eyes).   Yes [provider]  ?aspirin EC 81 MG tablet Take 81 mg by mouth daily. Swallow whole.   Yes [provider]  ?atorvastatin (LIPITOR) 10 MG tablet Take 10 mg by mouth daily.   Yes [provider]  ?clonazePAM (KLONOPIN) 1 MG tablet Take 1 mg by mouth at bedtime.   Yes [provider]  ?diphenhydrAMINE (BENADRYL) 25 MG tablet Take 25 mg by mouth daily as needed for allergies.   Yes [provider]  ?DULoxetine (CYMBALTA)  20 MG capsule Take 20 mg by mouth daily. 06/11/20  Yes [provider]  ?Fiber POWD Take 1 Scoop by mouth daily.   Yes [provider]  ?lubiprostone (AMITIZA) 8 MCG capsule Take one capsule once to twice daily with food for constipation. ?Patient taking differently: Take 8 mcg by mouth daily. 01/12/22  Yes Mahala Menghini, PA-C  ?Multiple Minerals-Vitamins (CALCIUM-MAGNESIUM-ZINC-D3) TABS Take 1 tablet by mouth daily.   Yes [provider]  ?Multiple Vitamins-Minerals (ADULT GUMMY PO) Take 2 capsules by mouth daily.   Yes [provider]  ?multivitamin-lutein (OCUVITE-LUTEIN) CAPS capsule Take 1 capsule by mouth daily.   Yes [provider]  ?oxymetazoline (AFRIN) 0.05 % nasal spray Place 1 spray into both nostrils 2 (two) times daily as needed for congestion.   Yes [provider]  ?pantoprazole (PROTONIX) 40 MG tablet Take 40 mg by mouth at bedtime. Takes one a day, second dose only if needed. 12/24/21  Yes [provider]  ?PROVENTIL HFA 108 (90 Base) MCG/ACT inhaler INHALE TWO PUFFS BY MOUTH EVERY 6 HOURS AS NEEDED FOR SHORTNESS OF BREATH ?Patient taking differently: Inhale 2 puffs into the lungs every 6 (six) hours as needed. 09/13/17  Yes Raylene Everts, MD  ?tiZANidine (ZANAFLEX) 2 MG tablet Take 1 mg by mouth 2 (two) times daily as needed for muscle spasms. 07/08/20  Yes [provider]  ? ? ?Allergies as of 01/13/2022 - Review Complete 01/12/2022  ?Allergen Reaction Noted  ? Gluten meal Anaphylaxis and Shortness Of Breath 03/25/2021  ? Lactose intolerance (gi)  03/25/2021  ? Ciprofloxacin Other (See Comments)   ? Codeine Nausea Only   ? Meperidine hcl Nausea And Vomiting   ? Metaxalone Nausea Only   ? Naproxen Nausea Only 07/29/2012  ? Neurontin [gabapentin] Other (See Comments) 12/05/2015  ? Simvastatin Other (See Comments) 08/21/2013  ? Sulfa antibiotics Itching   ? Tizanidine Other (See Comments) 07/05/2013  ? Trazodone and  nefazodone Other (See Comments) 01/27/2017  ? ? ?Family History  ?Problem Relation Age of Onset  ? Lung cancer Father   ? Cancer Father   ?     lung cancer  ? Irritable bowel syndrome Sister   ? Irritable bowel syndrome Sister   ? Hyperlipidemia Sister   ? Hypertension Sister   ? Vascular Disease Sister   ? Hyperlipidemia Sister   ? Hypertension Sister   ? Hyperlipidemia Mother   ? GER disease Mother   ? Irritable bowel syndrome Mother   ? Hypertension Brother   ? Hyperlipidemia Brother   ? Cancer Brother   ?     lung  ? Hypertension Son   ? Hyperlipidemia Son   ? Stroke Maternal Grandmother   ? Diabetes Maternal Grandmother   ? Hypertension Maternal Grandmother   ? Hyperlipidemia Maternal Grandmother   ? Hypertension Maternal  Grandfather   ? Heart attack Maternal Grandfather   ? Diabetes Paternal Grandmother   ? Hyperlipidemia Paternal Grandmother   ? Hypertension Paternal Grandmother   ? Kidney disease Paternal Grandmother   ? Hypertension Paternal Grandfather   ? Hyperlipidemia Paternal Grandfather   ? Heart attack Paternal Grandfather   ? Diabetes Sister   ? Hypertension Son   ? Cancer Paternal Aunt   ?     ovarian  ? ? ?Social History  ? ?Socioeconomic History  ? Marital status: Widowed  ?  Spouse name: Annie Main  ? Number of children: 3  ? Years of education: 13  ? Highest education level: Not on file  ?Occupational History  ? Not on file  ?Tobacco Use  ? Smoking status: Never  ? Smokeless tobacco: Never  ?Vaping Use  ? Vaping Use: Never used  ?Substance and Sexual Activity  ? Alcohol use: Yes  ?  Comment: occ glass of red wine, 01-06-2017 RARELY  ? Drug use: Yes  ?  Types: Marijuana  ?  Comment: occ. 01-06-2017 PER PT [facility]Y [facility]IN IN [facility]  ? Sexual activity: Yes  ?  Birth control/protection: Surgical  ?  Comment: hyst  ?Other Topics Concern  ? Not on file  ?Social History Narrative  ? Lives independently  ? Daughter and granddaughter live with her  ?   ? Disabled- for stomach problems and orthopedic  problems, nerves  ? ?Social Determinants of Health  ? ?Financial Resource Strain: Not on file  ?Food Insecurity: Not on file  ?Transportation Needs: Not on file  ?Physical Activity: Not on file  ?Stress: Not on file  ?So

## 2022-02-19 NOTE — Anesthesia Postprocedure Evaluation (Signed)
Anesthesia Post Note ? ?Patient: Destiny Young ? ?Procedure(s) Performed: ESOPHAGOGASTRODUODENOSCOPY (EGD) WITH PROPOFOL ? ?Patient location during evaluation: Phase II ?Anesthesia Type: General ?Level of consciousness: awake and alert and oriented ?Pain management: pain level controlled ?Vital Signs Assessment: post-procedure vital signs reviewed and stable ?Respiratory status: spontaneous breathing, nonlabored ventilation and respiratory function stable ?Cardiovascular status: blood pressure returned to baseline and stable ?Postop Assessment: no apparent nausea or vomiting ?Anesthetic complications: no ? ? ?No notable events documented. ? ? ?Last Vitals:  ?Vitals:  ? 02/19/22 1329 02/19/22 1359  ?BP: (!) 131/56 (!) 96/42  ?Pulse: 61 (!) 59  ?Resp: 18 20  ?Temp: 36.7 ?C 36.6 ?C  ?SpO2: 98% 94%  ?  ?Last Pain:  ?Vitals:  ? 02/19/22 1359  ?TempSrc: Oral  ?PainSc: 0-No pain  ? ? ?  ?  ?  ?  ?  ?  ? ?Hilda Rynders C Alexcis Bicking ? ? ? ? ?

## 2022-02-19 NOTE — Op Note (Signed)
[facility] ?Patient Name: Destiny Young ?Procedure Date: 02/19/2022 1:30 PM ?MRN: [id number] ?Date of Birth: 1956/07/23 ?Attending MD: Norvel Richards , MD ?CSN: [id number] ?Age: 66 ?Admit Type: Outpatient ?Procedure:                Upper GI endoscopy ?Indications:              Surveillance procedure, Acute gastric ulcer ?Providers:                Norvel Richards, MD, Lambert Mody,  ?                          Kristine L. Risa Grill, Technician ?Referring MD:              ?Medicines:                Propofol per Anesthesia ?Complications:            No immediate complications. ?Estimated Blood Loss:     Estimated blood loss: none. ?Procedure:                Pre-Anesthesia Assessment: ?                          - Prior to the procedure, a History and Physical  ?                          was performed, and patient medications and  ?                          allergies were reviewed. The patient's tolerance of  ?                          previous anesthesia was also reviewed. The risks  ?                          and benefits of the procedure and the sedation  ?                          options and risks were discussed with the patient.  ?                          All questions were answered, and informed consent  ?                          was obtained. Prior Anticoagulants: The patient has  ?                          taken no previous anticoagulant or antiplatelet  ?                          agents. ASA Grade Assessment: III - A patient with  ?                          severe systemic disease. After reviewing the risks  ?  and benefits, the patient was deemed in  ?                          satisfactory condition to undergo the procedure. ?                          After obtaining informed consent, the endoscope was  ?                          passed under direct vision. Throughout the  ?                          procedure, the patient's blood pressure, pulse, and  ?                           oxygen saturations were monitored continuously. The  ?                          GIF-H190 ([id number]) scope was introduced through the  ?                          mouth, and advanced to the second part of duodenum.  ?                          The upper GI endoscopy was accomplished without  ?                          difficulty. The patient tolerated the procedure  ?                          well. ?Scope In: 1:50:40 PM ?Scope Out: 1:54:10 PM ?Total Procedure Duration: 0 hours 3 minutes 30 seconds  ?Findings: ?     The examined esophagus was normal aside from the previously noted distal  ?     esophageal diverticulum. Tubular esophagus widely patent.. ?     The entire examined stomach was normal. Evidence of intact  ?     fundoplication. Previously noted gastric ulcers completely healed. ?     The duodenal bulb and second portion of the duodenum were normal. ?Impression:               Esophageal diverticulum. ?                          Intact fundoplication; previously noted ulcers  ?                          completely healed ?                          Normal duodenal bulb and second portion of the  ?                          duodenum. ?                          - No specimens collected. ?Moderate Sedation: ?  Moderate (conscious) sedation was personally administered by an  ?     anesthesia professional. The following parameters were monitored: oxygen  ?     saturation, heart rate, blood pressure, respiratory rate, EKG, adequacy  ?     of pulmonary ventilation, and response to care. ?Recommendation:           - Patient has a contact number available for  ?                          emergencies. The signs and symptoms of potential  ?                          delayed complications were discussed with the  ?                          patient. Return to normal activities tomorrow.  ?                          Written discharge instructions were provided to the  ?                          patient. ?                           - Resume previous diet. ?                          - Continue present medications. Continue to avoid  ?                          NSAIDs. ?                          - Return to my office in 3 months. Because of  ?                          patient's ongoing globus symptoms we will obtain a  ?                          barium pill esophagram prior to her next office  ?                          visit. ?Procedure Code(s):        --- Professional --- ?                          [phone], Esophagogastroduodenoscopy, flexible,  ?                          transoral; diagnostic, including collection of  ?                          specimen(s) by brushing or washing, when performed  ?                          (separate procedure) ?Diagnosis Code(s):        ---  Professional --- ?                          K25.3, Acute gastric ulcer without hemorrhage or  ?                          perforation ?CPT copyright 2019 American Medical Association. All rights reserved. ?The codes documented in this report are preliminary and upon coder review may  ?be revised to meet current compliance requirements. ?Cristopher Estimable. Hoke Baer, MD ?Norvel Richards, MD ?02/19/2022 1:59:43 PM ?This report has been signed electronically. ?Number of Addenda: 0 ?

## 2022-02-19 NOTE — Discharge Instructions (Signed)
EGD ?Discharge instructions ?Please read the instructions outlined below and refer to this sheet in the next few weeks. These discharge instructions provide you with general information on caring for yourself after you leave the hospital. Your doctor may also give you specific instructions. While your treatment has been planned according to the most current medical practices available, unavoidable complications occasionally occur. If you have any problems or questions after discharge, please call your doctor. ?ACTIVITY ?You may resume your regular activity but move at a slower pace for the next 24 hours.  ?Take frequent rest periods for the next 24 hours.  ?Walking will help expel (get rid of) the air and reduce the bloated feeling in your abdomen.  ?No driving for 24 hours (because of the anesthesia (medicine) used during the test).  ?You may shower.  ?Do not sign any important legal documents or operate any machinery for 24 hours (because of the anesthesia used during the test).  ?NUTRITION ?Drink plenty of fluids.  ?You may resume your normal diet.  ?Begin with a light meal and progress to your normal diet.  ?Avoid alcoholic beverages for 24 hours or as instructed by your caregiver.  ?MEDICATIONS ?You may resume your normal medications unless your caregiver tells you otherwise.  ?WHAT YOU CAN EXPECT TODAY ?You may experience abdominal discomfort such as a feeling of fullness or ?gas? pains.  ?FOLLOW-UP ?Your doctor will discuss the results of your test with you.  ?SEEK IMMEDIATE MEDICAL ATTENTION IF ANY OF THE FOLLOWING OCCUR: ?Excessive nausea (feeling sick to your stomach) and/or vomiting.  ?Severe abdominal pain and distention (swelling).  ?Trouble swallowing.  ?Temperature over 101? F (37.8? C).  ?Rectal bleeding or vomiting of blood.   ? ?Continue Protonix 40 mg twice daily before meals ? ?We will schedule a barium pill esophagram to further evaluate your symptoms. ? ?Your ulcers have completely  healed. ? ?Office visit with [place] in 3 months Neil Crouch) ? ? at patient request, I called Veatrice Bourbon at [phone]-  reviewed findings and recommendations ?

## 2022-02-19 NOTE — Anesthesia Preprocedure Evaluation (Signed)
Anesthesia Evaluation  ?Patient identified by MRN, date of birth, ID band ?Patient awake ? ? ? ?Reviewed: ?Allergy & Precautions, NPO status , Patient's Chart, lab work & pertinent test results ? ?Airway ?Mallampati: II ? ?TM Distance: >3 FB ?Neck ROM: Full ? ? ? Dental ? ?(+) Dental Advisory Given ?No notable dental injury:   ?Pulmonary ?asthma ,  ?  ?Pulmonary exam normal ?breath sounds clear to auscultation ? ? ? ? ? ? Cardiovascular ?Exercise Tolerance: Good ?Normal cardiovascular exam+ dysrhythmias Ventricular Tachycardia + Valvular Problems/Murmurs AI  ?Rhythm:Regular Rate:Normal ? ? ?  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 GI/Hepatic ?PUD, GERD  Medicated and Poorly Controlled,(+)  ?  ? substance abuse ? marijuana use,   ?Endo/Other  ?negative endocrine ROS ? Renal/GU ?negative Renal ROS  ?negative genitourinary ?  ?Musculoskeletal ? ?(+) Arthritis ,  ? Abdominal ?  ?Peds ?negative pediatric ROS ?(+)  Hematology ?negative hematology ROS ?(+)   ?Anesthesia Other Findings ? ? Reproductive/Obstetrics ?negative OB ROS ? ?  ? ? ? ? ? ? ? ? ? ? ? ? ? ?  ?  ? ? ? ? ? ? ? ? ?Anesthesia Physical ?Anesthesia Plan ? ?ASA: 3 ? ?Anesthesia Plan: General  ? ?Post-op Pain Management: Minimal or no pain anticipated  ? ?Induction: Intravenous ? ?PONV Risk Score and Plan:  ? ?Airway Management Planned: Nasal Cannula and Natural Airway ? ?Additional Equipment:  ? ?Intra-op Plan:  ? ?Post-operative Plan:  ? ?Informed Consent: I have reviewed the patients History and Physical, chart, labs and discussed the procedure including the risks, benefits and alternatives for the proposed anesthesia with the patient or authorized representative who has indicated his/her understanding and acceptance.  ? ? ? ?Dental advisory given ? ?Plan Discussed with: CRNA and Surgeon ? ?Anesthesia Plan Comments:    ? ? ? ? ? ? ?Anesthesia Quick Evaluation ? ?

## 2022-02-19 NOTE — Anesthesia Procedure Notes (Signed)
Date/Time: 02/19/2022 1:45 PM ?Performed by: Vista Deck, CRNA ?Pre-anesthesia Checklist: Patient identified, Emergency Drugs available, Suction available, Timeout performed and Patient being monitored ?Patient Re-evaluated:Patient Re-evaluated prior to induction ?Oxygen Delivery Method: Nasal Cannula ? ? ? ? ?

## 2022-02-19 NOTE — Transfer of Care (Signed)
Immediate Anesthesia Transfer of Care Note ? ?Patient: Destiny Young ? ?Procedure(s) Performed: ESOPHAGOGASTRODUODENOSCOPY (EGD) WITH PROPOFOL ? ?Patient Location: Short Stay ? ?Anesthesia Type:General ? ?Level of Consciousness: awake and patient cooperative ? ?Airway & Oxygen Therapy: Patient Spontanous Breathing ? ?Post-op Assessment: Report given to RN and Post -op Vital signs reviewed and stable ? ?Post vital signs: Reviewed and stable ? ?Last Vitals:  ?Vitals Value Taken Time  ?BP 96/42 02/19/22 1359  ?Temp 36.6 ?C 02/19/22 1359  ?Pulse 59 02/19/22 1359  ?Resp 20 02/19/22 1359  ?SpO2 94 % 02/19/22 1359  ? ? ?Last Pain:  ?Vitals:  ? 02/19/22 1359  ?TempSrc: Oral  ?PainSc: 0-No pain  ?   ? ?Patients Stated Pain Goal: 6 (02/19/22 1329) ? ?Complications: No notable events documented. ?

## 2022-02-23 ENCOUNTER — Telehealth: Payer: Self-pay

## 2022-02-23 ENCOUNTER — Telehealth: Payer: Self-pay | Admitting: Internal Medicine

## 2022-02-23 DIAGNOSIS — R1319 Other dysphagia: Secondary | ICD-10-CM

## 2022-02-23 NOTE — Telephone Encounter (Signed)
Pt LMOVM regarding after endoscopy with Dr. Gala Romney she has had headaches, ear pain, leg swelling, joint pains, no fever. Tested for covid --negative. Please call the pt. ?

## 2022-02-23 NOTE — Telephone Encounter (Signed)
BPE scheduled for 03/02/22 at 9:00am, arrive at 8:30am. NPO 3 hours prior to test.  ? ?Called pt, informed her of BPE appt. She thinks she may need to reschedule BPE appt. Gave her phone# to central scheduling in case she needs to reschedule BPE. ?

## 2022-02-23 NOTE — Telephone Encounter (Signed)
See other note

## 2022-02-23 NOTE — Telephone Encounter (Signed)
Patient called.  Headache body aches diaphoresis ear pain started last week around the time of her EGD.  EGD reviewed.  She denies chest abdominal pain or fever. 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 ? ?It is my assessment that her symptoms are coincidental to the EGD and the 2 episodes are unrelated. ?I recommend she follow-up with Dr. Huel Cote regarding these new unrelated symptoms to her recent EGD. ? ?Per plan, she needs a barium pill esophagram to further evaluate dysphagia.  Office visit 3 months. ?

## 2022-02-23 NOTE — Addendum Note (Signed)
Addended by: Zara Council C on: 02/23/2022 11:39 AM ? ? Modules accepted: Orders ? ?

## 2022-02-23 NOTE — Telephone Encounter (Signed)
Tammy took care of it ?

## 2022-02-23 NOTE — Telephone Encounter (Signed)
Communication noted.  

## 2022-02-23 NOTE — Telephone Encounter (Signed)
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  Denies fever or chest pain.  Denies abdominal pain. ? ?I reviewed the procedure with the patient. ? ?It appears that she is having new symptoms only coincidental to the EGD.  EGD and her symptoms are likely to unrelated ? ? ?See duplicate note. ?

## 2022-02-24 ENCOUNTER — Encounter (HOSPITAL_COMMUNITY): Payer: Self-pay | Admitting: Internal Medicine

## 2022-03-02 ENCOUNTER — Ambulatory Visit (HOSPITAL_COMMUNITY): Admission: RE | Admit: 2022-03-02 | Payer: Medicare Other | Source: Ambulatory Visit

## 2022-03-06 ENCOUNTER — Ambulatory Visit (HOSPITAL_COMMUNITY): Payer: Medicare Other | Admitting: Physical Therapy

## 2022-03-09 ENCOUNTER — Other Ambulatory Visit (HOSPITAL_COMMUNITY): Payer: Medicare Other

## 2022-03-10 ENCOUNTER — Encounter (HOSPITAL_COMMUNITY): Payer: Self-pay

## 2022-03-10 ENCOUNTER — Ambulatory Visit (HOSPITAL_COMMUNITY)
Admission: RE | Admit: 2022-03-10 | Discharge: 2022-03-10 | Disposition: A | Discharge status: Home / Self Care | Payer: Medicare Other | Source: Ambulatory Visit | Attending: Internal Medicine | Admitting: Internal Medicine

## 2022-03-10 ENCOUNTER — Ambulatory Visit (HOSPITAL_COMMUNITY): Payer: Medicare Other | Attending: Family Medicine

## 2022-03-10 DIAGNOSIS — M6281 Muscle weakness (generalized): Secondary | ICD-10-CM | POA: Diagnosis not present

## 2022-03-10 DIAGNOSIS — R1319 Other dysphagia: Secondary | ICD-10-CM | POA: Insufficient documentation

## 2022-03-10 DIAGNOSIS — M542 Cervicalgia: Secondary | ICD-10-CM | POA: Diagnosis not present

## 2022-03-10 DIAGNOSIS — M5459 Other low back pain: Secondary | ICD-10-CM

## 2022-03-10 DIAGNOSIS — M25551 Pain in right hip: Secondary | ICD-10-CM | POA: Diagnosis not present

## 2022-03-10 DIAGNOSIS — R2689 Other abnormalities of gait and mobility: Secondary | ICD-10-CM

## 2022-03-10 NOTE — Therapy (Signed)
?OUTPATIENT PHYSICAL THERAPY CERVICAL EVALUATION ? ? ?Patient Name: Destiny Young ?MRN: [id number] ?DOB:Nov 23, 1955, 66 y.o., female ?Today's Date: 03/11/2022 ? ? PT End of Session - 03/10/22 1259   ? ? Visit Number 1   ? Number of Visits 8   ? Date for PT Re-Evaluation 04/08/22   ? Authorization Type U[employer] Medicare (no visit limit, No pre auth)   ? Authorization - Visit Number 1   ? Authorization - Number of Visits 8   ? Progress Note Due on Visit 0.02   ? PT Start Time 1300   ? PT Stop Time 1340   ? PT Time Calculation (min) 40 min   ? Activity Tolerance Patient tolerated treatment well   ? Behavior During Therapy [facility] for tasks assessed/performed   ? ?  ?  ? ?  ? ? ?Past Medical History:  ?Diagnosis Date  ? Allergy   ? Anxiety   ? Aortic insufficiency   ? Arthritis   ? bulging discs  ? Asthma   ? Bradycardia   ? Burning chest pain   ? Cancer [facility])   ? abnormal PAP  ? Depression   ? Ejection fraction   ? GERD (gastroesophageal reflux disease)   ? History of Nissen fundoplication  ? Hemorrhoids   ? High cholesterol   ? History of Nissen fundoplication   ? 2000  ? IBS (irritable bowel syndrome)   ? Nodular thyroid disease 06/14/2020  ? Pelvic pain in female 12/05/2015  ? Pelvic relaxation 12/05/2015  ? Rectocele 12/05/2015  ? Seizures ([place])   ? per Dr Merlene Laughter  ? Skin tag 12/05/2015  ? TMJ (dislocation of temporomandibular joint)   ? Ventricular tachycardia ([place]) 08/21/2013  ? 7 beats ventricular tachycardia, rate 180, 8 day event recorder, July 13, 2013  //   nuclear stress study normal, November, 2014, normal EF by 2-D and nuclear   ? ?Past Surgical History:  ?Procedure Laterality Date  ? ABDOMINAL HYSTERECTOMY    ? ABDOMINAL SURGERY    ? hernia repair  ? BIOPSY  03/27/2021  ? Procedure: BIOPSY;  Surgeon: Daneil Dolin, MD;  [facility];  Service: Endoscopy;;  ? CERVICAL ABLATION    ? CHOLECYSTECTOMY    ? COLONOSCOPY  02/2010  ? Dr. Gala Romney: Normal terminal ileum to 10 cm, normal colonoscopy.  ?  ESOPHAGOGASTRODUODENOSCOPY  04/14/2006  ? [id number] esophagus with esophagogastric junction at 36 cm from the incisors.  Intact Nissen fundoplication, otherwise normal  ? ESOPHAGOGASTRODUODENOSCOPY  02/2010  ? Dr. Gala Romney: normal, status post intact Nissen Fundoplication, antral erosions and erythema with chronic gastritis but no H. pylori on biopsies.  ? ESOPHAGOGASTRODUODENOSCOPY N/A 12/03/2014  ? Dr.Rourk- intact fundoplication, antral erosions s/p bx. Bx= focally eroded reactive gastopathy negative for hpylori  ? ESOPHAGOGASTRODUODENOSCOPY (EGD) WITH PROPOFOL N/A 03/27/2021  ? Procedure: ESOPHAGOGASTRODUODENOSCOPY (EGD) WITH PROPOFOL;  Surgeon: Daneil Dolin, MD;  [facility];  Service: Endoscopy;  Laterality: N/A;  Patient cannot come in any earlier.  ? ESOPHAGOGASTRODUODENOSCOPY (EGD) WITH PROPOFOL N/A 02/19/2022  ? Procedure: ESOPHAGOGASTRODUODENOSCOPY (EGD) WITH PROPOFOL;  Surgeon: Daneil Dolin, MD;  [facility];  Service: Endoscopy;  Laterality: N/A;  2:45pm  ? EXTERNAL EAR SURGERY    ? HERNIA REPAIR    ? MALONEY DILATION N/A 03/27/2021  ? Procedure: MALONEY DILATION;  Surgeon: Daneil Dolin, MD;  [facility];  Service: Endoscopy;  Laterality: N/A;  ? NISSEN FUNDOPLICATION  1017  ? NISSEN  FUNDOPLICATION    ? NOSE SURGERY    ? SHOULDER SURGERY    ? TONSILLECTOMY    ? ?Patient Active Problem List  ? Diagnosis Date Noted  ? 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 RLQ abdominal pain 12/27/2020  ? Nausea without vomiting 12/27/2020  ? Fatigue 12/27/2020  ? Esophageal dysphagia 12/27/2020  ? Nodular thyroid disease 06/14/2020  ? Thyroiditis 06/14/2020  ? Pelvic pain 01/08/2020  ? Pelvic relaxation due to cystocele, midline 01/08/2020  ? Excessive and redundant skin and subcutaneous tissue 01/08/2020  ? Vaginal atrophy 01/08/2020  ? Screening for colorectal cancer 01/08/2020  ? Seizure disorder ([place]) 05/28/2017  ? Generalized anxiety disorder 01/06/2017  ? HLD  (hyperlipidemia) 12/01/2016  ? Osteopenia determined by x-ray 12/01/2016  ? Anxiety with somatization 11/02/2016  ? Ventral hernia 07/21/2016  ? Pelvic relaxation 12/05/2015  ? Pelvic pain in female 12/05/2015  ? Mucosal abnormality of stomach   ? Constipation 10/11/2014  ? Choledocholithiasis 10/11/2014  ?  Class: Question of  ? IBS (irritable bowel syndrome)   ? History of Nissen fundoplication   ? RUQ pain 07/05/2013  ? GERD 02/04/2010  ? ? ?PCP: Catalina Antigua ? ?REFERRING PROVIDER: Catalina Antigua ? 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 ?THERAPY DIAG:  ?Neck pain ? ?Other low back pain ? ?Pain in right hip ? ?Muscle weakness (generalized) ? ?Other abnormalities of gait and mobility ? ?ONSET DATE: 2009 ? ?SUBJECTIVE:                                                                                                                                                                                                        ? ?SUBJECTIVE STATEMENT: ?Patient reports that she has neck pain that radiates down left scapula into left arm pit as well, crosses over at mid back onto right side and has pain going through right hip down into right knee. Pain has been present for last 15-20 years. Patient has had PT in the past in 1999, patient has also seen by a chiro 3 years ago. Patient has had no relief with therapies in the past. Patient referred from MD to try and prevent cervical spine surgery. Pt reports feeling of bowling ball sitting on head. Feels very exhausted after showering, all home activites are aggravating.  ? 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 Severe seizures in head, feels nerve activity, affects vision of left eye, nausea, left facial  numbness, symptoms lasting for hours or minutes, last one occurred last night. Illictied by noise, sound,  smells, vertical blinds.  ? ?PAIN:  ?Are you having pain? Yes: NPRS scale: 8/10 ?Pain location: neck, 8/10. Back 8/10 ?Pain description: aching, constantly, throbbing  ?Aggravating factors: any type of physical exercise, everything aggravates.  ?Relieving factors: sitting sideways with feet up ? ?PRECAUTIONS: None ? ?WEIGHT BEARING RESTRICTIONS No ? ?FALLS:  ?Has patient fallen in last 6 months? No ? ?LIVING ENVIRONMENT: ?Lives with: lives with their family and lives with their son ?Lives in: Mobile home ?Stairs: Yes: External: 3 steps; on left going up ?Has following equipment at home: Single point cane and Grab bars, back brace  ? ?OCCUPATION: has been on disability since 2017 ? ?PLOF: Independent with household mobility without device, Independent with community mobility without device, and occasional uses cane and back brace. ? ?PATIENT GOALS be able to get back to doing household chores, last known normal 8 years ago. Stop being in constant pain.  ? ?OBJECTIVE:  ? ?DIAGNOSTIC FINDINGS:  ?MRI CERVICAL SPINE FINDINGS ?  ?Alignment: Straightening of the normal cervical lordosis. Trace ?anterolisthesis C3 on C4, which is new from the prior exam. ?IMPRESSION: ?1. Progression of previously noted degenerative changes, with new ?moderate neural foraminal narrowing on the left at C7-T1 and mild ?neural foraminal narrowing on the left at C4-C5 and right at C7-T1. ?Additional mild neural foraminal narrowing at C5-C6 and moderate ?neural foraminal narrowing at C6-C7 is unchanged. No spinal canal ?stenosis. ?2. No spinal canal stenosis or neural foraminal narrowing in the ?lumbar spine. Progression of facet arthropathy at L5-S1, with fusion ?of the left facets. ? ?PATIENT SURVEYS:  ?FOTO 35 ? ? ?COGNITION: ?Overall cognitive status: Within functional limits for tasks assessed ? ? ?SENSATION: ?Decreased sensation on left but present at neck  ? ?POSTURE:  ?Increased T spine kyphosis.  ?Decreased lumbar lordosis ?Mild forward  head ? ?PALPATION: ?TTP right QL and along lumbar parspinals ?TTP left T  ? ?CERVICAL ROM:  ? ?Active ROM A/PROM (deg) ?03/11/2022  ?Flexion 30 degrees  ?Extension 10  ?Right lateral flexion 25*  ?Left lateral

## 2022-03-16 ENCOUNTER — Ambulatory Visit (HOSPITAL_COMMUNITY): Payer: Medicare Other

## 2022-03-16 DIAGNOSIS — M25551 Pain in right hip: Secondary | ICD-10-CM | POA: Diagnosis present

## 2022-03-16 DIAGNOSIS — M542 Cervicalgia: Secondary | ICD-10-CM | POA: Diagnosis present

## 2022-03-16 DIAGNOSIS — R2689 Other abnormalities of gait and mobility: Secondary | ICD-10-CM | POA: Diagnosis present

## 2022-03-16 DIAGNOSIS — M5459 Other low back pain: Secondary | ICD-10-CM

## 2022-03-16 DIAGNOSIS — M6281 Muscle weakness (generalized): Secondary | ICD-10-CM | POA: Diagnosis present

## 2022-03-16 NOTE — Therapy (Signed)
OUTPATIENT PHYSICAL THERAPY CERVICAL EVALUATION   Patient Name: Destiny Young MRN: [id number] DOB:Jun 21, 1956, 66 y.o., female Today's Date: 03/16/2022   PT End of Session - 03/16/22 1118     Visit Number 2    Number of Visits 8    Date for PT Re-Evaluation 04/08/22    Authorization Type UHC Medicare (no visit limit, No pre auth)    Authorization - Visit Number 2    Authorization - Number of Visits 8    Progress Note Due on Visit 8    PT Start Time 1120    PT Stop Time 1200    PT Time Calculation (min) 40 min    Activity Tolerance Patient tolerated treatment well    Behavior During Therapy WFL for tasks assessed/performed             Past Medical History:  Diagnosis Date   Allergy    Anxiety    Aortic insufficiency    Arthritis    bulging discs   Asthma    Bradycardia    Burning chest pain    Cancer ([facility])    abnormal PAP   Depression    Ejection fraction    GERD (gastroesophageal reflux disease)    History of Nissen fundoplication   Hemorrhoids    High cholesterol    History of Nissen fundoplication    7948   IBS (irritable bowel syndrome)    Nodular thyroid disease 06/14/2020   Pelvic pain in female 12/05/2015   Pelvic relaxation 12/05/2015   Rectocele 12/05/2015   Seizures ([place])    per Dr Merlene Laughter   Skin tag 12/05/2015   TMJ (dislocation of temporomandibular joint)    Ventricular tachycardia ([place]) 08/21/2013   7 beats ventricular tachycardia, rate 180, 8 day event recorder, July 13, 2013  //   nuclear stress study normal, November, 2014, normal EF by 2-D and nuclear    Past Surgical History:  Procedure Laterality Date   ABDOMINAL HYSTERECTOMY     ABDOMINAL SURGERY     hernia repair   BIOPSY  03/27/2021   Procedure: BIOPSY;  Surgeon: Daneil Dolin, MD;  [facility];  Service: Endoscopy;;   CERVICAL ABLATION     CHOLECYSTECTOMY     COLONOSCOPY  02/2010   Dr. Gala Romney: Normal terminal ileum to 10 cm, normal colonoscopy.    ESOPHAGOGASTRODUODENOSCOPY  04/14/2006   AXK:[id number] esophagus with esophagogastric junction at 36 cm from the incisors.  Intact Nissen fundoplication, otherwise normal   ESOPHAGOGASTRODUODENOSCOPY  02/2010   Dr. Gala Romney: normal, status post intact Nissen Fundoplication, antral erosions and erythema with chronic gastritis but no H. pylori on biopsies.   ESOPHAGOGASTRODUODENOSCOPY N/A 12/03/2014   Dr.Rourk- intact fundoplication, antral erosions s/p bx. Bx= focally eroded reactive gastopathy negative for hpylori   ESOPHAGOGASTRODUODENOSCOPY (EGD) WITH PROPOFOL N/A 03/27/2021   Procedure: ESOPHAGOGASTRODUODENOSCOPY (EGD) WITH PROPOFOL;  Surgeon: Daneil Dolin, MD;  [facility];  Service: Endoscopy;  Laterality: N/A;  Patient cannot come in any earlier.   ESOPHAGOGASTRODUODENOSCOPY (EGD) WITH PROPOFOL N/A 02/19/2022   Procedure: ESOPHAGOGASTRODUODENOSCOPY (EGD) WITH PROPOFOL;  Surgeon: Daneil Dolin, MD;  [facility];  Service: Endoscopy;  Laterality: N/A;  2:45pm   EXTERNAL EAR SURGERY     HERNIA REPAIR     MALONEY DILATION N/A 03/27/2021   Procedure: MALONEY DILATION;  Surgeon: Daneil Dolin, MD;  [facility];  Service: Endoscopy;  Laterality: N/A;   NISSEN FUNDOPLICATION  4920   NISSEN  FUNDOPLICATION     NOSE SURGERY     SHOULDER SURGERY     TONSILLECTOMY     Patient Active Problem List   Diagnosis Date Noted   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/12/2022   RLQ abdominal pain 12/27/2020   Nausea without vomiting 12/27/2020   Fatigue 12/27/2020   Esophageal dysphagia 12/27/2020   Nodular thyroid disease 06/14/2020   Thyroiditis 06/14/2020   Pelvic pain 01/08/2020   Pelvic relaxation due to cystocele, midline 01/08/2020   Excessive and redundant skin and subcutaneous tissue 01/08/2020   Vaginal atrophy 01/08/2020   Screening for colorectal cancer 01/08/2020   Seizure disorder ([place]) 05/28/2017   Generalized anxiety disorder 01/06/2017   HLD  (hyperlipidemia) 12/01/2016   Osteopenia determined by x-ray 12/01/2016   Anxiety with somatization 11/02/2016   Ventral hernia 07/21/2016   Pelvic relaxation 12/05/2015   Pelvic pain in female 12/05/2015   Mucosal abnormality of stomach    Constipation 10/11/2014   Choledocholithiasis 10/11/2014    Class: Question of   IBS (irritable bowel syndrome)    History of Nissen fundoplication    RUQ pain 07/05/2013   GERD 02/04/2010    PCP: Catalina Antigua  REFERRING PROVIDER: Catalina Antigua  REFERRING DIAG: [id number] Other Spondylosis with radiculopathy,cervical region   THERAPY DIAG:  Neck pain  Other low back pain  Pain in right hip  Muscle weakness (generalized)  Other abnormalities of gait and mobility  ONSET DATE: 2009  SUBJECTIVE:                                                                                                                                                                                                         SUBJECTIVE STATEMENT: Back feeling stiff, tossing and turning at night because hip and neck are stiff at night. Left neck hurting, right hip and right knee pain. Most prominently be affected by neck 9/10 pain.    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 Severe seizures in head, feels nerve activity, affects vision of left eye, nausea, left facial numbness, symptoms lasting for hours or minutes, last one occurred last night. Illictied by noise, sound, smells, vertical blinds.   PAIN:  Are you having pain? Yes: NPRS scale: 9/10 Pain location: neck, 8/10. Back 8/10 Pain description: aching, constantly, throbbing  Aggravating factors: any type of physical exercise, everything aggravates.  Relieving factors: sitting sideways with feet up  PRECAUTIONS: None  WEIGHT BEARING RESTRICTIONS No  FALLS:  Has patient fallen in  last 6 months? No  LIVING ENVIRONMENT: Lives with: lives with their family and lives with their son Lives in: Mobile home Stairs: Yes: External: 3 steps; on left going up Has following equipment at home: Single point cane and Grab bars, back brace   OCCUPATION: has been on disability since 2017  PLOF: Independent with household mobility without device, Independent with community mobility without device, and occasional uses cane and back brace.  PATIENT GOALS be able to get back to doing household chores, last known normal 8 years ago. Stop being in constant pain.   OBJECTIVE:   DIAGNOSTIC FINDINGS:  MRI CERVICAL SPINE FINDINGS   Alignment: Straightening of the normal cervical lordosis. Trace anterolisthesis C3 on C4, which is new from the prior exam. IMPRESSION: 1. Progression of previously noted degenerative changes, with new moderate neural foraminal narrowing on the left at C7-T1 and mild neural foraminal narrowing on the left at C4-C5 and right at C7-T1. Additional mild neural foraminal narrowing at C5-C6 and moderate neural foraminal narrowing at C6-C7 is unchanged. No spinal canal stenosis. 2. No spinal canal stenosis or neural foraminal narrowing in the lumbar spine. Progression of facet arthropathy at L5-S1, with fusion of the left facets.  PATIENT SURVEYS:  FOTO 35   COGNITION: Overall cognitive status: Within functional limits for tasks assessed   SENSATION: Decreased sensation on left but present at neck   POSTURE:  Increased T spine kyphosis.  Decreased lumbar lordosis Mild forward head  PALPATION: TTP right QL and along lumbar parspinals TTP left T   CERVICAL ROM:   Active ROM A/PROM (deg) 03/16/2022  Flexion 30 degrees  Extension 10  Right lateral flexion 25*  Left lateral flexion 25* more pain  Right rotation 38  Left rotation 30*   (Blank rows = not tested)  Lumbar ROM Extension 30 percent restriction Flexion [facility] R Lateral  40 percent  restriction L Lateral 30 percent restriction   UE MMT:  MMT Right 03/16/2022 Left 03/16/2022  Shoulder flexion 4/5 4/5  Shoulder extension    Shoulder abduction    Shoulder adduction    Shoulder extension    Shoulder internal rotation    Shoulder external rotation    Middle trapezius    Lower trapezius    Elbow flexion 5/5 5/5  Elbow extension    Wrist flexion    Wrist extension    Wrist ulnar deviation    Wrist radial deviation    Wrist pronation    Wrist supination    Grip strength     (Blank rows = not tested)   LE MMT:  MMT Right 03/16/2022 Left 03/16/2022  Hip flexion 3+ 4  Hip extension 3+/5 3+/5  Hip abduction 4/5 4/5  Hip adduction 4/5 4/5  Hip internal rotation    Hip external rotation    Knee flexion    Knee extension    Ankle dorsiflexion    Ankle plantarflexion    Ankle inversion    Ankle eversion     (Blank rows = not tested) *= pain  SPECIAL TESTS:  Slump test +   FUNCTIONAL TESTS:  5 times sit to stand: 16.88 seconds  PATIENT SURVEYS:  FOTO 35  TODAY'S TREATMENT:  03/16/22 Upper trap and levator stretch 3x10 sec hold b/l Cervical retraction with self gentle overpressure 2x10 Cervical retraction with neck rotations Shoulder rolls Neck rolls  Doorway stretch Hamstring stretch Piriformis stretch    PATIENT EDUCATION:  PATIENT EDUCATION:  Education details:  Patient educated on differentiating of pain vs stretching pull. Updated HEP Person educated: Patient Education method: Explanation, Demonstration, and Handouts Education comprehension: verbalized understanding, returned demonstration, verbal cues required, and tactile cues required    HOME EXERCISE PROGRAM: Access Code: [email or web address] URL: [email or web address] Date: 03/16/2022 Prepared by: Leota Jacobsen  Exercises - Seated Cervical Sidebending Stretch  - 2 x daily - 7 x weekly - 1 sets - 3 reps - 10 hold - Seated Levator Scapulae Stretch  - 2 x daily - 7 x  weekly - 1 sets - 3 reps - 10 hold - Cervical Retraction with Overpressure  - 2 x daily - 7 x weekly - 2 sets - 10 reps - Seated Cervical Retraction and Rotation  - 2 x daily - 7 x weekly - 1 sets - 10 reps - Seated Small Neck Circles  - 2 x daily - 7 x weekly - 2 sets - 10 reps - Seated Shoulder Rolls  - 2 x daily - 7 x weekly - 1 sets - 15 reps - Seated Table Hamstring Stretch  - 2 x daily - 7 x weekly - 1 sets - 13 reps - 10 hold - Doorway Pec Stretch at 90 Degrees Abduction  - 2 x daily - 7 x weekly - 1 sets - 3 reps - 10 hold - Seated Piriformis Stretch with Trunk Bend  - 2 x daily - 7 x weekly - 1 sets - 3 reps - 10 hold  ASSESSMENT:  CLINICAL IMPRESSION: OQ:[id number] with radiculopathy,cervical region, chronic low back pain, and right hip pain. Patient tolerates session fair. Pt with traveling pains with each new exercise. Pt reports pain in side of head, posterior head, front of neck, inferior right scapular region. Educated pt on muscle use and activation during exercises and stretching of muscles vs pain. Patient states that these fleeting and traveling pains may be from fibromyalgia. Pt will benefit from skilled PT services to try and improve cervical strength and stability to decrease pain and increase ability to perform ADLS.   OBJECTIVE IMPAIRMENTS decreased activity tolerance, decreased mobility, difficulty walking, decreased ROM, decreased strength, increased fascial restrictions, impaired flexibility, impaired sensation, impaired UE functional use, improper body mechanics, postural dysfunction, and pain.   ACTIVITY LIMITATIONS cleaning, community activity, occupation, Medical sales representative, yard work, and shopping.   PERSONAL FACTORS Fitness and 3+ comorbidities: Allergies, Anxiety or Panic Disorders, Arthritis, Asthma, Back pain, Gastrointestinal Disease, Headaches, Osteoporosis, Seizures, Sleep dysfunction, Stroke or TIA, Visual Impairment  are also affecting patient's functional outcome.     REHAB POTENTIAL: Fair chronicity of dx, multiple site of impairment  CLINICAL DECISION MAKING: Evolving/moderate complexity  EVALUATION COMPLEXITY: High   GOALS: Goals reviewed with patient? Yes   SHORT TERM GOALS: Target date: 03/30/2022  Patient will be independent with HEP in order to improve functional outcomes. Baseline:  Goal status: INITIAL  2.  Patient will report at least 25% improvement in symptoms for improved quality of life. Baseline:  Goal status: INITIAL 3. Patient will report less than or equal to 6/10 neck pain to work towards improved quality of life and movement needed to perform adls.   Baseline: 8/10  Goal status: INITIAL    LONG TERM GOALS: Target date: 04/13/2022  Patient will  improve b/l glute strength to 4/5 to improve ambulation and tranfers ability needed in home and community.  Baseline: 3+/5 Goal status: INITIAL  2.  Patient will improve FOTO score by at least 5 points in order to indicate improved tolerance to  activity. Baseline: 35 Goal status: INITIAL  3.  Patient will improve FTSS by 5 seconds to demonstrate improved ease of mobility and increased strength of LE to improve efficiency in tranfers Baseline: 18.66sec Goal status: INITIAL  4.  Patient will be able to ambulate at least 250 feet in 2MWT in order to demonstrate improved tolerance to activity. Baseline:  Goal status: INITIAL   PLAN: PT FREQUENCY: 2x/week  PT DURATION: 4 weeks  PLANNED INTERVENTIONS: Therapeutic exercises, Therapeutic activity, Neuromuscular re-education, Balance training, Gait training, Patient/Family education, Joint manipulation, Joint mobilization, Stair training, Orthotic/Fit training, DME instructions, Aquatic Therapy, Dry Needling, Electrical stimulation, Spinal manipulation, Spinal mobilization, Cryotherapy, Moist heat, Compression bandaging, scar mobilization, Splintting, Taping, Traction, Ultrasound, Ionotophoresis '4mg'$ /ml Dexamethasone, and  Manual therapy   PLAN FOR NEXT SESSION: cervical strengthening and stabilization   Prairie Stenberg, PT 03/16/2022, 11:19 AM

## 2022-03-18 ENCOUNTER — Ambulatory Visit (HOSPITAL_COMMUNITY): Payer: Medicare Other

## 2022-03-24 ENCOUNTER — Ambulatory Visit (HOSPITAL_COMMUNITY): Payer: Medicare Other

## 2022-03-27 ENCOUNTER — Ambulatory Visit (HOSPITAL_COMMUNITY): Payer: Medicare Other | Attending: Neurosurgery | Admitting: Physical Therapy

## 2022-03-27 DIAGNOSIS — M4722 Other spondylosis with radiculopathy, cervical region: Secondary | ICD-10-CM | POA: Insufficient documentation

## 2022-03-27 DIAGNOSIS — M6281 Muscle weakness (generalized): Secondary | ICD-10-CM

## 2022-03-27 DIAGNOSIS — M5459 Other low back pain: Secondary | ICD-10-CM

## 2022-03-27 DIAGNOSIS — M542 Cervicalgia: Secondary | ICD-10-CM

## 2022-03-27 NOTE — Therapy (Signed)
OUTPATIENT PHYSICAL THERAPY CERVICAL EVALUATION   Patient Name: Destiny Young MRN: [id number] DOB:[birth year], 66 y.o., female Today's Date: 03/27/2022   PT End of Session - 03/27/22 1400     Visit Number 3    Number of Visits 8    Date for PT Re-Evaluation 04/08/22    Authorization Type [employer] Medicare (no visit limit, No pre auth)    Authorization - Visit Number 3    Authorization - Number of Visits 8    Progress Note Due on Visit 8    PT Start Time 0865    PT Stop Time 1445    PT Time Calculation (min) 42 min    Activity Tolerance Patient tolerated treatment well    Behavior During Therapy WFL for tasks assessed/performed             Past Medical History:  Diagnosis Date   Allergy    Anxiety    Aortic insufficiency    Arthritis    bulging discs   Asthma    Bradycardia    Burning chest pain    Cancer (HCC)    abnormal PAP   Depression    Ejection fraction    GERD (gastroesophageal reflux disease)    History of Nissen fundoplication   Hemorrhoids    High cholesterol    History of Nissen fundoplication    7846   IBS (irritable bowel syndrome)    Nodular thyroid disease 06/14/2020   Pelvic pain in female 12/05/2015   Pelvic relaxation 12/05/2015   Rectocele 12/05/2015   Seizures ([place])    per Dr Merlene Laughter   Skin tag 12/05/2015   TMJ (dislocation of temporomandibular joint)    Ventricular tachycardia ([place]) 08/21/2013   7 beats ventricular tachycardia, rate 180, 8 day event recorder, July 13, 2013  //   nuclear stress study normal, November, 2014, normal EF by 2-D and nuclear    Past Surgical History:  Procedure Laterality Date   ABDOMINAL HYSTERECTOMY     ABDOMINAL SURGERY     hernia repair   BIOPSY  03/27/2021   Procedure: BIOPSY;  Surgeon: Daneil Dolin, MD;  [facility]: [facility];  Service: Endoscopy;;   CERVICAL ABLATION     CHOLECYSTECTOMY     COLONOSCOPY  02/2010   Dr. Gala Romney: Normal terminal ileum to 10 cm, normal colonoscopy.    ESOPHAGOGASTRODUODENOSCOPY  04/14/2006   NGE:[id number] esophagus with esophagogastric junction at 36 cm from the incisors.  Intact Nissen fundoplication, otherwise normal   ESOPHAGOGASTRODUODENOSCOPY  02/2010   Dr. Gala Romney: normal, status post intact Nissen Fundoplication, antral erosions and erythema with chronic gastritis but no H. pylori on biopsies.   ESOPHAGOGASTRODUODENOSCOPY N/A 12/03/2014   Dr.Rourk- intact fundoplication, antral erosions s/p bx. Bx= focally eroded reactive gastopathy negative for hpylori   ESOPHAGOGASTRODUODENOSCOPY (EGD) WITH PROPOFOL N/A 03/27/2021   Procedure: ESOPHAGOGASTRODUODENOSCOPY (EGD) WITH PROPOFOL;  Surgeon: Daneil Dolin, MD;  [facility]: [facility];  Service: Endoscopy;  Laterality: N/A;  Patient cannot come in any earlier.   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/12/2022   RLQ abdominal pain 12/27/2020   Nausea without vomiting 12/27/2020   Fatigue 12/27/2020   Esophageal dysphagia 12/27/2020   Nodular thyroid disease 06/14/2020   Thyroiditis 06/14/2020   Pelvic pain 01/08/2020   Pelvic relaxation due to cystocele, midline 01/08/2020   Excessive and redundant skin and subcutaneous tissue 01/08/2020   Vaginal atrophy 01/08/2020   Screening for colorectal cancer 01/08/2020   Seizure disorder ([place]) 05/28/2017   Generalized anxiety disorder 01/06/2017   HLD  (hyperlipidemia) 12/01/2016   Osteopenia determined by x-ray 12/01/2016   Anxiety with somatization 11/02/2016   Ventral hernia 07/21/2016   Pelvic relaxation 12/05/2015   Pelvic pain in female 12/05/2015   Mucosal abnormality of stomach    Constipation 10/11/2014   Choledocholithiasis 10/11/2014    Class: Question of   IBS (irritable bowel syndrome)    History of Nissen fundoplication    RUQ pain 07/05/2013   GERD 02/04/2010    PCP: Catalina Antigua  REFERRING PROVIDER: Catalina Antigua  REFERRING DIAG: [phone] Other Spondylosis with radiculopathy,cervical region   THERAPY DIAG:  Neck pain  Other low back pain  Muscle weakness (generalized)  ONSET DATE: 2009  SUBJECTIVE:                                                                                                                                                                                                         SUBJECTIVE STATEMENT:  Pt states that her main pain is on the left side of her neck.  She has been to chiropractors who have given up on her.    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 Severe seizures in head, feels nerve activity, affects vision of left eye, nausea, left facial numbness, symptoms lasting for hours or minutes, last one occurred last night. Illictied by noise, sound, smells, vertical blinds.   PAIN:  Are you having pain? Yes: NPRS scale: 7/10 Pain location: neck, 7/10. Back 5/10 Pain description: aching, constantly, throbbing  Aggravating factors: any type of physical exercise, everything aggravates.  Relieving factors: sitting sideways with feet up  PLOF: Independent with household mobility without device, Independent with community mobility without device, and occasional uses cane and back brace.  PATIENT GOALS be able to get  back to doing household chores, last  known normal 8 years ago. Stop being in constant pain.   OBJECTIVE:   DIAGNOSTIC FINDINGS:  MRI CERVICAL SPINE FINDINGS   Alignment: Straightening of the normal cervical lordosis. Trace anterolisthesis C3 on C4, which is new from the prior exam. IMPRESSION: 1. Progression of previously noted degenerative changes, with new moderate neural foraminal narrowing on the left at C7-T1 and mild neural foraminal narrowing on the left at C4-C5 and right at C7-T1. Additional mild neural foraminal narrowing at C5-C6 and moderate neural foraminal narrowing at C6-C7 is unchanged. No spinal canal stenosis. 2. No spinal canal stenosis or neural foraminal narrowing in the lumbar spine. Progression of facet arthropathy at L5-S1, with fusion of the left facets.  PATIENT SURVEYS:  FOTO 35  POSTURE:  Increased T spine kyphosis.  Decreased lumbar lordosis Mild forward head  PALPATION: TTP right QL and along lumbar parspinals TTP left T   CERVICAL ROM:   Active ROM A/PROM (deg) 03/27/2022  Flexion 30 degrees  Extension 10  Right lateral flexion 25*  Left lateral flexion 25* more pain  Right rotation 38  Left rotation 30*   (Blank rows = not tested)  Lumbar ROM Extension 30 percent restriction Flexion [facility] R Lateral  40 percent restriction L Lateral 30 percent restriction   UE MMT:  MMT Right 03/27/2022 Left 03/27/2022  Shoulder flexion 4/5 4/5  Shoulder extension    Shoulder abduction    Shoulder adduction    Shoulder extension    Shoulder internal rotation    Shoulder external rotation    Middle trapezius    Lower trapezius    Elbow flexion 5/5 5/5  Elbow extension    Wrist flexion    Wrist extension    Wrist ulnar deviation    Wrist radial deviation    Wrist pronation    Wrist supination    Grip strength     (Blank rows = not tested)   LE MMT:  MMT Right 03/27/2022 Left 03/27/2022  Hip flexion 3+ 4  Hip extension 3+/5 3+/5  Hip abduction 4/5 4/5  Hip adduction 4/5  4/5  Hip internal rotation    Hip external rotation    Knee flexion    Knee extension    Ankle dorsiflexion    Ankle plantarflexion    Ankle inversion    Ankle eversion     (Blank rows = not tested) *= pain  SPECIAL TESTS:  Slump test +   FUNCTIONAL TESTS:  5 times sit to stand: 16.88 seconds  PATIENT SURVEYS:  FOTO 35  TODAY'S TREATMENT:              03/27/2022             Standing:              Postural theraband exercises (green)             Scapular retraction x 10             Rows x 10             Shoulder extension x 10              Sitting:             Cervical and thoracic excursions x 3 each             Upper trap stretch x 3 20" stretch  Shoulder rolls backward x 10             Shoulder shrugs x 3             W back x 10              Cervical isometric for side bend and extension x 5" x 5 reps             Manual:  supine for soft tissue, suboccipital release, jt mobs and manual traction to improve mobility and pain.                03/16/22 Upper trap and levator stretch 3x10 sec hold b/l Cervical retraction with self gentle overpressure 2x10 Cervical retraction with neck rotations Shoulder rolls Neck rolls  Doorway stretch Hamstring stretch Piriformis stretch    PATIENT EDUCATION:  PATIENT EDUCATION:  Education details: Patient educated on differentiating of pain vs stretching pull. Updated HEP Person educated: Patient Education method: Explanation, Demonstration, and Handouts Education comprehension: verbalized understanding, returned demonstration, verbal cues required, and tactile cues required    HOME EXERCISE PROGRAM: Access Code: [email or web address] URL: [email or web address] Date: 03/16/2022 Prepared by: Leota Jacobsen  Exercises 6/2:  Cervical isometrics. - Seated Cervical Sidebending Stretch  - 2 x daily - 7 x weekly - 1 sets - 3 reps - 10 hold - Seated Levator Scapulae Stretch  - 2 x daily - 7 x weekly - 1 sets - 3  reps - 10 hold - Cervical Retraction with Overpressure  - 2 x daily - 7 x weekly - 2 sets - 10 reps - Seated Cervical Retraction and Rotation  - 2 x daily - 7 x weekly - 1 sets - 10 reps - Seated Small Neck Circles  - 2 x daily - 7 x weekly - 2 sets - 10 reps - Seated Shoulder Rolls  - 2 x daily - 7 x weekly - 1 sets - 15 reps - Seated Table Hamstring Stretch  - 2 x daily - 7 x weekly - 1 sets - 13 reps - 10 hold - Doorway Pec Stretch at 90 Degrees Abduction  - 2 x daily - 7 x weekly - 1 sets - 3 reps - 10 hold - Seated Piriformis Stretch with Trunk Bend  - 2 x daily - 7 x weekly - 1 sets - 3 reps - 10 hold  ASSESSMENT:  CLINICAL IMPRESSION: Pt with decreased spinal mobility on Lt with manual techniques.  Mild spasms in upper back area.  Manual completed to address theses issues.  Therapist advanced  Postural exercises as well as cervical strengthening.  OBJECTIVE IMPAIRMENTS decreased activity tolerance, decreased mobility, difficulty walking, decreased ROM, decreased strength, increased fascial restrictions, impaired flexibility, impaired sensation, impaired UE functional use, improper body mechanics, postural dysfunction, and pain.   ACTIVITY LIMITATIONS cleaning, community activity, occupation, Medical sales representative, yard work, and shopping.   PERSONAL FACTORS Fitness and 3+ comorbidities: Allergies, Anxiety or Panic Disorders, Arthritis, Asthma, Back pain, Gastrointestinal Disease, Headaches, Osteoporosis, Seizures, Sleep dysfunction, Stroke or TIA, Visual Impairment  are also affecting patient's functional outcome.    REHAB POTENTIAL: Fair chronicity of dx, multiple site of impairment  CLINICAL DECISION MAKING: Evolving/moderate complexity  EVALUATION COMPLEXITY: High   GOALS: Goals reviewed with patient? Yes   SHORT TERM GOALS: Target date: 04/10/2022  Patient will be independent with HEP in order to improve functional outcomes. Baseline:  Goal status: on-going   2.  Patient will report  at least 25% improvement in symptoms  for improved quality of life. Baseline:  Goal status:  3. Patient will report less than or equal to 6/10 neck pain to work towards improved quality of life and movement needed to perform adls.   Baseline: 8/10  Goal status: on-going     LONG TERM GOALS: Target date: 04/24/2022  Patient will  improve b/l glute strength to 4/5 to improve ambulation and tranfers ability needed in home and community.  Baseline: 3+/5 Goal status: on-going   2.  Patient will improve FOTO score by at least 5 points in order to indicate improved tolerance to activity. Baseline: 35 Goal status: on-going   3.  Patient will improve FTSS by 5 seconds to demonstrate improved ease of mobility and increased strength of LE to improve efficiency in tranfers Baseline: 18.66sec Goal status: on-going   4.  Patient will be able to ambulate at least 250 feet in 2MWT in order to demonstrate improved tolerance to activity. Baseline:  Goal status:on-going    PLAN: PT FREQUENCY: 2x/week  PT DURATION: 4 weeks  PLANNED INTERVENTIONS: Therapeutic exercises, Therapeutic activity, Neuromuscular re-education, Balance training, Gait training, Patient/Family education, Joint manipulation, Joint mobilization, Stair training, Orthotic/Fit training, DME instructions, Aquatic Therapy, Dry Needling, Electrical stimulation, Spinal manipulation, Spinal mobilization, Cryotherapy, Moist heat, Compression bandaging, scar mobilization, Splintting, Taping, Traction, Ultrasound, Ionotophoresis '4mg'$ /ml Dexamethasone, and Manual therapy   PLAN FOR NEXT SESSION:  continue manual as well as cervical strengthening and stabilization  Rayetta Humphrey, PT CLT [phone]  03/27/2022, 2:52 PM

## 2022-03-30 ENCOUNTER — Encounter (HOSPITAL_COMMUNITY): Payer: Self-pay

## 2022-03-30 ENCOUNTER — Ambulatory Visit (HOSPITAL_COMMUNITY): Payer: Medicare Other | Attending: Neurosurgery

## 2022-03-30 DIAGNOSIS — M5459 Other low back pain: Secondary | ICD-10-CM

## 2022-03-30 DIAGNOSIS — M6281 Muscle weakness (generalized): Secondary | ICD-10-CM

## 2022-03-30 DIAGNOSIS — M25551 Pain in right hip: Secondary | ICD-10-CM

## 2022-03-30 DIAGNOSIS — R2689 Other abnormalities of gait and mobility: Secondary | ICD-10-CM

## 2022-03-30 DIAGNOSIS — M542 Cervicalgia: Secondary | ICD-10-CM

## 2022-03-30 DIAGNOSIS — M4722 Other spondylosis with radiculopathy, cervical region: Secondary | ICD-10-CM | POA: Insufficient documentation

## 2022-03-30 NOTE — Therapy (Signed)
OUTPATIENT PHYSICAL THERAPY CERVICAL EVALUATION   Patient Name: Destiny Young MRN: [id number] DOB:01/28/1956, 66 y.o., female Today's Date: 03/30/2022   PT End of Session - 03/30/22 1515     Visit Number 4    Number of Visits 8    Date for PT Re-Evaluation 04/08/22    Authorization Type [employer] Medicare (no visit limit, No pre auth)    Authorization - Visit Number 4    Authorization - Number of Visits 8    Progress Note Due on Visit 8    PT Start Time 2119    PT Stop Time 1600    PT Time Calculation (min) 44 min    Activity Tolerance Patient tolerated treatment well    Behavior During Therapy WFL for tasks assessed/performed             Past Medical History:  Diagnosis Date   Allergy    Anxiety    Aortic insufficiency    Arthritis    bulging discs   Asthma    Bradycardia    Burning chest pain    Cancer (HCC)    abnormal PAP   Depression    Ejection fraction    GERD (gastroesophageal reflux disease)    History of Nissen fundoplication   Hemorrhoids    High cholesterol    History of Nissen fundoplication    4174   IBS (irritable bowel syndrome)    Nodular thyroid disease 06/14/2020   Pelvic pain in female 12/05/2015   Pelvic relaxation 12/05/2015   Rectocele 12/05/2015   Seizures ([place])    per Dr Merlene Laughter   Skin tag 12/05/2015   TMJ (dislocation of temporomandibular joint)    Ventricular tachycardia ([place]) 08/21/2013   7 beats ventricular tachycardia, rate 180, 8 day event recorder, July 13, 2013  //   nuclear stress study normal, November, 2014, normal EF by 2-D and nuclear    Past Surgical History:  Procedure Laterality Date   ABDOMINAL HYSTERECTOMY     ABDOMINAL SURGERY     hernia repair   BIOPSY  03/27/2021   Procedure: BIOPSY;  Surgeon: Daneil Dolin, MD;  [facility]: [facility];  Service: Endoscopy;;   CERVICAL ABLATION     CHOLECYSTECTOMY     COLONOSCOPY  02/2010   Dr. Gala Romney: Normal terminal ileum to 10 cm, normal colonoscopy.    ESOPHAGOGASTRODUODENOSCOPY  04/14/2006   [id number] esophagus with esophagogastric junction at 36 cm from the incisors.  Intact Nissen fundoplication, otherwise normal   ESOPHAGOGASTRODUODENOSCOPY  02/2010   Dr. Gala Romney: normal, status post intact Nissen Fundoplication, antral erosions and erythema with chronic gastritis but no H. pylori on biopsies.   ESOPHAGOGASTRODUODENOSCOPY N/A 12/03/2014   Dr.Rourk- intact fundoplication, antral erosions s/p bx. Bx= focally eroded reactive gastopathy negative for hpylori   ESOPHAGOGASTRODUODENOSCOPY (EGD) WITH PROPOFOL N/A 03/27/2021   Procedure: ESOPHAGOGASTRODUODENOSCOPY (EGD) WITH PROPOFOL;  Surgeon: Daneil Dolin, MD;  [facility]: [facility];  Service: Endoscopy;  Laterality: N/A;  Patient cannot come in any earlier.   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/12/2022   RLQ abdominal pain 12/27/2020   Nausea without vomiting 12/27/2020   Fatigue 12/27/2020   Esophageal dysphagia 12/27/2020   Nodular thyroid disease 06/14/2020   Thyroiditis 06/14/2020   Pelvic pain 01/08/2020   Pelvic relaxation due to cystocele, midline 01/08/2020   Excessive and redundant skin and subcutaneous tissue 01/08/2020   Vaginal atrophy 01/08/2020   Screening for colorectal cancer 01/08/2020   Seizure disorder ([place]) 05/28/2017   Generalized anxiety disorder 01/06/2017   HLD  (hyperlipidemia) 12/01/2016   Osteopenia determined by x-ray 12/01/2016   Anxiety with somatization 11/02/2016   Ventral hernia 07/21/2016   Pelvic relaxation 12/05/2015   Pelvic pain in female 12/05/2015   Mucosal abnormality of stomach    Constipation 10/11/2014   Choledocholithiasis 10/11/2014    Class: Question of   IBS (irritable bowel syndrome)    History of Nissen fundoplication    RUQ pain 07/05/2013   GERD 02/04/2010    PCP: Catalina Antigua  REFERRING PROVIDER: Catalina Antigua  REFERRING DIAG: [phone] Other Spondylosis with radiculopathy,cervical region   THERAPY DIAG:  Neck pain  Other low back pain  Muscle weakness (generalized)  Pain in right hip  Other abnormalities of gait and mobility  ONSET DATE: 2009  SUBJECTIVE:                                                                                                                                                                                                         SUBJECTIVE STATEMENT:   Patient states that she is feeling stiff. Reports completing HEP. Pt states that her main pain is on the left side of her neck.  Patient feeling right side of back hurting.  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 Severe seizures in head, feels nerve activity, affects vision of left eye, nausea, left facial numbness, symptoms lasting for hours or minutes, last one occurred last night. Illictied by noise, sound, smells, vertical blinds.   PAIN:  Are you having pain? Yes: NPRS scale: 7/10 Pain location: neck, 7/10. Back 5/10 Pain description: aching, constantly, throbbing  Aggravating factors: any type of physical exercise, everything aggravates.  Relieving factors: sitting sideways with feet up  PLOF: Independent with household mobility without device, Independent with community  mobility without  device, and occasional uses cane and back brace.  PATIENT GOALS be able to get back to doing household chores, last known normal 8 years ago. Stop being in constant pain.   OBJECTIVE:   DIAGNOSTIC FINDINGS:  MRI CERVICAL SPINE FINDINGS   Alignment: Straightening of the normal cervical lordosis. Trace anterolisthesis C3 on C4, which is new from the prior exam. IMPRESSION: 1. Progression of previously noted degenerative changes, with new moderate neural foraminal narrowing on the left at C7-T1 and mild neural foraminal narrowing on the left at C4-C5 and right at C7-T1. Additional mild neural foraminal narrowing at C5-C6 and moderate neural foraminal narrowing at C6-C7 is unchanged. No spinal canal stenosis. 2. No spinal canal stenosis or neural foraminal narrowing in the lumbar spine. Progression of facet arthropathy at L5-S1, with fusion of the left facets.  PATIENT SURVEYS:  FOTO 35  POSTURE:  Increased T spine kyphosis.  Decreased lumbar lordosis Mild forward head  PALPATION: TTP right QL and along lumbar parspinals TTP left T   CERVICAL ROM:   Active ROM A/PROM (deg) 03/30/2022  Flexion 30 degrees  Extension 10  Right lateral flexion 25*  Left lateral flexion 25* more pain  Right rotation 38  Left rotation 30*   (Blank rows = not tested)  Lumbar ROM Extension 30 percent restriction Flexion [facility] R Lateral  40 percent restriction L Lateral 30 percent restriction   UE MMT:  MMT Right 03/30/2022 Left 03/30/2022  Shoulder flexion 4/5 4/5  Shoulder extension    Shoulder abduction    Shoulder adduction    Shoulder extension    Shoulder internal rotation    Shoulder external rotation    Middle trapezius    Lower trapezius    Elbow flexion 5/5 5/5  Elbow extension    Wrist flexion    Wrist extension    Wrist ulnar deviation    Wrist radial deviation    Wrist pronation    Wrist supination    Grip strength     (Blank rows = not tested)   LE MMT:  MMT  Right 03/30/2022 Left 03/30/2022  Hip flexion 3+ 4  Hip extension 3+/5 3+/5  Hip abduction 4/5 4/5  Hip adduction 4/5 4/5  Hip internal rotation    Hip external rotation    Knee flexion    Knee extension    Ankle dorsiflexion    Ankle plantarflexion    Ankle inversion    Ankle eversion     (Blank rows = not tested) *= pain  SPECIAL TESTS:  Slump test +   FUNCTIONAL TESTS:  5 times sit to stand: 16.88 seconds  PATIENT SURVEYS:  FOTO 35  TODAY'S TREATMENT:    03/30/22     Manual:  supine for soft tissue, suboccipital release, jt mobs and manual traction to improve mobility and pain.   Self snag for cervical extension  Self snag for cervical rotation  Manual scap mobilization and vibration to left scapular region   Thoracic spine snag for extension following mulligan concept  Cervical retraction into ball on wall with added rotations x20  Horizontal abd with red theraband x20              03/27/2022             Standing:              Postural theraband exercises (green)             Scapular retraction x 10  Rows x 10             Shoulder extension x 10              Sitting:             Cervical and thoracic excursions x 3 each             Upper trap stretch x 3 20" stretch             Shoulder rolls backward x 10             Shoulder shrugs x 3             W back x 10              Cervical isometric for side bend and extension x 5" x 5 reps             Manual:  supine for soft tissue, suboccipital release, jt mobs and manual traction to improve mobility and pain.                03/16/22 Upper trap and levator stretch 3x10 sec hold b/l Cervical retraction with self gentle overpressure 2x10 Cervical retraction with neck rotations Shoulder rolls Neck rolls  Doorway stretch Hamstring stretch Piriformis stretch    PATIENT EDUCATION:  PATIENT EDUCATION:  Education details: Patient educated on differentiating of pain vs stretching pull. Updated HEP Person  educated: Patient Education method: Explanation, Demonstration, and Handouts Education comprehension: verbalized understanding, returned demonstration, verbal cues required, and tactile cues required    HOME EXERCISE PROGRAM: Access Code: [email or web address] URL: [email or web address] Date: 03/16/2022 Prepared by: Leota Jacobsen  Exercises 6/2:  Cervical isometrics. - Seated Cervical Sidebending Stretch  - 2 x daily - 7 x weekly - 1 sets - 3 reps - 10 hold - Seated Levator Scapulae Stretch  - 2 x daily - 7 x weekly - 1 sets - 3 reps - 10 hold - Cervical Retraction with Overpressure  - 2 x daily - 7 x weekly - 2 sets - 10 reps - Seated Cervical Retraction and Rotation  - 2 x daily - 7 x weekly - 1 sets - 10 reps - Seated Small Neck Circles  - 2 x daily - 7 x weekly - 2 sets - 10 reps - Seated Shoulder Rolls  - 2 x daily - 7 x weekly - 1 sets - 15 reps - Seated Table Hamstring Stretch  - 2 x daily - 7 x weekly - 1 sets - 13 reps - 10 hold - Doorway Pec Stretch at 90 Degrees Abduction  - 2 x daily - 7 x weekly - 1 sets - 3 reps - 10 hold - Seated Piriformis Stretch with Trunk Bend  - 2 x daily - 7 x weekly - 1 sets - 3 reps - 10 hold  ASSESSMENT:  CLINICAL IMPRESSION:  With self snags pt reports left shoulder pulling with right and left neck rotation. Patient reports improvement in pain post STM and suboccipital work. Patient with restriction in attempting to mobilize cervical spine and left scapula. Spoke to patient about focusing on not too many body parts at one time to be able to try and have improvement in at least one area. Pt understood and in agreement. 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 Therapist unable to find reports or imaging with the stated. Patient will continue to benefit from skilled PT services to improved mobility of cspine and decrease pain.  OBJECTIVE IMPAIRMENTS decreased activity tolerance, decreased mobility, difficulty walking, decreased ROM,  decreased strength, increased fascial restrictions, impaired flexibility, impaired sensation, impaired UE functional use, improper body mechanics, postural dysfunction, and pain.   ACTIVITY LIMITATIONS cleaning, community activity, occupation, Medical sales representative, yard work, and shopping.   PERSONAL FACTORS Fitness and 3+ comorbidities: Allergies, Anxiety or Panic Disorders, Arthritis, Asthma, Back pain, Gastrointestinal Disease, Headaches, Osteoporosis, Seizures, Sleep dysfunction, Stroke or TIA, Visual Impairment  are also affecting patient's functional outcome.    REHAB POTENTIAL: Fair chronicity of dx, multiple site of impairment  CLINICAL DECISION MAKING: Evolving/moderate complexity  EVALUATION COMPLEXITY: High   GOALS: Goals reviewed with patient? Yes   SHORT TERM GOALS: Target date: 04/13/2022  Patient will be independent with HEP in order to improve functional outcomes. Baseline:  Goal status: on-going   2.  Patient will report at least 25% improvement in symptoms for improved quality of life. Baseline:  Goal status:  3. Patient will report less than or equal to 6/10 neck pain to work towards improved quality of life and movement needed to perform adls.   Baseline: 8/10  Goal status: on-going     LONG TERM GOALS: Target date: 04/27/2022  Patient will  improve b/l glute strength to 4/5 to improve ambulation and tranfers ability needed in home and community.  Baseline: 3+/5 Goal status: on-going   2.  Patient will improve FOTO score by at least 5 points in order to indicate improved tolerance to activity. Baseline: 35 Goal status: on-going   3.  Patient will improve FTSS by 5 seconds to demonstrate improved ease of mobility and increased strength of LE to improve efficiency in tranfers Baseline: 18.66sec Goal status: on-going   4.  Patient will be able to ambulate at least 250 feet in 2MWT in order to demonstrate improved tolerance to activity. Baseline:  Goal status:on-going     PLAN: PT FREQUENCY: 2x/week  PT DURATION: 4 weeks  PLANNED INTERVENTIONS: Therapeutic exercises, Therapeutic activity, Neuromuscular re-education, Balance training, Gait training, Patient/Family education, Joint manipulation, Joint mobilization, Stair training, Orthotic/Fit training, DME instructions, Aquatic Therapy, Dry Needling, Electrical stimulation, Spinal manipulation, Spinal mobilization, Cryotherapy, Moist heat, Compression bandaging, scar mobilization, Splintting, Taping, Traction, Ultrasound, Ionotophoresis '4mg'$ /ml Dexamethasone, and Manual therapy   PLAN FOR NEXT SESSION:  continue manual as well as cervical strengthening and stabilization  Jonnathan Birman PT, DPT  03/30/2022, 2:52 PM

## 2022-04-02 ENCOUNTER — Ambulatory Visit (HOSPITAL_COMMUNITY): Payer: Medicare Other

## 2022-04-06 ENCOUNTER — Ambulatory Visit (HOSPITAL_COMMUNITY): Payer: Medicare Other | Attending: Neurosurgery

## 2022-04-06 ENCOUNTER — Encounter (HOSPITAL_COMMUNITY): Payer: Self-pay

## 2022-04-06 DIAGNOSIS — M5459 Other low back pain: Secondary | ICD-10-CM

## 2022-04-06 DIAGNOSIS — M545 Low back pain, unspecified: Secondary | ICD-10-CM | POA: Diagnosis not present

## 2022-04-06 DIAGNOSIS — R269 Unspecified abnormalities of gait and mobility: Secondary | ICD-10-CM | POA: Insufficient documentation

## 2022-04-06 DIAGNOSIS — M542 Cervicalgia: Secondary | ICD-10-CM | POA: Diagnosis not present

## 2022-04-06 DIAGNOSIS — M6281 Muscle weakness (generalized): Secondary | ICD-10-CM | POA: Insufficient documentation

## 2022-04-06 DIAGNOSIS — M25551 Pain in right hip: Secondary | ICD-10-CM | POA: Insufficient documentation

## 2022-04-06 DIAGNOSIS — M4722 Other spondylosis with radiculopathy, cervical region: Secondary | ICD-10-CM | POA: Diagnosis present

## 2022-04-06 DIAGNOSIS — R2689 Other abnormalities of gait and mobility: Secondary | ICD-10-CM

## 2022-04-06 NOTE — Therapy (Signed)
OUTPATIENT PHYSICAL THERAPY CERVICAL EVALUATION   Patient Name: Destiny Young MRN: [id number] DOB:05/12/1956, 66 y.o., female Today's Date: 04/06/2022  Progress Note   Reporting Period 03/10/22 to 04/06/22   See note below for Objective Data and Assessment of Progress/Goals    PT End of Session - 04/06/22 1349     Visit Number 5    Number of Visits 13    Date for PT Re-Evaluation 05/04/22    Authorization Type [employer] (no visit limit, No pre auth)    Authorization - Visit Number 5    Authorization - Number of Visits 13    Progress Note Due on Visit 13    PT Start Time 1350    PT Stop Time 1430    PT Time Calculation (min) 40 min    Activity Tolerance Patient tolerated treatment well    Behavior During Therapy WFL for tasks assessed/performed             Past Medical History:  Diagnosis Date   Allergy    Anxiety    Aortic insufficiency    Arthritis    bulging discs   Asthma    Bradycardia    Burning chest pain    Cancer (HCC)    abnormal PAP   Depression    Ejection fraction    GERD (gastroesophageal reflux disease)    History of Nissen fundoplication   Hemorrhoids    High cholesterol    History of Nissen fundoplication    1194   IBS (irritable bowel syndrome)    Nodular thyroid disease 06/14/2020   Pelvic pain in female 12/05/2015   Pelvic relaxation 12/05/2015   Rectocele 12/05/2015   Seizures ([place])    per Dr Merlene Laughter   Skin tag 12/05/2015   TMJ (dislocation of temporomandibular joint)    Ventricular tachycardia ([place]) 08/21/2013   7 beats ventricular tachycardia, rate 180, 8 day event recorder, July 13, 2013  //   nuclear stress study normal, November, 2014, normal EF by 2-D and nuclear    Past Surgical History:  Procedure Laterality Date   ABDOMINAL HYSTERECTOMY     ABDOMINAL SURGERY     hernia repair   BIOPSY  03/27/2021   Procedure: BIOPSY;  Surgeon: Daneil Dolin, MD;  [facility];  Service: Endoscopy;;   CERVICAL  ABLATION     CHOLECYSTECTOMY     COLONOSCOPY  02/2010   Dr. Gala Romney: Normal terminal ileum to 10 cm, normal colonoscopy.   ESOPHAGOGASTRODUODENOSCOPY  04/14/2006   RDE:[id number] esophagus with esophagogastric junction at 36 cm from the incisors.  Intact Nissen fundoplication, otherwise normal   ESOPHAGOGASTRODUODENOSCOPY  02/2010   Dr. Gala Romney: normal, status post intact Nissen Fundoplication, antral erosions and erythema with chronic gastritis but no H. pylori on biopsies.   ESOPHAGOGASTRODUODENOSCOPY N/A 12/03/2014   Dr.Rourk- intact fundoplication, antral erosions s/p bx. Bx= focally eroded reactive gastopathy negative for hpylori   ESOPHAGOGASTRODUODENOSCOPY (EGD) WITH PROPOFOL N/A 03/27/2021   Procedure: ESOPHAGOGASTRODUODENOSCOPY (EGD) WITH PROPOFOL;  Surgeon: Daneil Dolin, MD;  [facility];  Service: Endoscopy;  Laterality: N/A;  Patient cannot come in any earlier.   ESOPHAGOGASTRODUODENOSCOPY (EGD) WITH PROPOFOL N/A 02/19/2022   Procedure: ESOPHAGOGASTRODUODENOSCOPY (EGD) WITH PROPOFOL;  Surgeon: Daneil Dolin, MD;  [facility];  Service: Endoscopy;  Laterality: N/A;  2:45pm   EXTERNAL EAR SURGERY     HERNIA REPAIR     MALONEY DILATION N/A 03/27/2021   Procedure: MALONEY DILATION;  Surgeon: Gala Romney,  Cristopher Estimable, MD;  Location: [facility];  Service: Endoscopy;  Laterality: N/A;   NISSEN FUNDOPLICATION  2035   NISSEN FUNDOPLICATION     NOSE SURGERY     SHOULDER SURGERY     TONSILLECTOMY     Patient Active Problem List   Diagnosis Date Noted   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/12/2022   RLQ abdominal pain 12/27/2020   Nausea without vomiting 12/27/2020   Fatigue 12/27/2020   Esophageal dysphagia 12/27/2020   Nodular thyroid disease 06/14/2020   Thyroiditis 06/14/2020   Pelvic pain 01/08/2020   Pelvic relaxation due to cystocele, midline 01/08/2020   Excessive and redundant skin and subcutaneous tissue 01/08/2020   Vaginal atrophy 01/08/2020   Screening  for colorectal cancer 01/08/2020   Seizure disorder ([place]) 05/28/2017   Generalized anxiety disorder 01/06/2017   HLD (hyperlipidemia) 12/01/2016   Osteopenia determined by x-ray 12/01/2016   Anxiety with somatization 11/02/2016   Ventral hernia 07/21/2016   Pelvic relaxation 12/05/2015   Pelvic pain in female 12/05/2015   Mucosal abnormality of stomach    Constipation 10/11/2014   Choledocholithiasis 10/11/2014    Class: Question of   IBS (irritable bowel syndrome)    History of Nissen fundoplication    RUQ pain 07/05/2013   GERD 02/04/2010    PCP: Catalina Antigua  REFERRING PROVIDER: Catalina Antigua  REFERRING DIAG: [phone] Other Spondylosis with radiculopathy,cervical region   THERAPY DIAG:  Neck pain  Other low back pain  Muscle weakness (generalized)  Pain in right hip  Other abnormalities of gait and mobility  ONSET DATE: 2009  SUBJECTIVE:                                                                                                                                                                                                         SUBJECTIVE STATEMENT:   Patient reports that she is still having a lot of pain in neck and shoulders despite doing HEP. Pain in left shoulder, neck, right back and both knees. Pt reports that she is trying to stay as active as possible.  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 Severe seizures in head, feels nerve activity, affects vision of left eye, nausea, left facial numbness, symptoms lasting for hours or minutes, last one occurred last night. Illictied by noise, sound, smells, vertical blinds.   PAIN:  Are you having pain? Yes: NPRS scale: 7/10 Pain location: neck, 8/10-> 10/10. Back 5/10 Pain description:  aching, constantly, throbbing  Aggravating factors: any type of physical exercise, everything  aggravates.  Relieving factors: sitting sideways with feet up  PLOF: Independent with household mobility without device, Independent with community mobility without device, and occasional uses cane and back brace.  PATIENT GOALS be able to get back to doing household chores, last known normal 8 years ago. Stop being in constant pain.   OBJECTIVE:   DIAGNOSTIC FINDINGS:  MRI CERVICAL SPINE FINDINGS   Alignment: Straightening of the normal cervical lordosis. Trace anterolisthesis C3 on C4, which is new from the prior exam. IMPRESSION: 1. Progression of previously noted degenerative changes, with new moderate neural foraminal narrowing on the left at C7-T1 and mild neural foraminal narrowing on the left at C4-C5 and right at C7-T1. Additional mild neural foraminal narrowing at C5-C6 and moderate neural foraminal narrowing at C6-C7 is unchanged. No spinal canal stenosis. 2. No spinal canal stenosis or neural foraminal narrowing in the lumbar spine. Progression of facet arthropathy at L5-S1, with fusion of the left facets.  PATIENT SURVEYS:  FOTO 35 Current 04/06/22 44  POSTURE:  Increased T spine kyphosis.  Decreased lumbar lordosis Mild forward head  PALPATION: TTP right QL and along lumbar parspinals TTP left T   CERVICAL ROM:   Active ROM A/PROM (deg) 03/10/2022 AROM  04/06/22  Flexion 30 degrees 30  Extension 10 18*  Right lateral flexion 25* 34  Left lateral flexion 25* more pain 22* more pain  Right rotation 38 35  Left rotation 30* 24   (Blank rows = not tested)  Lumbar ROM at IE  Extension 30 percent restriction Flexion WFL R Lateral  40 percent restriction  L Lateral 30 percent restriction    Lumbar ROM on 6/12 Extension 1 cm difference in range Flexion 9cm range Measured from bisecting line from psis and 15 cem above as starting point of measurement R Lateral  20 degrees L Laterall 24 degrees    UE MMT:  MMT Right 03/10/2022 Left 03/10/2022  Right 04/06/22 Left 04/06/22  Shoulder flexion 4/5 4/5 5/5 4/5  Shoulder extension      Shoulder abduction      Shoulder adduction      Shoulder extension      Shoulder internal rotation      Shoulder external rotation      Middle trapezius      Lower trapezius      Elbow flexion 5/5 5/5 5/5 5/5  Elbow extension      Wrist flexion      Wrist extension      Wrist ulnar deviation      Wrist radial deviation      Wrist pronation      Wrist supination      Grip strength       (Blank rows = not tested)   LE MMT:  MMT Right 03/10/2022 Left 03/10/2022 Right 04/06/22 Left  04/06/22  Hip flexion 3+ 4 4 4   Hip extension 3+/5 3+/5 3+ 3+  Hip abduction 4/5 4/5 5/5 5/5  Hip adduction 4/5 4/5 4/5 3+/5  Hip internal rotation      Hip external rotation      Knee flexion      Knee extension      Ankle dorsiflexion      Ankle plantarflexion      Ankle inversion      Ankle eversion       (Blank rows = not tested) *= pain  SPECIAL TESTS:  Slump  test + Leg length Left .5cm shorter than Right   FUNCTIONAL TESTS:  5 times sit to stand: 16.88 seconds 04/06/22  5 times sit to stand: 15.04 seconds  PATIENT SURVEYS:  FOTO 35 04/06/22 44  TODAY'S TREATMENT:    04/06/22  Re-assessment Green theraband  Rows x 15 Shoulder extensions x15 Shoulder rolls x10 Horizontal abd x15 with green theraband     03/30/22     Manual:  supine for soft tissue, suboccipital release, jt mobs and manual traction to improve mobility and pain.   Self snag for cervical extension  Self snag for cervical rotation  Manual scap mobilization and vibration to left scapular region   Thoracic spine snag for extension following mulligan concept  Cervical retraction into ball on wall with added rotations x20  Horizontal abd with red theraband x20              03/27/2022             Standing:              Postural theraband exercises (green)             Scapular retraction x 10             Rows x 10              Shoulder extension x 10              Sitting:             Cervical and thoracic excursions x 3 each             Upper trap stretch x 3 20" stretch             Shoulder rolls backward x 10             Shoulder shrugs x 3             W back x 10              Cervical isometric for side bend and extension x 5" x 5 reps             Manual:  supine for soft tissue, suboccipital release, jt mobs and manual traction to improve mobility and pain.                03/16/22 Upper trap and levator stretch 3x10 sec hold b/l Cervical retraction with self gentle overpressure 2x10 Cervical retraction with neck rotations Shoulder rolls Neck rolls  Doorway stretch Hamstring stretch Piriformis stretch    PATIENT EDUCATION:  PATIENT EDUCATION:  Education details: Patient educated on differentiating of pain vs stretching pull. Updated HEP Person educated: Patient Education method: Explanation, Demonstration, and Handouts Education comprehension: verbalized understanding, returned demonstration, verbal cues required, and tactile cues required    HOME EXERCISE PROGRAM: Access Code: [email or web address] URL: [email or web address] Date: 03/16/2022 Prepared by: Leota Jacobsen  Exercises 6/2:  Cervical isometrics. - Seated Cervical Sidebending Stretch  - 2 x daily - 7 x weekly - 1 sets - 3 reps - 10 hold - Seated Levator Scapulae Stretch  - 2 x daily - 7 x weekly - 1 sets - 3 reps - 10 hold - Cervical Retraction with Overpressure  - 2 x daily - 7 x weekly - 2 sets - 10 reps - Seated Cervical Retraction and Rotation  - 2 x daily - 7 x weekly - 1 sets - 10 reps - Seated Small Neck Circles  -  2 x daily - 7 x weekly - 2 sets - 10 reps - Seated Shoulder Rolls  - 2 x daily - 7 x weekly - 1 sets - 15 reps - Seated Table Hamstring Stretch  - 2 x daily - 7 x weekly - 1 sets - 13 reps - 10 hold - Doorway Pec Stretch at 90 Degrees Abduction  - 2 x daily - 7 x weekly - 1 sets - 3 reps - 10 hold - Seated  Piriformis Stretch with Trunk Bend  - 2 x daily - 7 x weekly - 1 sets - 3 reps - 10 hold  ASSESSMENT:  CLINICAL IMPRESSION:  Patient sessions have focused on cervical mobility and stability as well as improving postural dysfunction and scapular stabilization. Patient improves 9 points on foto scoring today. Improves 8 degrees in cervical extension with no reports of pain with this motion today, improves 9 degrees on R lateral flexion. There is a decrease in left cervical rotation as well as a decrease in lumbar extension rom.  Patient increases 1 grade in shoulder flexion. Patient improves right hip flexion 1 grade and b/l hip abd improves 1 grade. With performing left shoulder flexion mmt patient reports left anterior rib pain. On walking 2 minutes patient reports right ankle hurting though no abnormalities in the function of the foot and ankle are seen. After 1 min of walking patient reports that she feels like she can not hold her head up anymore and feels like it needs to be placed in a neck brace. On performing five time sit to stand without used of U[place], patient reports left shoulder and scapular pain. Despite limited sessions with focus mainly on cervical region patient meets 4 goals and makes improvements throughout different regions of body. Patients symptom reports are fleeting and hard to follow leaving therapist to believe that the root of the pains may be from fibromyalgia.  Patient will continue to benefit from skilled PT services to improved mobility of cspine and decrease pain.   OBJECTIVE IMPAIRMENTS decreased activity tolerance, decreased mobility, difficulty walking, decreased ROM, decreased strength, increased fascial restrictions, impaired flexibility, impaired sensation, impaired UE functional use, improper body mechanics, postural dysfunction, and pain.   ACTIVITY LIMITATIONS cleaning, community activity, occupation, Medical sales representative, yard work, and shopping.   PERSONAL FACTORS Fitness and  3+ comorbidities: Allergies, Anxiety or Panic Disorders, Arthritis, Asthma, Back pain, Gastrointestinal Disease, Headaches, Osteoporosis, Seizures, Sleep dysfunction, Stroke or TIA, Visual Impairment  are also affecting patient's functional outcome.    REHAB POTENTIAL: Fair chronicity of dx, multiple site of impairment  CLINICAL DECISION MAKING: Evolving/moderate complexity  EVALUATION COMPLEXITY: High   GOALS: Goals reviewed with patient? Yes   SHORT TERM GOALS: Target date: 03/25/2022  Patient will be independent with HEP in order to improve functional outcomes. Baseline: Current: independent  Goal status: MET   2.  Patient will report at least 25% improvement in symptoms for improved quality of life. Baseline:  Current:: 25% improvement  Goal status: MET  3. Patient will report less than or equal to 6/10 neck pain to work towards improved quality of life and movement needed to perform adls.   Baseline: 8/10  Current 10/10   Goal status: on-going     LONG TERM GOALS: Target date: 05/04/2022  Patient will  improve b/l glute strength to 4/5 to improve ambulation and tranfers ability needed in home and community.  Baseline: 3+/5 Goal status: on-going   2.  Patient will improve FOTO score by at  least 5 points in order to indicate improved tolerance to activity. Baseline: 35 Current: 44 Goal status: Met  3.  Patient will improve FTSS by 5 seconds to demonstrate improved ease of mobility and increased strength of LE to improve efficiency in tranfers Baseline: 18.66sec Goal status: on-going   4.  Patient will be able to ambulate at least  450 feet in 2MWT in order to demonstrate improved tolerance to activity. Baseline:  Current : 382 feet, initial goal met of 250' Goal status:Met, updated    5. Patient will report at least 50% improvement in symptoms for improved quality of life. Current: 25% increase Goal Status: on-going 6. Patient will report average neck pain  throughout day to not exceed 5/10 to improved ability to perform ADLs.   Current: 8/10->10/10   Goal status: on going   PLAN: PT FREQUENCY: 2x/week  PT DURATION: 4 weeks  PLANNED INTERVENTIONS: Therapeutic exercises, Therapeutic activity, Neuromuscular re-education, Balance training, Gait training, Patient/Family education, Joint manipulation, Joint mobilization, Stair training, Orthotic/Fit training, DME instructions, Aquatic Therapy, Dry Needling, Electrical stimulation, Spinal manipulation, Spinal mobilization, Cryotherapy, Moist heat, Compression bandaging, scar mobilization, Splintting, Taping, Traction, Ultrasound, Ionotophoresis 55m/ml Dexamethasone, and Manual therapy   PLAN FOR NEXT SESSION:  continue manual as well as cervical strengthening and stabilization, postural re-education  Meggan Dhaliwal PT, DPT  04/06/2022, 2:52 PM

## 2022-04-09 ENCOUNTER — Encounter (HOSPITAL_COMMUNITY): Payer: Medicare Other

## 2022-04-10 ENCOUNTER — Ambulatory Visit (HOSPITAL_COMMUNITY): Payer: Medicare Other

## 2022-05-05 ENCOUNTER — Other Ambulatory Visit (HOSPITAL_COMMUNITY): Payer: Self-pay | Admitting: Family Medicine

## 2022-05-05 ENCOUNTER — Telehealth: Payer: Self-pay

## 2022-05-05 ENCOUNTER — Telehealth (HOSPITAL_COMMUNITY): Payer: Self-pay

## 2022-05-05 DIAGNOSIS — R921 Mammographic calcification found on diagnostic imaging of breast: Secondary | ICD-10-CM

## 2022-05-05 NOTE — Telephone Encounter (Signed)
Pt fell and hurt her hand, knee and twisted her back - she wants to be put on hold until she talks with her MD.

## 2022-05-05 NOTE — Telephone Encounter (Signed)
Pt called and left a vm stating that since having the bpe she has been having issues with bloating, her rectum feeling swollen, and a strong ammonia smell coming from her stool. Pt is wanting to know if she needs to be sooner than previously recommended.

## 2022-05-08 ENCOUNTER — Encounter (HOSPITAL_COMMUNITY): Payer: Medicare Other

## 2022-05-12 NOTE — Telephone Encounter (Signed)
FYI BPE was two months ago. Symptoms likely unrelated. Ok to wait until scheduled OV but you can offer her sooner appointment with Rourk or any APP, if there is one available.

## 2022-05-12 NOTE — Telephone Encounter (Signed)
Pt was made aware and verbalized understanding. Pt states that she has to work 05/26/22 so pt was transferred to the front to reschedule her appt.

## 2022-05-12 NOTE — Telephone Encounter (Signed)
noted 

## 2022-05-14 ENCOUNTER — Ambulatory Visit
Admission: RE | Admit: 2022-05-14 | Discharge: 2022-05-14 | Disposition: A | Discharge status: Home / Self Care | Payer: Medicare Other | Source: Ambulatory Visit | Attending: Family Medicine | Admitting: Family Medicine

## 2022-05-14 ENCOUNTER — Other Ambulatory Visit: Payer: Self-pay

## 2022-05-14 VITALS — BP 154/75 | HR 73 | Temp 98.5°F | Resp 20

## 2022-05-14 DIAGNOSIS — H6502 Acute serous otitis media, left ear: Secondary | ICD-10-CM | POA: Diagnosis not present

## 2022-05-14 DIAGNOSIS — H7292 Unspecified perforation of tympanic membrane, left ear: Secondary | ICD-10-CM | POA: Diagnosis not present

## 2022-05-14 MED ORDER — OFLOXACIN 0.3 % OT SOLN: 5.0000 [drp] | Freq: Two times a day (BID) | OTIC | 0 refills | Status: DC

## 2022-05-14 NOTE — ED Triage Notes (Signed)
Pt reports left ear pain with intermittent drainage x1 week. Pt reports thought origin of pain might be related to tooth on left side of mouth that needs to be pulled. Pt reports had abx px from dentist to use if mouth got worse before dental appointment. Pt filled px for amoxicillin x5 days ago. Pt reports drainage to left ear has stopped but reports continued pain and decreased hearing.

## 2022-05-14 NOTE — ED Provider Notes (Signed)
[facility]    CSN: [id number] Arrival date & time: 05/14/22  1355      History   Chief Complaint Chief Complaint  Patient presents with   Ear Pain    HPI Destiny Young is a 66 y.o. female.   Patient presenting today with 1 week history of sharp stabbing left ear pain.  She states after the first 2 days of pain, she began having copious drainage particularly overnight when laying down for the next 3 days or so.  She filled a prescription for Augmentin that she had at home from her dentist and she started taking that 4 days ago.  She states the pain has improved but she still having muffled hearing.  No further drainage the past 2 days.  History of ruptured eardrum on the other side requiring repair years ago.    Past Medical History:  Diagnosis Date   Allergy    Anxiety    Aortic insufficiency    Arthritis    bulging discs   Asthma    Bradycardia    Burning chest pain    Cancer (HCC)    abnormal PAP   Depression    Ejection fraction    GERD (gastroesophageal reflux disease)    History of Nissen fundoplication   Hemorrhoids    High cholesterol    History of Nissen fundoplication    6384   IBS (irritable bowel syndrome)    Nodular thyroid disease 06/14/2020   Pelvic pain in female 12/05/2015   Pelvic relaxation 12/05/2015   Rectocele 12/05/2015   Seizures ([place])    per Dr Merlene Laughter   Skin tag 12/05/2015   TMJ (dislocation of temporomandibular joint)    Ventricular tachycardia ([place]) 08/21/2013   7 beats ventricular tachycardia, rate 180, 8 day event recorder, July 13, 2013  //   nuclear stress study normal, November, 2014, normal EF by 2-D and nuclear     Patient Active Problem List   Diagnosis Date Noted   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/12/2022   RLQ abdominal pain 12/27/2020   Nausea without vomiting 12/27/2020   Fatigue 12/27/2020   Esophageal dysphagia 12/27/2020   Nodular thyroid disease 06/14/2020   Thyroiditis 06/14/2020   Pelvic pain  01/08/2020   Pelvic relaxation due to cystocele, midline 01/08/2020   Excessive and redundant skin and subcutaneous tissue 01/08/2020   Vaginal atrophy 01/08/2020   Screening for colorectal cancer 01/08/2020   Seizure disorder ([place]) 05/28/2017   Generalized anxiety disorder 01/06/2017   HLD (hyperlipidemia) 12/01/2016   Osteopenia determined by x-ray 12/01/2016   Anxiety with somatization 11/02/2016   Ventral hernia 07/21/2016   Pelvic relaxation 12/05/2015   Pelvic pain in female 12/05/2015   Mucosal abnormality of stomach    Constipation 10/11/2014   Choledocholithiasis 10/11/2014    Class: Question of   IBS (irritable bowel syndrome)    History of Nissen fundoplication    RUQ pain 07/05/2013   GERD 02/04/2010    Past Surgical History:  Procedure Laterality Date   ABDOMINAL HYSTERECTOMY     ABDOMINAL SURGERY     hernia repair   BIOPSY  03/27/2021   Procedure: BIOPSY;  Surgeon: Daneil Dolin, MD;  [facility];  Service: Endoscopy;;   CERVICAL ABLATION     CHOLECYSTECTOMY     COLONOSCOPY  02/2010   Dr. Gala Romney: Normal terminal ileum to 10 cm, normal colonoscopy.   ESOPHAGOGASTRODUODENOSCOPY  04/14/2006   [id number] esophagus with esophagogastric junction at 36 cm from  the incisors.  Intact Nissen fundoplication, otherwise normal   ESOPHAGOGASTRODUODENOSCOPY  02/2010   Dr. Gala Romney: normal, status post intact Nissen Fundoplication, antral erosions and erythema with chronic gastritis but no H. pylori on biopsies.   ESOPHAGOGASTRODUODENOSCOPY N/A 12/03/2014   Dr.Rourk- intact fundoplication, antral erosions s/p bx. Bx= focally eroded reactive gastopathy negative for hpylori   ESOPHAGOGASTRODUODENOSCOPY (EGD) WITH PROPOFOL N/A 03/27/2021   Procedure: ESOPHAGOGASTRODUODENOSCOPY (EGD) WITH PROPOFOL;  Surgeon: Daneil Dolin, MD;  Location: [facility];  Service: Endoscopy;  Laterality: N/A;  Patient cannot come in any earlier.   ESOPHAGOGASTRODUODENOSCOPY  (EGD) WITH PROPOFOL N/A 02/19/2022   Procedure: ESOPHAGOGASTRODUODENOSCOPY (EGD) WITH PROPOFOL;  Surgeon: Daneil Dolin, MD;  [facility]: [facility];  Service: Endoscopy;  Laterality: N/A;  2:45pm   EXTERNAL EAR SURGERY     HERNIA REPAIR     MALONEY DILATION N/A 03/27/2021   Procedure: MALONEY DILATION;  Surgeon: Daneil Dolin, MD;  Location: [facility];  Service: Endoscopy;  Laterality: N/A;   NISSEN FUNDOPLICATION  8938   NISSEN FUNDOPLICATION     NOSE SURGERY     SHOULDER SURGERY     TONSILLECTOMY      OB History     Gravida  4   Para  3   Term      Preterm      AB  1   Living  3      SAB  1   IAB      Ectopic      Multiple      Live Births               Home Medications    Prior to Admission medications   Medication Sig Start Date End Date Taking? Authorizing Provider  ofloxacin (FLOXIN) 0.3 % OTIC solution Place 5 drops into the left ear 2 (two) times daily. 05/14/22  Yes Volney American, PA-C  acetaminophen (TYLENOL) 500 MG tablet Take 1,000 mg by mouth every 6 (six) hours as needed for pain.    [provider]  ARTIFICIAL TEAR SOLUTION OP Place 1 drop into both eyes daily as needed (dry eyes).    [provider]  aspirin EC 81 MG tablet Take 81 mg by mouth daily. Swallow whole.    [provider]  atorvastatin (LIPITOR) 10 MG tablet Take 10 mg by mouth daily.    [provider]  clonazePAM (KLONOPIN) 1 MG tablet Take 1 mg by mouth at bedtime.    [provider]  diphenhydrAMINE (BENADRYL) 25 MG tablet Take 25 mg by mouth daily as needed for allergies.    [provider]  DULoxetine (CYMBALTA) 20 MG capsule Take 20 mg by mouth daily. 06/11/20   [provider]  Fiber POWD Take 1 Scoop by mouth daily.    [provider]  lubiprostone (AMITIZA) 8 MCG capsule Take one capsule once to twice daily with food for constipation. Patient taking differently: Take 8 mcg by mouth  daily. 01/12/22   Mahala Menghini, PA-C  Multiple Minerals-Vitamins (CALCIUM-MAGNESIUM-ZINC-D3) TABS Take 1 tablet by mouth daily.    [provider]  Multiple Vitamins-Minerals (ADULT GUMMY PO) Take 2 capsules by mouth daily.    [provider]  multivitamin-lutein (OCUVITE-LUTEIN) CAPS capsule Take 1 capsule by mouth daily.    [provider]  oxymetazoline (AFRIN) 0.05 % nasal spray Place 1 spray into both nostrils 2 (two) times daily as needed for congestion.    [provider]  pantoprazole (PROTONIX) 40 MG tablet Take 40 mg by mouth at bedtime. Takes one a day, second dose only if needed. 12/24/21   [provider]  PROVENTIL HFA 108 (90 Base) MCG/ACT inhaler INHALE TWO PUFFS BY MOUTH EVERY 6 HOURS AS NEEDED FOR SHORTNESS OF BREATH Patient taking differently: Inhale 2 puffs into the lungs every 6 (six) hours as needed. 09/13/17   Raylene Everts, MD  tiZANidine (ZANAFLEX) 2 MG tablet Take 1 mg by mouth 2 (two) times daily as needed for muscle spasms. 07/08/20   [provider]    Family History Family History  Problem Relation Age of Onset   Lung cancer Father    Cancer Father        lung cancer   Irritable bowel syndrome Sister    Irritable bowel syndrome Sister    Hyperlipidemia Sister    Hypertension Sister    Vascular Disease Sister    Hyperlipidemia Sister    Hypertension Sister    Hyperlipidemia Mother    GER disease Mother    Irritable bowel syndrome Mother    Hypertension Brother    Hyperlipidemia Brother    Cancer Brother        lung   Hypertension Son    Hyperlipidemia Son    Stroke Maternal Grandmother    Diabetes Maternal Grandmother    Hypertension Maternal Grandmother    Hyperlipidemia Maternal Grandmother    Hypertension Maternal Grandfather    Heart attack Maternal Grandfather    Diabetes Paternal Grandmother    Hyperlipidemia Paternal Grandmother    Hypertension Paternal Grandmother    Kidney disease  Paternal Grandmother    Hypertension Paternal Grandfather    Hyperlipidemia Paternal Grandfather    Heart attack Paternal Grandfather    Diabetes Sister    Hypertension Son    Cancer Paternal Aunt        ovarian    Social History Social History   Tobacco Use   Smoking status: Never   Smokeless tobacco: Never  Vaping Use   Vaping Use: Never used  Substance Use Topics   Alcohol use: Yes    Comment: occ glass of red wine, 01-06-2017 RARELY   Drug use: Yes    Types: Marijuana    Comment: occ. 01-06-2017 PER PT [facility]Y [facility] PAIN [facility]     Allergies   Gluten meal, Lactose intolerance (gi), Ciprofloxacin, Codeine, Meperidine hcl, Metaxalone, Naproxen, Neurontin [gabapentin], Simvastatin, Sulfa antibiotics, Tizanidine, and Trazodone and nefazodone   Review of Systems Review of Systems Per HPI  Physical Exam Triage Vital Signs ED Triage Vitals  Enc Vitals Group     BP 05/14/22 1426 (!) 154/75     Pulse Rate 05/14/22 1426 73     Resp 05/14/22 1426 20     Temp 05/14/22 1426 98.5 F (36.9 C)     Temp Source 05/14/22 1426 Oral     SpO2 05/14/22 1426 98 %     Weight --      Height --      Head Circumference --      Peak Flow --      Pain Score 05/14/22 1427 8     Pain Loc --      Pain Edu? --      Excl. in [place]? --    No data found.  Updated Vital Signs BP (!) 154/75 (BP Location: Right Arm)   Pulse 73   Temp 98.5 F (36.9 C) (Oral)  Resp 20   SpO2 98%   Visual Acuity Right Eye Distance:   Left Eye Distance:   Bilateral Distance:    Right Eye Near:   Left Eye Near:    Bilateral Near:     Physical Exam Vitals and nursing note reviewed.  Constitutional:      Appearance: Normal appearance. She is not ill-appearing.  HENT:     Head: Atraumatic.     Ears:     Comments: Chronic TM scarring, effusion of the left ear.  There is small perforation of the left TM anteriorly.  No active drainage, erythema    Nose: Nose normal.     Mouth/Throat:     Mouth:  Mucous membranes are moist.     Pharynx: Oropharynx is clear.  Eyes:     Extraocular Movements: Extraocular movements intact.     Conjunctiva/sclera: Conjunctivae normal.  Cardiovascular:     Rate and Rhythm: Normal rate and regular rhythm.     Heart sounds: Normal heart sounds.  Pulmonary:     Effort: Pulmonary effort is normal.     Breath sounds: Normal breath sounds.  Musculoskeletal:        General: Normal range of motion.     Cervical back: Normal range of motion and neck supple.  Skin:    General: Skin is warm and dry.  Neurological:     Mental Status: She is alert and oriented to person, place, and time.  Psychiatric:        Mood and Affect: Mood normal.        Thought Content: Thought content normal.        Judgment: Judgment normal.      UC Treatments / Results  Labs (all labs ordered are listed, but only abnormal results are displayed) Labs Reviewed - No data to display  EKG   Radiology No results found.  Procedures Procedures (including critical care time)  Medications Ordered in UC Medications - No data to display  Initial Impression / Assessment and Plan / UC Course  I have reviewed the triage vital signs and the nursing notes.  Pertinent labs & imaging results that were available during my care of the patient were reviewed by me and considered in my medical decision making (see chart for details).     Treat with ofloxacin drops, ear nose and throat follow-up for recheck.  Return for worsening symptoms.  Final Clinical Impressions(s) / UC Diagnoses   Final diagnoses:  Acute serous otitis media of left ear, recurrence not specified  Perforation of left tympanic membrane   Discharge Instructions   None    ED Prescriptions     Medication Sig Dispense Auth. Provider   ofloxacin (FLOXIN) 0.3 % OTIC solution Place 5 drops into the left ear 2 (two) times daily. 5 mL Volney American, [place]      PDMP not reviewed this encounter.   Volney American, [place] 05/14/22 1556

## 2022-05-26 ENCOUNTER — Ambulatory Visit: Payer: Medicare Other | Admitting: Gastroenterology

## 2022-06-02 ENCOUNTER — Encounter (HOSPITAL_COMMUNITY): Payer: Self-pay

## 2022-06-02 ENCOUNTER — Ambulatory Visit (HOSPITAL_COMMUNITY)
Admission: RE | Admit: 2022-06-02 | Discharge: 2022-06-02 | Disposition: A | Discharge status: Home / Self Care | Payer: Medicare Other | Source: Ambulatory Visit | Attending: Family Medicine | Admitting: Family Medicine

## 2022-06-02 DIAGNOSIS — R921 Mammographic calcification found on diagnostic imaging of breast: Secondary | ICD-10-CM | POA: Diagnosis present

## 2022-06-17 ENCOUNTER — Encounter: Payer: Self-pay | Admitting: Gastroenterology

## 2022-07-01 ENCOUNTER — Ambulatory Visit: Payer: Medicare Other | Admitting: Gastroenterology

## 2022-07-13 ENCOUNTER — Encounter: Payer: Self-pay | Admitting: Gastroenterology

## 2022-07-13 ENCOUNTER — Ambulatory Visit (INDEPENDENT_AMBULATORY_CARE_PROVIDER_SITE_OTHER): Payer: Medicare Other | Admitting: Gastroenterology

## 2022-07-13 VITALS — BP 147/73 | HR 59 | Temp 98.0°F | Ht 62.5 in | Wt 146.2 lb

## 2022-07-13 DIAGNOSIS — R1031 Right lower quadrant pain: Secondary | ICD-10-CM

## 2022-07-13 DIAGNOSIS — K6289 Other specified diseases of anus and rectum: Secondary | ICD-10-CM

## 2022-07-13 DIAGNOSIS — K5900 Constipation, unspecified: Secondary | ICD-10-CM

## 2022-07-13 MED ORDER — HYDROCORTISONE (PERIANAL) 2.5 % EX CREA: 1.0000 | TOPICAL_CREAM | Freq: Two times a day (BID) | CUTANEOUS | 1 refills | Status: DC

## 2022-07-13 NOTE — Patient Instructions (Addendum)
Increase Amitiza to 57mg twice daily with food. Hopefully your bowels will move easier. You need to avoid sitting on the toilet for more than 2-3 minutes at a time and avoid straining.  If your bowels do not move better with increasing your amitiza to twice daily, please let me know. Try anusol cream apply just inside the rectum twice daily for 14 days at a time. Avoid long term use.  Call in 2 weeks and let me know if your abdominal pain, constipation, rectal pain have improved.

## 2022-07-13 NOTE — Progress Notes (Signed)
GI Office Note    Referring Provider: Leeanne Rio, MD Primary Care Physician:  Leeanne Rio, MD  Primary Gastroenterologist: Garfield Cornea, MD   Chief Complaint   Chief Complaint  Patient presents with   Rectal Pain    Been experiencing pain in her rectum since having a bpe.     History of Present Illness   Destiny Young is a 66 y.o. female presenting today for follow-up.  Last seen in March 2023.History of remote fundoplication in 7782, converted to Toupet fundoplication in 4235 at time of cholecystectomy, also with ventral hernia repair at that time.  EGD in June 2022 with possible stenotic fundoplication status post dilation of the esophagus.  Multiple large gastric ulcers likely NSAID related.  No H. pylori.  Advised to have 58-monthsurveillance EGD.  Completed EGD April 2023, gastric ulcers had healed.  Due to persistent dysphagia she completed barium pill esophagram May 2023 showing small epiphrenic distal esophageal diverticulum, intact Nissen fundoplication, mild age-related dysmotility.  Barium tablet passed without any issues.  History of constipation.  Cologuard negative in December 2022.  Today: complains of rectal pain since her BPE in 02/2022. She has chronic constipation, typically has BM every 3 days. Has to strain a lot. Has history of rectocele and notes pressure due to this. She is taking Amitiza 8 mg daily. Did not tolerate twice daily or higher dose because she felt like she needed to have a BM all the time. Denies melena, brbpr. Hurts to sit on her bottom. Pain is persistent. Uses prepH when has a lot of pain but not daily. [place] 3 mostly. Sometimes bristol 5. Used MOM the other day and it took two days to work. Usually works within hours.  She has some cramping abdominal pain at times. Feels nauseated with it. Possible worse with constipation. Typically in RLQ but sometimes LLQ. No urinary symptoms.   No UGI sx now.   EGD April  2023: -Esophageal diverticulum. Intact fundoplication; previously noted ulcers completely healed Normal duodenal bulb and second portion of the duodenum. - No specimens collected.  EGD June 2022: -Normal esophagus. Snug fundoplication?status post dilation. Multiple large gastric ulcer - status post biopsy -Normal duodenal bulb and s snugecond portion of the duodenum -Gastric biopsy showed mild chronic gastritis with ulceration.  No H. pylori.  No intestinal metaplasia. -Recommended 366-monthurveillance EGD -It was felt this was likely due to NSAIDs use.   EGD 2016 with intact fundoplication and non H.pylori gastric erosions.  GES (2 hour) 2017 normal with 4.8% retention at 120 minutes.   MRCP 2016 with gallstones but no choledocholithiasis. Colonoscopy 2011 normal colon and TI. Cologuard December 2022: Negative   Medications   Current Outpatient Medications  Medication Sig Dispense Refill   acetaminophen (TYLENOL) 500 MG tablet Take 1,000 mg by mouth every 6 (six) hours as needed for pain.     ARTIFICIAL TEAR SOLUTION OP Place 1 drop into both eyes daily as needed (dry eyes).     aspirin EC 81 MG tablet Take 81 mg by mouth daily. Swallow whole.     atorvastatin (LIPITOR) 10 MG tablet Take 10 mg by mouth daily.     clonazePAM (KLONOPIN) 1 MG tablet Take 1 mg by mouth at bedtime.     diphenhydrAMINE (BENADRYL) 25 MG tablet Take 25 mg by mouth daily as needed for allergies.     DULoxetine (CYMBALTA) 20 MG capsule Take 20 mg by mouth daily.  lubiprostone (AMITIZA) 8 MCG capsule Take one capsule once to twice daily with food for constipation. (Patient taking differently: Take 8 mcg by mouth daily.) 60 capsule 5   Multiple Minerals-Vitamins (CALCIUM-MAGNESIUM-ZINC-D3) TABS Take 1 tablet by mouth daily.     Multiple Vitamins-Minerals (ADULT GUMMY PO) Take 2 capsules by mouth daily.     multivitamin-lutein (OCUVITE-LUTEIN) CAPS capsule Take 1 capsule by mouth daily.     OVER THE  COUNTER MEDICATION Take 2 Units by mouth daily. Probiotic/prebiotic gummy     oxymetazoline (AFRIN) 0.05 % nasal spray Place 1 spray into both nostrils 2 (two) times daily as needed for congestion.     pantoprazole (PROTONIX) 40 MG tablet Take 40 mg by mouth at bedtime. Takes one a day, second dose only if needed.     PROVENTIL HFA 108 (90 Base) MCG/ACT inhaler INHALE TWO PUFFS BY MOUTH EVERY 6 HOURS AS NEEDED FOR SHORTNESS OF BREATH (Patient taking differently: Inhale 2 puffs into the lungs every 6 (six) hours as needed.) 18 g 1   No current facility-administered medications for this visit.    Allergies   Allergies as of 07/13/2022 - Review Complete 07/13/2022  Allergen Reaction Noted   Gluten meal Anaphylaxis and Shortness Of Breath 03/25/2021   Lactose  03/25/2021   Lactose intolerance (gi)  03/25/2021   Ciprofloxacin Other (See Comments)    Codeine Nausea Only    Meperidine hcl Nausea And Vomiting    Metaxalone Nausea Only    Naproxen Nausea Only 07/29/2012   Neurontin [gabapentin] Other (See Comments) 12/05/2015   Simvastatin Other (See Comments) 08/21/2013   Sulfa antibiotics Itching    Tizanidine Other (See Comments) 07/05/2013   Trazodone and nefazodone Other (See Comments) 01/27/2017       Review of Systems   General: Negative for anorexia, weight loss, fever, chills, fatigue, weakness. ENT: Negative for hoarseness, difficulty swallowing, nasal congestion. CV: Negative for chest pain, angina, palpitations, dyspnea on exertion, peripheral edema.  Respiratory: Negative for dyspnea at rest, dyspnea on exertion, cough, sputum, wheezing.  GI: See history of present illness. GU:  Negative for dysuria, hematuria, urinary incontinence, urinary frequency, nocturnal urination.  Endo: Negative for unusual weight change.     Physical Exam   BP (!) 147/73 (BP Location: Right Arm, Patient Position: Sitting, Cuff Size: Normal)   Pulse (!) 59   Temp 98 F (36.7 C) (Temporal)   Ht  5' 2.5" (1.588 m)   Wt 146 lb 3.2 oz (66.3 kg)   SpO2 93%   BMI 26.31 kg/m    General: Well-nourished, well-developed in no acute distress.  Eyes: No icterus. Mouth: Oropharyngeal mucosa moist and pink , no lesions erythema or exudate. Lungs: Clear to auscultation bilaterally.  Heart: Regular rate and rhythm, no murmurs rubs or gallops.  Abdomen: Bowel sounds are normal, nondistended, no hepatosplenomegaly or masses,  no abdominal bruits or hernia , no rebound or guarding. Mild right mid to lower tenderness with palpation.  Rectal: significant hemorrhoid tissue noted externally, no erythema or bleeding. No thrombosis. Inside anal canal is palpable circumferential tissue likely hemorrhoids. No blood noted. Heme neg stool. No significant stool present. Exam nontender.  Extremities: No lower extremity edema. No clubbing or deformities. Neuro: Alert and oriented x 4   Skin: Warm and dry, no jaundice.   Psych: Alert and cooperative, normal mood and affect.  Labs   Not performed  Imaging Studies   No results found.  Assessment   Constipation: poorly controlled on Amitiza  51mg daily. Likely contributing to her rectal pain, due to straining and hard stools. Also likely contributing to her abdominal pain/cramping. Manage constipation, if persistent abdominal pain, then further work up will be needed.   Rectal pain: likely due to significant hemorrhoidal disease. Less likely anorectal fissure based on today's exam. For definitive treatment, she may require surgical intervention, but would be reasonable to see if we can get her to her baseline with limited flare of her disease.    PLAN   Increase Amitiza to 831m BID with food. May need further adjustment or switch to different agent if does not respond.  Trial of anusol cream bid to anorectum to help with pain/flare. I suspect some of her issue is due to significant redundant tissue externally that is painful with sitting. She may require  surgery referral for excision.  Call in two weeks with progress report on constipation, rectal pain, abdominal pain.    LeLaureen OchsLeBobby RumpfMHAlvaradoPAChicagoastroenterology Associates

## 2022-11-02 ENCOUNTER — Ambulatory Visit: Payer: Medicare Other | Admitting: Gastroenterology

## 2022-11-11 ENCOUNTER — Other Ambulatory Visit: Payer: Self-pay | Admitting: *Deleted

## 2022-11-11 DIAGNOSIS — D1721 Benign lipomatous neoplasm of skin and subcutaneous tissue of right arm: Secondary | ICD-10-CM

## 2022-11-17 ENCOUNTER — Encounter: Payer: Self-pay | Admitting: General Surgery

## 2022-11-17 ENCOUNTER — Ambulatory Visit (INDEPENDENT_AMBULATORY_CARE_PROVIDER_SITE_OTHER): Payer: 59 | Admitting: General Surgery

## 2022-11-17 VITALS — BP 121/64 | HR 63 | Temp 98.2°F | Resp 12 | Ht 62.5 in | Wt 148.0 lb

## 2022-11-17 DIAGNOSIS — R229 Localized swelling, mass and lump, unspecified: Secondary | ICD-10-CM | POA: Diagnosis not present

## 2022-11-17 NOTE — Progress Notes (Signed)
Destiny Young; [id number]; [birth year]   HPI Patient is a 67 year old white female who was referred to my care by Dr. 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  She had a lipoma removed from her upper right posterior chest wall in 2000.  She has been recently having issues with right shoulder pain and numbness traveling down her right arm.  She has had her neck evaluated but she was told she did not need any surgical intervention.  She thinks there is a swelling just below the surgical scar from the previous lipoma removal. Past Medical History:  Diagnosis Date   Allergy    Anxiety    Aortic insufficiency    Arthritis    bulging discs   Asthma    Bradycardia    Burning chest pain    Cancer (HCC)    abnormal PAP   Depression    Ejection fraction    GERD (gastroesophageal reflux disease)    History of Nissen fundoplication   Hemorrhoids    High cholesterol    History of Nissen fundoplication    9833   IBS (irritable bowel syndrome)    Nodular thyroid disease 06/14/2020   Pelvic pain in female 12/05/2015   Pelvic relaxation 12/05/2015   Rectocele 12/05/2015   Seizures ([place])    per Dr Merlene Laughter   Skin tag 12/05/2015   TMJ (dislocation of temporomandibular joint)    Ventricular tachycardia ([place]) 08/21/2013   7 beats ventricular tachycardia, rate 180, 8 day event recorder, July 13, 2013  //   nuclear stress study normal, November, 2014, normal EF by 2-D and nuclear     Past Surgical History:  Procedure Laterality Date   ABDOMINAL HYSTERECTOMY     ABDOMINAL SURGERY     hernia repair   BIOPSY  03/27/2021   Procedure: BIOPSY;  Surgeon: Daneil Dolin, MD;  [facility];  Service: Endoscopy;;   CERVICAL ABLATION     CHOLECYSTECTOMY     COLONOSCOPY  02/2010   Dr. Gala Romney: Normal terminal ileum to 10 cm, normal colonoscopy.   ESOPHAGOGASTRODUODENOSCOPY  04/14/2006   [id number] esophagus with esophagogastric junction  at 36 cm from the incisors.  Intact Nissen fundoplication, otherwise normal   ESOPHAGOGASTRODUODENOSCOPY  02/2010   Dr. Gala Romney: normal, status post intact Nissen Fundoplication, antral erosions and erythema with chronic gastritis but no H. pylori on biopsies.   ESOPHAGOGASTRODUODENOSCOPY N/A 12/03/2014   Dr.Rourk- intact fundoplication, antral erosions s/p bx. Bx= focally eroded reactive gastopathy negative for hpylori   ESOPHAGOGASTRODUODENOSCOPY (EGD) WITH PROPOFOL N/A 03/27/2021   Procedure: ESOPHAGOGASTRODUODENOSCOPY (EGD) WITH PROPOFOL;  Surgeon: Daneil Dolin, MD;  [facility];  Service: Endoscopy;  Laterality: N/A;  Patient cannot come in any earlier.   ESOPHAGOGASTRODUODENOSCOPY (EGD) WITH PROPOFOL N/A 02/19/2022   Procedure: ESOPHAGOGASTRODUODENOSCOPY (EGD) WITH PROPOFOL;  Surgeon: Daneil Dolin, MD;  [facility];  Service: Endoscopy;  Laterality: N/A;  2:45pm   EXTERNAL EAR SURGERY     HERNIA REPAIR     MALONEY DILATION N/A 03/27/2021   Procedure: MALONEY DILATION;  Surgeon: Daneil Dolin, MD;  [facility];  Service: Endoscopy;  Laterality: N/A;   NISSEN FUNDOPLICATION  2409   NISSEN FUNDOPLICATION     NOSE SURGERY     SHOULDER SURGERY     TONSILLECTOMY      Family History  Problem Relation Age of Onset   Lung cancer Father    Cancer Father  lung cancer   Irritable bowel syndrome Sister    Irritable bowel syndrome Sister    Hyperlipidemia Sister    Hypertension Sister    Vascular Disease Sister    Hyperlipidemia Sister    Hypertension Sister    Hyperlipidemia Mother    GER disease Mother    Irritable bowel syndrome Mother    Hypertension Brother    Hyperlipidemia Brother    Cancer Brother        lung   Hypertension Son    Hyperlipidemia Son    Stroke Maternal Grandmother    Diabetes Maternal Grandmother    Hypertension Maternal Grandmother    Hyperlipidemia Maternal Grandmother    Hypertension Maternal Grandfather     Heart attack Maternal Grandfather    Diabetes Paternal Grandmother    Hyperlipidemia Paternal Grandmother    Hypertension Paternal Grandmother    Kidney disease Paternal Grandmother    Hypertension Paternal Grandfather    Hyperlipidemia Paternal Grandfather    Heart attack Paternal Grandfather    Diabetes Sister    Hypertension Son    Cancer Paternal Aunt        ovarian    Current Outpatient Medications on File Prior to Visit  Medication Sig Dispense Refill   acetaminophen (TYLENOL) 500 MG tablet Take 1,000 mg by mouth every 6 (six) hours as needed for pain.     ARTIFICIAL TEAR SOLUTION OP Place 1 drop into both eyes daily as needed (dry eyes).     aspirin EC 81 MG tablet Take 81 mg by mouth daily. Swallow whole.     atorvastatin (LIPITOR) 10 MG tablet Take 10 mg by mouth daily.     clonazePAM (KLONOPIN) 1 MG tablet Take 1 mg by mouth at bedtime.     diphenhydrAMINE (BENADRYL) 25 MG tablet Take 25 mg by mouth daily as needed for allergies.     DULoxetine (CYMBALTA) 20 MG capsule Take 20 mg by mouth daily.     hydrocortisone (ANUSOL-HC) 2.5 % rectal cream Place 1 Application rectally 2 (two) times daily. For 14 days. 30 g 1   lubiprostone (AMITIZA) 8 MCG capsule Take one capsule once to twice daily with food for constipation. (Patient taking differently: Take 8 mcg by mouth daily.) 60 capsule 5   Midazolam (NAYZILAM) 5 MG/0.1ML SOLN Place 1 each into the nose as needed (For seizure cluster).     Multiple Minerals-Vitamins (CALCIUM-MAGNESIUM-ZINC-D3) TABS Take 1 tablet by mouth daily.     Multiple Vitamins-Minerals (ADULT GUMMY PO) Take 2 capsules by mouth daily.     multivitamin-lutein (OCUVITE-LUTEIN) CAPS capsule Take 1 capsule by mouth daily.     omeprazole (PRILOSEC) 20 MG capsule Take 20 mg by mouth daily.     OVER THE COUNTER MEDICATION Take 2 Units by mouth daily. Probiotic/prebiotic gummy     PROVENTIL HFA 108 (90 Base) MCG/ACT inhaler INHALE TWO PUFFS BY MOUTH EVERY 6 HOURS  AS NEEDED FOR SHORTNESS OF BREATH (Patient taking differently: Inhale 2 puffs into the lungs every 6 (six) hours as needed.) 18 g 1   No current facility-administered medications on file prior to visit.    Allergies  Allergen Reactions   Gluten Meal Anaphylaxis and Shortness Of Breath    Throat feels like it closes    Lactose     Bloated, upset stomach   Lactose Intolerance (Gi)     Bloated, upset stomach   Ciprofloxacin Other (See Comments)    "Skin burning"- feeling, not allergy   Codeine Nausea  Only    Nausea and sweats   Meperidine Hcl Nausea And Vomiting    GI intolerance   Metaxalone Nausea Only    Stomach Irritation    Naproxen Nausea Only    Stomach Irritation    Neurontin [Gabapentin] Other (See Comments)    Blurred vision, funny feeling in head- side effect not allergy   Simvastatin Other (See Comments)    Muscle aches in legs, fatigue- not allergic   Sulfa Antibiotics Itching    Skin Irritation (Burning, Stinging)    Tizanidine Other (See Comments)    In higher doses Abdominal/chest pain/ "too relaxed", tolerates low doses     Trazodone And Nefazodone Other (See Comments)    Pt stated, "felt like had a little seizure; eyes started jumping; felt weirdness in top of head; sweating and felt sick - clammy and dizziness"    Social History   Substance and Sexual Activity  Alcohol Use Yes   Comment: occ glass of red wine, 01-06-2017 RARELY    Social History   Tobacco Use  Smoking Status Never  Smokeless Tobacco Never    Review of Systems  Constitutional:  Positive for malaise/fatigue.  HENT:  Positive for sinus pain.   Eyes:  Positive for blurred vision and pain.  Respiratory:  Positive for shortness of breath.   Cardiovascular:  Positive for chest pain.  Gastrointestinal:  Positive for abdominal pain and nausea. Negative for heartburn.  Genitourinary: Negative.   Musculoskeletal:  Positive for back pain, joint pain and neck pain.  Skin: Negative.    Neurological:  Positive for tingling and sensory change.  Endo/Heme/Allergies: Negative.   Psychiatric/Behavioral: Negative.      Objective   Vitals:   11/17/22 1540  BP: 121/64  Pulse: 63  Resp: 12  Temp: 98.2 F (36.8 C)  SpO2: 97%    Physical Exam Vitals reviewed.  Constitutional:      Appearance: Normal appearance. She is not ill-appearing.  HENT:     Head: Normocephalic and atraumatic.  Cardiovascular:     Rate and Rhythm: Normal rate and regular rhythm.     Heart sounds: Normal heart sounds. No murmur heard.    No friction rub. No gallop.  Pulmonary:     Effort: Pulmonary effort is normal. No respiratory distress.     Breath sounds: Normal breath sounds. No stridor. No wheezing, rhonchi or rales.     Comments: A retracted surgical scar is noted in the right posterior mid chest wall with minimal swelling inferior to this.  There is no distinct subcutaneous mass present.  This appears to be normal subcutaneous tissue. Skin:    General: Skin is warm and dry.  Neurological:     Mental Status: She is alert and oriented to person, place, and time.     Assessment  Soft tissue density, benign.  This is not a lipoma. Plan  I told the patient that her symptoms are unrelated to the previously removed lipoma.  She does not have a recurrent lipoma.  She does not need any surgical excision from my standpoint.  She understands and agrees.  Follow-up here as needed.

## 2022-11-23 DIAGNOSIS — H90A11 Conductive hearing loss, unilateral, right ear with restricted hearing on the contralateral side: Secondary | ICD-10-CM | POA: Diagnosis not present

## 2022-12-02 ENCOUNTER — Other Ambulatory Visit: Payer: Self-pay | Admitting: Physician Assistant

## 2022-12-02 DIAGNOSIS — H918X3 Other specified hearing loss, bilateral: Secondary | ICD-10-CM

## 2022-12-02 DIAGNOSIS — R42 Dizziness and giddiness: Secondary | ICD-10-CM

## 2022-12-24 ENCOUNTER — Other Ambulatory Visit: Payer: Self-pay | Admitting: Internal Medicine

## 2022-12-24 NOTE — Telephone Encounter (Signed)
She also has omeprazole on her list. Did she switch? Just don't want her on both.

## 2022-12-25 NOTE — Telephone Encounter (Signed)
No ans, no vm.

## 2022-12-28 ENCOUNTER — Other Ambulatory Visit: Payer: 59

## 2022-12-28 NOTE — Telephone Encounter (Signed)
Patient states that she is taking the pantoprazole and not the omeprazole.

## 2022-12-30 DIAGNOSIS — R928 Other abnormal and inconclusive findings on diagnostic imaging of breast: Secondary | ICD-10-CM | POA: Diagnosis not present

## 2022-12-30 DIAGNOSIS — R921 Mammographic calcification found on diagnostic imaging of breast: Secondary | ICD-10-CM | POA: Diagnosis not present

## 2023-01-15 ENCOUNTER — Ambulatory Visit
Admission: RE | Admit: 2023-01-15 | Discharge: 2023-01-15 | Disposition: A | Discharge status: Home / Self Care | Payer: 59 | Source: Ambulatory Visit | Attending: Physician Assistant | Admitting: Physician Assistant

## 2023-01-15 DIAGNOSIS — H918X3 Other specified hearing loss, bilateral: Secondary | ICD-10-CM | POA: Diagnosis not present

## 2023-01-15 DIAGNOSIS — R42 Dizziness and giddiness: Secondary | ICD-10-CM

## 2023-01-15 MED ORDER — GADOPICLENOL 0.5 MMOL/ML IV SOLN
7.0000 mL | Freq: Once | INTRAVENOUS | Status: AC | PRN
  Administered 2023-01-15: 7 mL via INTRAVENOUS

## 2023-01-18 DIAGNOSIS — M47812 Spondylosis without myelopathy or radiculopathy, cervical region: Secondary | ICD-10-CM | POA: Diagnosis not present

## 2023-01-18 DIAGNOSIS — M797 Fibromyalgia: Secondary | ICD-10-CM | POA: Diagnosis not present

## 2023-01-18 DIAGNOSIS — M47816 Spondylosis without myelopathy or radiculopathy, lumbar region: Secondary | ICD-10-CM | POA: Diagnosis not present

## 2023-02-12 DIAGNOSIS — J019 Acute sinusitis, unspecified: Secondary | ICD-10-CM | POA: Diagnosis not present

## 2023-02-19 ENCOUNTER — Ambulatory Visit: Payer: 59 | Admitting: Gastroenterology

## 2023-02-19 DIAGNOSIS — R5381 Other malaise: Secondary | ICD-10-CM | POA: Diagnosis not present

## 2023-02-25 NOTE — Progress Notes (Deleted)
GI Office Note    Referring Provider: Catalina Lunger, DO Primary Care Physician:  Catalina Lunger, DO  Primary Gastroenterologist: Roetta Sessions, MD   Chief Complaint   No chief complaint on file.   History of Present Illness   Destiny Young is a 67 y.o. female presenting today for follow-up.  Last seen in September 2023.  She has a history of remote fundoplication in 2001,converted to Toupet fundoplication in 2018 at time of cholecystectomy, also with ventral hernia repair at that time.   EGD in June 2022 with possible stenotic fundoplication status post dilation of the esophagus.  Multiple large gastric ulcers likely NSAID related.  No H. pylori.  Advised to have 18-month surveillance EGD.  Completed EGD April 2023, gastric ulcers had healed.  Due to persistent dysphagia she completed barium pill esophagram May 2023 showing small epiphrenic distal esophageal diverticulum, intact Nissen fundoplication, mild age-related dysmotility.  Barium tablet passed without any issues.      EGD April 2023: -Esophageal diverticulum. Intact fundoplication; previously noted ulcers completely healed Normal duodenal bulb and second portion of the duodenum. - No specimens collected.   EGD June 2022: -Normal esophagus. Snug fundoplication?status post dilation. Multiple large gastric ulcer - status post biopsy -Normal duodenal bulb and s snugecond portion of the duodenum -Gastric biopsy showed mild chronic gastritis with ulceration.  No H. pylori.  No intestinal metaplasia. -Recommended 41-month surveillance EGD -It was felt this was likely due to NSAIDs use.   EGD 2016 with intact fundoplication and non H.pylori gastric erosions.  GES (2 hour) 2017 normal with 4.8% retention at 120 minutes.   MRCP 2016 with gallstones but no choledocholithiasis. Colonoscopy 2011 normal colon and TI. Cologuard December 2022: Negative     Medications   Current Outpatient Medications  Medication Sig  Dispense Refill   acetaminophen (TYLENOL) 500 MG tablet Take 1,000 mg by mouth every 6 (six) hours as needed for pain.     ARTIFICIAL TEAR SOLUTION OP Place 1 drop into both eyes daily as needed (dry eyes).     aspirin EC 81 MG tablet Take 81 mg by mouth daily. Swallow whole.     atorvastatin (LIPITOR) 10 MG tablet Take 10 mg by mouth daily.     clonazePAM (KLONOPIN) 1 MG tablet Take 1 mg by mouth at bedtime.     diphenhydrAMINE (BENADRYL) 25 MG tablet Take 25 mg by mouth daily as needed for allergies.     DULoxetine (CYMBALTA) 20 MG capsule Take 20 mg by mouth daily.     hydrocortisone (ANUSOL-HC) 2.5 % rectal cream Place 1 Application rectally 2 (two) times daily. For 14 days. 30 g 1   lubiprostone (AMITIZA) 8 MCG capsule Take one capsule once to twice daily with food for constipation. (Patient taking differently: Take 8 mcg by mouth daily.) 60 capsule 5   Midazolam (NAYZILAM) 5 MG/0.1ML SOLN Place 1 each into the nose as needed (For seizure cluster).     Multiple Minerals-Vitamins (CALCIUM-MAGNESIUM-ZINC-D3) TABS Take 1 tablet by mouth daily.     Multiple Vitamins-Minerals (ADULT GUMMY PO) Take 2 capsules by mouth daily.     multivitamin-lutein (OCUVITE-LUTEIN) CAPS capsule Take 1 capsule by mouth daily.     OVER THE COUNTER MEDICATION Take 2 Units by mouth daily. Probiotic/prebiotic gummy     pantoprazole (PROTONIX) 40 MG tablet Take one tablet once to twice daily before a meal as needed. 180 tablet 3   PROVENTIL HFA 108 (90 Base) MCG/ACT inhaler INHALE  TWO PUFFS BY MOUTH EVERY 6 HOURS AS NEEDED FOR SHORTNESS OF BREATH (Patient taking differently: Inhale 2 puffs into the lungs every 6 (six) hours as needed.) 18 g 1   No current facility-administered medications for this visit.    Allergies   Allergies as of 02/26/2023 - Review Complete 11/17/2022  Allergen Reaction Noted   Gluten meal Anaphylaxis and Shortness Of Breath 03/25/2021   Lactose  03/25/2021   Lactose intolerance (gi)   03/25/2021   Ciprofloxacin Other (See Comments)    Codeine Nausea Only    Meperidine hcl Nausea And Vomiting    Metaxalone Nausea Only    Naproxen Nausea Only 07/29/2012   Neurontin [gabapentin] Other (See Comments) 12/05/2015   Simvastatin Other (See Comments) 08/21/2013   Sulfa antibiotics Itching    Tizanidine Other (See Comments) 07/05/2013   Trazodone and nefazodone Other (See Comments) 01/27/2017     Past Medical History   Past Medical History:  Diagnosis Date   Allergy    Anxiety    Aortic insufficiency    Arthritis    bulging discs   Asthma    Bradycardia    Burning chest pain    Cancer (HCC)    abnormal PAP   Depression    Ejection fraction    GERD (gastroesophageal reflux disease)    History of Nissen fundoplication   Hemorrhoids    High cholesterol    History of Nissen fundoplication    2000   IBS (irritable bowel syndrome)    Nodular thyroid disease 06/14/2020   Pelvic pain in female 12/05/2015   Pelvic relaxation 12/05/2015   Rectocele 12/05/2015   Seizures ([facility]CC)    per Dr Gerilyn Pilgrim   Skin tag 12/05/2015   TMJ (dislocation of temporomandibular joint)    Ventricular tachycardia ([facility]) 08/21/2013   7 beats ventricular tachycardia, rate 180, 8 day event recorder, July 13, 2013  //   nuclear stress study normal, November, 2014, normal EF by 2-D and nuclear     Past Surgical History   Past Surgical History:  Procedure Laterality Date   ABDOMINAL HYSTERECTOMY     ABDOMINAL SURGERY     hernia repair   BIOPSY  03/27/2021   Procedure: BIOPSY;  Surgeon: Corbin Ade, MD;  [facility];  Service: Endoscopy;;   CERVICAL ABLATION     CHOLECYSTECTOMY     COLONOSCOPY  02/2010   Dr. Jena Gauss: Normal terminal ileum to 10 cm, normal colonoscopy.   ESOPHAGOGASTRODUODENOSCOPY  04/14/2006   [id number] esophagus with esophagogastric junction at 36 cm from the incisors.  Intact Nissen fundoplication, otherwise normal    ESOPHAGOGASTRODUODENOSCOPY  02/2010   Dr. Jena Gauss: normal, status post intact Nissen Fundoplication, antral erosions and erythema with chronic gastritis but no H. pylori on biopsies.   ESOPHAGOGASTRODUODENOSCOPY N/A 12/03/2014   Dr.Rourk- intact fundoplication, antral erosions s/p bx. Bx= focally eroded reactive gastopathy negative for hpylori   ESOPHAGOGASTRODUODENOSCOPY (EGD) WITH PROPOFOL N/A 03/27/2021   Procedure: ESOPHAGOGASTRODUODENOSCOPY (EGD) WITH PROPOFOL;  Surgeon: Corbin Ade, MD;  [facility];  Service: Endoscopy;  Laterality: N/A;  Patient cannot come in any earlier.   ESOPHAGOGASTRODUODENOSCOPY (EGD) WITH PROPOFOL N/A 02/19/2022   Procedure: ESOPHAGOGASTRODUODENOSCOPY (EGD) WITH PROPOFOL;  Surgeon: Corbin Ade, MD;  [facility];  Service: Endoscopy;  Laterality: N/A;  2:45pm   EXTERNAL EAR SURGERY     HERNIA REPAIR     MALONEY DILATION N/A 03/27/2021   Procedure: Elease Hashimoto DILATION;  Surgeon: Corbin Ade,  MD;  [facility];  Service: Endoscopy;  Laterality: N/A;   NISSEN FUNDOPLICATION  2000   NISSEN FUNDOPLICATION     NOSE SURGERY     SHOULDER SURGERY     TONSILLECTOMY      Past Family History   Family History  Problem Relation Age of Onset   Lung cancer Father    Cancer Father        lung cancer   Irritable bowel syndrome Sister    Irritable bowel syndrome Sister    Hyperlipidemia Sister    Hypertension Sister    Vascular Disease Sister    Hyperlipidemia Sister    Hypertension Sister    Hyperlipidemia Mother    GER disease Mother    Irritable bowel syndrome Mother    Hypertension Brother    Hyperlipidemia Brother    Cancer Brother        lung   Hypertension Son    Hyperlipidemia Son    Stroke Maternal Grandmother    Diabetes Maternal Grandmother    Hypertension Maternal Grandmother    Hyperlipidemia Maternal Grandmother    Hypertension Maternal Grandfather    Heart attack Maternal Grandfather    Diabetes  Paternal Grandmother    Hyperlipidemia Paternal Grandmother    Hypertension Paternal Grandmother    Kidney disease Paternal Grandmother    Hypertension Paternal Grandfather    Hyperlipidemia Paternal Grandfather    Heart attack Paternal Grandfather    Diabetes Sister    Hypertension Son    Cancer Paternal Aunt        ovarian    Past Social History   Social History   Socioeconomic History   Marital status: Widowed    Spouse name: Jeannett Senior   Number of children: 3   Years of education: 9   Highest education level: Not on file  Occupational History   Not on file  Tobacco Use   Smoking status: Never   Smokeless tobacco: Never  Vaping Use   Vaping Use: Never used  Substance and Sexual Activity   Alcohol use: Yes    Comment: occ glass of red wine, 01-06-2017 RARELY   Drug use: Yes    Types: Marijuana    Comment: occ. 01-06-2017 PER PT [facility]   Sexual activity: Yes    Birth control/protection: Surgical    Comment: hyst  Other Topics Concern   Not on file  Social History Narrative   Lives independently   Daughter and granddaughter live with her      Disabled- for stomach problems and orthopedic problems, nerves   Social Determinants of Health   Financial Resource Strain: Not on file  Food Insecurity: Not on file  Transportation Needs: Not on file  Physical Activity: Not on file  Stress: Not on file  Social Connections: Not on file  Intimate Partner Violence: Not on file    Review of Systems   General: Negative for anorexia, weight loss, fever, chills, fatigue, weakness. ENT: Negative for hoarseness, difficulty swallowing , nasal congestion. CV: Negative for chest pain, angina, palpitations, dyspnea on exertion, peripheral edema.  Respiratory: Negative for dyspnea at rest, dyspnea on exertion, cough, sputum, wheezing.  GI: See history of present illness. GU:  Negative for dysuria, hematuria, urinary incontinence, urinary frequency, nocturnal  urination.  Endo: Negative for unusual weight change.     Physical Exam   There were no vitals taken for this visit.   General: Well-nourished, well-developed in no acute distress.  Eyes: No icterus. Mouth: Oropharyngeal mucosa moist and pink , no lesions erythema or exudate. Lungs: Clear to auscultation bilaterally.  Heart: Regular rate and rhythm, no murmurs rubs or gallops.  Abdomen: Bowel sounds are normal, nontender, nondistended, no hepatosplenomegaly or masses,  no abdominal bruits or hernia , no rebound or guarding.  Rectal: ***  Extremities: No lower extremity edema. No clubbing or deformities. Neuro: Alert and oriented x 4   Skin: Warm and dry, no jaundice.   Psych: Alert and cooperative, normal mood and affect.  Labs   *** Imaging Studies   No results found.  Assessment       PLAN   ***   Leanna Battles. Melvyn Neth, MHS, PA-C [facility]

## 2023-02-26 ENCOUNTER — Ambulatory Visit: Payer: 59 | Admitting: Gastroenterology

## 2023-02-28 NOTE — Progress Notes (Deleted)
GI Office Note    Referring Provider: Catalina Lunger, DO Primary Care Physician:  Catalina Lunger, DO  Primary Gastroenterologist:  Chief Complaint   No chief complaint on file.   History of Present Illness   Destiny Young is a 67 y.o. female presenting today      female presenting today for follow-up.  Last seen in September 2023.  She has a history of remote fundoplication in 2001,converted to Toupet fundoplication in 2018 at time of cholecystectomy, also with ventral hernia repair at that time.    EGD in June 2022 with possible stenotic fundoplication status post dilation of the esophagus.  Multiple large gastric ulcers likely NSAID related.  No H. pylori.  Advised to have 35-month surveillance EGD.  Completed EGD April 2023, gastric ulcers had healed.  Due to persistent dysphagia she completed barium pill esophagram May 2023 showing small epiphrenic distal esophageal diverticulum, intact Nissen fundoplication, mild age-related dysmotility.  Barium tablet passed without any issues.      EGD April 2023: -Esophageal diverticulum. Intact fundoplication; previously noted ulcers completely healed Normal duodenal bulb and second portion of the duodenum. - No specimens collected.   EGD June 2022: -Normal esophagus. Snug fundoplication?status post dilation. Multiple large gastric ulcer - status post biopsy -Normal duodenal bulb and s snugecond portion of the duodenum -Gastric biopsy showed mild chronic gastritis with ulceration.  No H. pylori.  No intestinal metaplasia. -Recommended 69-month surveillance EGD -It was felt this was likely due to NSAIDs use.   EGD 2016 with intact fundoplication and non H.pylori gastric erosions.  GES (2 hour) 2017 normal with 4.8% retention at 120 minutes.   MRCP 2016 with gallstones but no choledocholithiasis. Colonoscopy 2011 normal colon and TI. Cologuard December 2022: Negative    Medications   Current Outpatient Medications   Medication Sig Dispense Refill   acetaminophen (TYLENOL) 500 MG tablet Take 1,000 mg by mouth every 6 (six) hours as needed for pain.     ARTIFICIAL TEAR SOLUTION OP Place 1 drop into both eyes daily as needed (dry eyes).     aspirin EC 81 MG tablet Take 81 mg by mouth daily. Swallow whole.     atorvastatin (LIPITOR) 10 MG tablet Take 10 mg by mouth daily.     clonazePAM (KLONOPIN) 1 MG tablet Take 1 mg by mouth at bedtime.     diphenhydrAMINE (BENADRYL) 25 MG tablet Take 25 mg by mouth daily as needed for allergies.     DULoxetine (CYMBALTA) 20 MG capsule Take 20 mg by mouth daily.     hydrocortisone (ANUSOL-HC) 2.5 % rectal cream Place 1 Application rectally 2 (two) times daily. For 14 days. 30 g 1   lubiprostone (AMITIZA) 8 MCG capsule Take one capsule once to twice daily with food for constipation. (Patient taking differently: Take 8 mcg by mouth daily.) 60 capsule 5   Midazolam (NAYZILAM) 5 MG/0.1ML SOLN Place 1 each into the nose as needed (For seizure cluster).     Multiple Minerals-Vitamins (CALCIUM-MAGNESIUM-ZINC-D3) TABS Take 1 tablet by mouth daily.     Multiple Vitamins-Minerals (ADULT GUMMY PO) Take 2 capsules by mouth daily.     multivitamin-lutein (OCUVITE-LUTEIN) CAPS capsule Take 1 capsule by mouth daily.     OVER THE COUNTER MEDICATION Take 2 Units by mouth daily. Probiotic/prebiotic gummy     pantoprazole (PROTONIX) 40 MG tablet Take one tablet once to twice daily before a meal as needed. 180 tablet 3   PROVENTIL HFA 108 (90  Base) MCG/ACT inhaler INHALE TWO PUFFS BY MOUTH EVERY 6 HOURS AS NEEDED FOR SHORTNESS OF BREATH (Patient taking differently: Inhale 2 puffs into the lungs every 6 (six) hours as needed.) 18 g 1   No current facility-administered medications for this visit.    Allergies   Allergies as of 03/03/2023 - Review Complete 11/17/2022  Allergen Reaction Noted   Gluten meal Anaphylaxis and Shortness Of Breath 03/25/2021   Lactose  03/25/2021   Lactose  intolerance (gi)  03/25/2021   Ciprofloxacin Other (See Comments)    Codeine Nausea Only    Meperidine hcl Nausea And Vomiting    Metaxalone Nausea Only    Naproxen Nausea Only 07/29/2012   Neurontin [gabapentin] Other (See Comments) 12/05/2015   Simvastatin Other (See Comments) 08/21/2013   Sulfa antibiotics Itching    Tizanidine Other (See Comments) 07/05/2013   Trazodone and nefazodone Other (See Comments) 01/27/2017     Past Medical History   Past Medical History:  Diagnosis Date   Allergy    Anxiety    Aortic insufficiency    Arthritis    bulging discs   Asthma    Bradycardia    Burning chest pain    Cancer (HCC)    abnormal PAP   Depression    Ejection fraction    GERD (gastroesophageal reflux disease)    History of Nissen fundoplication   Hemorrhoids    High cholesterol    History of Nissen fundoplication    2000   IBS (irritable bowel syndrome)    Nodular thyroid disease 06/14/2020   Pelvic pain in female 12/05/2015   Pelvic relaxation 12/05/2015   Rectocele 12/05/2015   Seizures (HCC)    per Dr Gerilyn Pilgrim   Skin tag 12/05/2015   TMJ (dislocation of temporomandibular joint)    Ventricular tachycardia (HCC) 08/21/2013   7 beats ventricular tachycardia, rate 180, 8 day event recorder, July 13, 2013  //   nuclear stress study normal, November, 2014, normal EF by 2-D and nuclear     Past Surgical History   Past Surgical History:  Procedure Laterality Date   ABDOMINAL HYSTERECTOMY     ABDOMINAL SURGERY     hernia repair   BIOPSY  03/27/2021   Procedure: BIOPSY;  Surgeon: Corbin Ade, MD;  Location: [facility];  Service: Endoscopy;;   CERVICAL ABLATION     CHOLECYSTECTOMY     COLONOSCOPY  02/2010   Dr. Jena Gauss: Normal terminal ileum to 10 cm, normal colonoscopy.   ESOPHAGOGASTRODUODENOSCOPY  04/14/2006   RUE:[id number] esophagus with esophagogastric junction at 36 cm from the incisors.  Intact Nissen fundoplication, otherwise normal    ESOPHAGOGASTRODUODENOSCOPY  02/2010   Dr. Jena Gauss: normal, status post intact Nissen Fundoplication, antral erosions and erythema with chronic gastritis but no H. pylori on biopsies.   ESOPHAGOGASTRODUODENOSCOPY N/A 12/03/2014   Dr.Rourk- intact fundoplication, antral erosions s/p bx. Bx= focally eroded reactive gastopathy negative for hpylori   ESOPHAGOGASTRODUODENOSCOPY (EGD) WITH PROPOFOL N/A 03/27/2021   Procedure: ESOPHAGOGASTRODUODENOSCOPY (EGD) WITH PROPOFOL;  Surgeon: Corbin Ade, MD;  Location: [facility];  Service: Endoscopy;  Laterality: N/A;  Patient cannot come in any earlier.   ESOPHAGOGASTRODUODENOSCOPY (EGD) WITH PROPOFOL N/A 02/19/2022   Procedure: ESOPHAGOGASTRODUODENOSCOPY (EGD) WITH PROPOFOL;  Surgeon: Corbin Ade, MD;  Location: [facility];  Service: Endoscopy;  Laterality: N/A;  2:45pm   EXTERNAL EAR SURGERY     HERNIA REPAIR     MALONEY DILATION N/A 03/27/2021   Procedure: MALONEY DILATION;  Surgeon: Corbin Ade, MD;  Location: [facility];  Service: Endoscopy;  Laterality: N/A;   NISSEN FUNDOPLICATION  2000   NISSEN FUNDOPLICATION     NOSE SURGERY     SHOULDER SURGERY     TONSILLECTOMY      Past Family History   Family History  Problem Relation Age of Onset   Lung cancer Father    Cancer Father        lung cancer   Irritable bowel syndrome Sister    Irritable bowel syndrome Sister    Hyperlipidemia Sister    Hypertension Sister    Vascular Disease Sister    Hyperlipidemia Sister    Hypertension Sister    Hyperlipidemia Mother    GER disease Mother    Irritable bowel syndrome Mother    Hypertension Brother    Hyperlipidemia Brother    Cancer Brother        lung   Hypertension Son    Hyperlipidemia Son    Stroke Maternal Grandmother    Diabetes Maternal Grandmother    Hypertension Maternal Grandmother    Hyperlipidemia Maternal Grandmother    Hypertension Maternal Grandfather    Heart attack Maternal Grandfather    Diabetes  Paternal Grandmother    Hyperlipidemia Paternal Grandmother    Hypertension Paternal Grandmother    Kidney disease Paternal Grandmother    Hypertension Paternal Grandfather    Hyperlipidemia Paternal Grandfather    Heart attack Paternal Grandfather    Diabetes Sister    Hypertension Son    Cancer Paternal Aunt        ovarian    Past Social History   Social History   Socioeconomic History   Marital status: Widowed    Spouse name: Jeannett Senior   Number of children: 3   Years of education: 9   Highest education level: Not on file  Occupational History   Not on file  Tobacco Use   Smoking status: Never   Smokeless tobacco: Never  Vaping Use   Vaping Use: Never used  Substance and Sexual Activity   Alcohol use: Yes    Comment: occ glass of red wine, 01-06-2017 RARELY   Drug use: Yes    Types: Marijuana    Comment: occ. 01-06-2017 PER PT [facility]Y [facility]   Sexual activity: Yes    Birth control/protection: Surgical    Comment: hyst  Other Topics Concern   Not on file  Social History Narrative   Lives independently   Daughter and granddaughter live with her      Disabled- for stomach problems and orthopedic problems, nerves   Social Determinants of Health   Financial Resource Strain: Not on file  Food Insecurity: Not on file  Transportation Needs: Not on file  Physical Activity: Not on file  Stress: Not on file  Social Connections: Not on file  Intimate Partner Violence: Not on file    Review of Systems   General: Negative for anorexia, weight loss, fever, chills, fatigue, weakness. ENT: Negative for hoarseness, difficulty swallowing , nasal congestion. CV: Negative for chest pain, angina, palpitations, dyspnea on exertion, peripheral edema.  Respiratory: Negative for dyspnea at rest, dyspnea on exertion, cough, sputum, wheezing.  GI: See history of present illness. GU:  Negative for dysuria, hematuria, urinary incontinence, urinary frequency, nocturnal  urination.  Endo: Negative for unusual weight change.     Physical Exam   There were no vitals taken for this visit.   General: Well-nourished, well-developed in  no acute distress.  Eyes: No icterus. Mouth: Oropharyngeal mucosa moist and pink , no lesions erythema or exudate. Lungs: Clear to auscultation bilaterally.  Heart: Regular rate and rhythm, no murmurs rubs or gallops.  Abdomen: Bowel sounds are normal, nontender, nondistended, no hepatosplenomegaly or masses,  no abdominal bruits or hernia , no rebound or guarding.  Rectal: ***  Extremities: No lower extremity edema. No clubbing or deformities. Neuro: Alert and oriented x 4   Skin: Warm and dry, no jaundice.   Psych: Alert and cooperative, normal mood and affect.  Labs   *** Imaging Studies   No results found.  Assessment       PLAN   ***   Leanna Battles. Melvyn Neth, MHS, PA-C [facility]

## 2023-03-03 ENCOUNTER — Emergency Department (HOSPITAL_COMMUNITY)
Admission: EM | Admit: 2023-03-03 | Discharge: 2023-03-03 | Disposition: A | Discharge status: Home / Self Care | Payer: 59 | Attending: Emergency Medicine | Admitting: Emergency Medicine

## 2023-03-03 ENCOUNTER — Other Ambulatory Visit: Payer: Self-pay

## 2023-03-03 ENCOUNTER — Emergency Department (HOSPITAL_COMMUNITY): Payer: 59

## 2023-03-03 ENCOUNTER — Encounter (HOSPITAL_COMMUNITY): Payer: Self-pay | Admitting: *Deleted

## 2023-03-03 ENCOUNTER — Ambulatory Visit: Payer: 59 | Admitting: Gastroenterology

## 2023-03-03 DIAGNOSIS — K219 Gastro-esophageal reflux disease without esophagitis: Secondary | ICD-10-CM | POA: Insufficient documentation

## 2023-03-03 DIAGNOSIS — R109 Unspecified abdominal pain: Secondary | ICD-10-CM | POA: Diagnosis not present

## 2023-03-03 DIAGNOSIS — R059 Cough, unspecified: Secondary | ICD-10-CM | POA: Diagnosis not present

## 2023-03-03 DIAGNOSIS — J189 Pneumonia, unspecified organism: Secondary | ICD-10-CM

## 2023-03-03 DIAGNOSIS — J181 Lobar pneumonia, unspecified organism: Secondary | ICD-10-CM | POA: Diagnosis not present

## 2023-03-03 DIAGNOSIS — R1030 Lower abdominal pain, unspecified: Secondary | ICD-10-CM | POA: Diagnosis present

## 2023-03-03 DIAGNOSIS — Z7982 Long term (current) use of aspirin: Secondary | ICD-10-CM | POA: Insufficient documentation

## 2023-03-03 LAB — URINALYSIS, ROUTINE W REFLEX MICROSCOPIC
Bacteria, UA: NONE SEEN
Bilirubin Urine: NEGATIVE
Glucose, UA: NEGATIVE mg/dL
Ketones, ur: NEGATIVE mg/dL
Leukocytes,Ua: NEGATIVE
Nitrite: NEGATIVE
Protein, ur: NEGATIVE mg/dL
Specific Gravity, Urine: 1.002 — ABNORMAL LOW (ref 1.005–1.030)
pH: 7 (ref 5.0–8.0)

## 2023-03-03 LAB — COMPREHENSIVE METABOLIC PANEL
ALT: 22 U/L (ref 0–44)
AST: 23 U/L (ref 15–41)
Albumin: 3.8 g/dL (ref 3.5–5.0)
Alkaline Phosphatase: 77 U/L (ref 38–126)
Anion gap: 9 (ref 5–15)
BUN: 10 mg/dL (ref 8–23)
CO2: 27 mmol/L (ref 22–32)
Calcium: 9.7 mg/dL (ref 8.9–10.3)
Chloride: 100 mmol/L (ref 98–111)
Creatinine, Ser: 0.82 mg/dL (ref 0.44–1.00)
GFR, Estimated: >60 mL/min (ref >60)
Glucose, Bld: 100 mg/dL — ABNORMAL HIGH (ref 70–99)
Potassium: 3.4 mmol/L — ABNORMAL LOW (ref 3.5–5.1)
Sodium: 136 mmol/L (ref 135–145)
Total Bilirubin: 0.8 mg/dL (ref 0.3–1.2)
Total Protein: 7.2 g/dL (ref 6.5–8.1)

## 2023-03-03 LAB — CBC
HCT: 37.8 % (ref 36.0–46.0)
Hemoglobin: 12.3 g/dL (ref 12.0–15.0)
MCH: 31.5 pg (ref 26.0–34.0)
MCHC: 32.5 g/dL (ref 30.0–36.0)
MCV: 96.9 fL (ref 80.0–100.0)
Platelets: 374 10*3/uL (ref 150–400)
RBC: 3.9 MIL/uL (ref 3.87–5.11)
RDW: 12.4 % (ref 11.5–15.5)
WBC: 4.7 10*3/uL (ref 4.0–10.5)
nRBC: 0 % (ref 0.0–0.2)

## 2023-03-03 LAB — LIPASE, BLOOD: Lipase: 40 U/L (ref 11–51)

## 2023-03-03 MED ORDER — AMOXICILLIN-POT CLAVULANATE 875-125 MG PO TABS: 1.0000 | ORAL_TABLET | Freq: Two times a day (BID) | ORAL | 0 refills | Status: AC

## 2023-03-03 MED ORDER — PANTOPRAZOLE SODIUM 40 MG PO TBEC: 40.0000 mg | DELAYED_RELEASE_TABLET | Freq: Every day | ORAL | 0 refills | Status: DC

## 2023-03-03 MED ORDER — LACTATED RINGERS IV BOLUS
1000.0000 mL | Freq: Once | INTRAVENOUS | Status: AC
  Administered 2023-03-03: 1000 mL via INTRAVENOUS

## 2023-03-03 MED ORDER — IOHEXOL 300 MG/ML  SOLN
100.0000 mL | Freq: Once | INTRAMUSCULAR | Status: AC | PRN
  Administered 2023-03-03: 100 mL via INTRAVENOUS

## 2023-03-03 NOTE — ED Notes (Signed)
Patient transported to CT 

## 2023-03-03 NOTE — ED Provider Notes (Signed)
[facility] AT [facility] Provider Note   CSN: [id number] Arrival date & time: 03/03/23  4098     History  Chief Complaint  Patient presents with   Abdominal Pain    Destiny Young is a 67 y.o. female.  HPI 67 year old female presents with a chief complaint of abdominal pain.  She has been dealing with this for about 4 weeks.  The abdomen hurts all over but is primarily in her lower abdomen.  Started off with trouble having bowel movements she has been having constipation and pain for about 4 weeks. No blood in the stool but it has an ammonia smell to her. She goes every couple days. No fevers or vomiting, but she has a cough that's primarily at night and when she lays flat. She also has a bitter taste in her mouth. No pain with eating or heartburn/chest pain. Pain in her abdomen is progressively worsening. Had a GI appointment this AM but it was cancelled and so she came here for evaluation.  Home Medications Prior to Admission medications   Medication Sig Start Date End Date Taking? Authorizing Provider  amoxicillin-clavulanate (AUGMENTIN) 875-125 MG tablet Take 1 tablet by mouth every 12 (twelve) hours for 5 days. 03/03/23 03/08/23 Yes Pricilla Loveless, MD  pantoprazole (PROTONIX) 40 MG tablet Take 1 tablet (40 mg total) by mouth daily. 03/03/23  Yes Pricilla Loveless, MD  acetaminophen (TYLENOL) 500 MG tablet Take 1,000 mg by mouth every 6 (six) hours as needed for pain.    [provider]  ARTIFICIAL TEAR SOLUTION OP Place 1 drop into both eyes daily as needed (dry eyes).    [provider]  aspirin EC 81 MG tablet Take 81 mg by mouth daily. Swallow whole.    [provider]  atorvastatin (LIPITOR) 10 MG tablet Take 10 mg by mouth daily.    [provider]  clonazePAM (KLONOPIN) 1 MG tablet Take 1 mg by mouth at bedtime.    [provider]  diphenhydrAMINE (BENADRYL) 25 MG tablet Take 25 mg by mouth daily as  needed for allergies.    [provider]  DULoxetine (CYMBALTA) 20 MG capsule Take 20 mg by mouth daily. 06/11/20   [provider]  hydrocortisone (ANUSOL-HC) 2.5 % rectal cream Place 1 Application rectally 2 (two) times daily. For 14 days. 07/13/22   Tiffany Kocher, PA-C  lubiprostone (AMITIZA) 8 MCG capsule Take one capsule once to twice daily with food for constipation. Patient taking differently: Take 8 mcg by mouth daily. 01/12/22   Tiffany Kocher, PA-C  Midazolam (NAYZILAM) 5 MG/0.1ML SOLN Place 1 each into the nose as needed (For seizure cluster). 03/27/22   [provider]  Multiple Minerals-Vitamins (CALCIUM-MAGNESIUM-ZINC-D3) TABS Take 1 tablet by mouth daily.    [provider]  Multiple Vitamins-Minerals (ADULT GUMMY PO) Take 2 capsules by mouth daily.    [provider]  multivitamin-lutein (OCUVITE-LUTEIN) CAPS capsule Take 1 capsule by mouth daily.    [provider]  OVER THE COUNTER MEDICATION Take 2 Units by mouth daily. Probiotic/prebiotic gummy    [provider]  PROVENTIL HFA 108 (90 Base) MCG/ACT inhaler INHALE TWO PUFFS BY MOUTH EVERY 6 HOURS AS NEEDED FOR SHORTNESS OF BREATH Patient taking differently: Inhale 2 puffs into the lungs every 6 (six) hours as needed. 09/13/17   Eustace Moore, MD      Allergies    Gluten meal, Lactose, Lactose intolerance (gi), Ciprofloxacin, Codeine,  Meperidine hcl, Metaxalone, Naproxen, Neurontin [gabapentin], Simvastatin, Sulfa antibiotics, Tizanidine, and Trazodone and nefazodone    Review of Systems   Review of Systems  Constitutional:  Negative for fever.  Respiratory:  Positive for cough. Negative for shortness of breath.   Cardiovascular:  Negative for chest pain.  Gastrointestinal:  Positive for abdominal pain and constipation. Negative for blood in stool and vomiting.  Genitourinary:  Negative for dysuria.    Physical Exam Updated Vital Signs BP (!) 154/84    Pulse 73   Temp 98.4 F (36.9 C) (Oral)   Resp 16   Ht 5' 2.25" (1.581 m)   Wt 64.4 kg   SpO2 95%   BMI 25.76 kg/m  Physical Exam Vitals and nursing note reviewed.  Constitutional:      General: She is not in acute distress.    Appearance: She is well-developed. She is not ill-appearing.  HENT:     Head: Normocephalic and atraumatic.  Cardiovascular:     Rate and Rhythm: Normal rate and regular rhythm.     Heart sounds: Normal heart sounds.  Pulmonary:     Effort: Pulmonary effort is normal.     Breath sounds: Normal breath sounds.  Abdominal:     Palpations: Abdomen is soft.     Tenderness: There is generalized abdominal tenderness (worst in lower abdomen.).  Skin:    General: Skin is warm and dry.  Neurological:     Mental Status: She is alert.     ED Results / Procedures / Treatments   Labs (all labs ordered are listed, but only abnormal results are displayed) Labs Reviewed  COMPREHENSIVE METABOLIC PANEL - Abnormal; Notable for the following components:      Result Value   Potassium 3.4 (*)    Glucose, Bld 100 (*)    All other components within normal limits  URINALYSIS, ROUTINE W REFLEX MICROSCOPIC - Abnormal; Notable for the following components:   Color, Urine STRAW (*)    Specific Gravity, Urine 1.002 (*)    Hgb urine dipstick SMALL (*)    All other components within normal limits  LIPASE, BLOOD  CBC  CBG MONITORING, ED    EKG None  Radiology DG Chest 2 View  Result Date: 03/03/2023 CLINICAL DATA:  Cough EXAM: CHEST - 2 VIEW COMPARISON:  CXR 08/20/15 FINDINGS: No pleural effusion. No pneumothorax. No focal airspace opacity. Normal cardiac and mediastinal contours. No radiographically apparent displaced rib fractures. Visualized upper abdomen is unremarkable. Vertebral body heights are maintained. IMPRESSION: No focal airspace opacity. Electronically Signed   By: Lorenza Cambridge M.D.   On: 03/03/2023 12:32   CT ABDOMEN PELVIS W CONTRAST  Result Date:  03/03/2023 CLINICAL DATA:  Acute nonlocalized abdominal pain. Diffuse abdominal pain for 4 weeks. EXAM: CT ABDOMEN AND PELVIS WITH CONTRAST TECHNIQUE: Multidetector CT imaging of the abdomen and pelvis was performed using the standard protocol following bolus administration of intravenous contrast. RADIATION DOSE REDUCTION: This exam was performed according to the departmental dose-optimization program which includes automated exposure control, adjustment of the mA and/or kV according to patient size and/or use of iterative reconstruction technique. CONTRAST:  OMNIPAQUE IOHEXOL 300 MG/ML  SOLN COMPARISON:  01/13/2021 FINDINGS: Lower chest: Focal patchy infiltration in the left lung base. Bilateral lung nodules, right measuring 5 mm diameter and left 4 mm diameter. Nodules are unchanged since prior study. Small esophageal hiatal hernia with surgical clips at the EG junction. Hepatobiliary: No focal liver abnormality is seen. Status post cholecystectomy. No  biliary dilatation. Pancreas: Unremarkable. No pancreatic ductal dilatation or surrounding inflammatory changes. Spleen: Normal in size without focal abnormality. Adrenals/Urinary Tract: Adrenal glands are unremarkable. Kidneys are normal, without renal calculi, focal lesion, or hydronephrosis. Bladder is unremarkable. Stomach/Bowel: Stomach, small bowel, and colon are not abnormally distended. Scattered stool in the colon. No wall thickening or inflammatory changes. Appendix is not identified. Vascular/Lymphatic: Aortic atherosclerosis. No enlarged abdominal or pelvic lymph nodes. Reproductive: Status post hysterectomy. No adnexal masses. Other: No abdominal wall hernia or abnormality. No abdominopelvic ascites. Musculoskeletal: Degenerative changes in the spine. IMPRESSION: 1. Focal area of infiltration in the left lung base may represent early pneumonia. 2. Multiple pulmonary nodules. Most significant: Solid pulmonary nodule measuring 5 mm. No change since  previous study. Per Fleischner Society Guidelines, no routine follow-up imaging is recommended. These guidelines do not apply to immunocompromised patients and patients with cancer. Follow up in patients with significant comorbidities as clinically warranted. For lung cancer screening, adhere to Lung-RADS guidelines. Reference: Radiology. 2017; 284(1):228-43. 3. No evidence of bowel obstruction or inflammation. 4. Mild aortic atherosclerosis. Electronically Signed   By: Burman Nieves M.D.   On: 03/03/2023 12:27    Procedures Procedures    Medications Ordered in ED Medications  lactated ringers bolus 1,000 mL (0 mLs Intravenous Stopped 03/03/23 1353)  iohexol (OMNIPAQUE) 300 MG/ML solution 100 mL (100 mLs Intravenous Contrast Given 03/03/23 1210)    ED Course/ Medical Decision Making/ A&P                             Medical Decision Making Amount and/or Complexity of Data Reviewed Labs: ordered.    Details: UA unremarkable.  Normal lipase.  Normal WBC. Radiology: ordered and independent interpretation performed.    Details: No bowel obstruction.  Risk Prescription drug management.   CT is overall unremarkable.  She is overall well-appearing though has some diffuse tenderness but has declined pain meds.  CT shows a an occult pneumonia that is mild and so we will treat with antibiotics given the cough.  However overall I suspect the cough is coming from GERD.  She has had a previous Nissen fundoplication.  CT is otherwise unremarkable with no clear cause I think she should follow-up with GI.  Will treat with PPI (is no longer taking Protonix at this time) as well as the antibiotics.  Will give return precautions.        Final Clinical Impression(s) / ED Diagnoses Final diagnoses:  Pneumonia of left lower lobe due to infectious organism  Gastroesophageal reflux disease, unspecified whether esophagitis present    Rx / DC Orders ED Discharge Orders          Ordered     pantoprazole (PROTONIX) 40 MG tablet  Daily        03/03/23 1340    amoxicillin-clavulanate (AUGMENTIN) 875-125 MG tablet  Every 12 hours        03/03/23 1340              Pricilla Loveless, MD 03/03/23 1523

## 2023-03-03 NOTE — ED Triage Notes (Signed)
Pt has multiple complaints  Pt c/o abdominal pain and bloating and states when she lies down at night she coughs and has white "mucousy stuff that comes up in my mouth"  Pt states she stays bloated in her abdomen all the time and she states her eyes feel blurry and when she wakes up everyday they are "crusty"  Pt also states her bowel movements smell strongly of ammonia

## 2023-03-03 NOTE — Discharge Instructions (Signed)
Your x-ray shows some pneumonia.  I am also concerned he might be suffering from GERD causing some of your symptoms and cough.  You are being prescribed an antiacid called Protonix which limits acid production.  You are also being put on antibiotics for 5 days.  Follow-up with your primary care physician, especially for the pneumonia, as well as Dr. Jena Gauss for the abdominal symptoms.  If you develop worsening, continued, or recurrent abdominal pain, uncontrolled vomiting, fever, chest or back pain, or any other new/concerning symptoms then return to the ER for evaluation.

## 2023-03-10 ENCOUNTER — Telehealth: Payer: Self-pay

## 2023-03-10 NOTE — Telephone Encounter (Signed)
Transition Care Management Follow-up Telephone Call Date of discharge and from where: Destiny Young 5/8 How have you been since you were released from the hospital? Some better stomach still bothers her Any questions or concerns? No  Items Reviewed: Did the pt receive and understand the discharge instructions provided? Yes  Medications obtained and verified? Yes  Other? No  Any new allergies since your discharge? No  Dietary orders reviewed? No Do you have support at home? No     Follow up appointments reviewed:  PCP Hospital f/u appt confirmed? No  Scheduled to see  on  @ . Specialist Hospital f/u appt confirmed? No  Scheduled to see  on  @ . Are transportation arrangements needed? No  If their condition worsens, is the pt aware to call PCP or go to the Emergency Dept.? Yes Was the patient provided with contact information for the PCP's office or ED? Yes Was to pt encouraged to call back with questions or concerns? Yes

## 2023-04-27 DIAGNOSIS — H66012 Acute suppurative otitis media with spontaneous rupture of ear drum, left ear: Secondary | ICD-10-CM | POA: Diagnosis not present

## 2023-05-03 DIAGNOSIS — H04123 Dry eye syndrome of bilateral lacrimal glands: Secondary | ICD-10-CM | POA: Diagnosis not present

## 2023-05-14 DIAGNOSIS — H93A2 Pulsatile tinnitus, left ear: Secondary | ICD-10-CM | POA: Diagnosis not present

## 2023-05-14 DIAGNOSIS — H9202 Otalgia, left ear: Secondary | ICD-10-CM | POA: Diagnosis not present

## 2023-05-27 ENCOUNTER — Encounter (HOSPITAL_COMMUNITY): Payer: Self-pay | Admitting: Physical Medicine and Rehabilitation

## 2023-05-28 ENCOUNTER — Other Ambulatory Visit (HOSPITAL_COMMUNITY): Payer: Self-pay | Admitting: Physical Medicine and Rehabilitation

## 2023-05-28 DIAGNOSIS — H93A2 Pulsatile tinnitus, left ear: Secondary | ICD-10-CM

## 2023-06-18 ENCOUNTER — Encounter (HOSPITAL_COMMUNITY): Payer: Self-pay

## 2023-06-18 ENCOUNTER — Ambulatory Visit (HOSPITAL_COMMUNITY): Admission: RE | Admit: 2023-06-18 | Payer: 59 | Source: Ambulatory Visit

## 2023-06-18 DIAGNOSIS — H93A2 Pulsatile tinnitus, left ear: Secondary | ICD-10-CM | POA: Diagnosis not present

## 2023-07-20 DIAGNOSIS — R0600 Dyspnea, unspecified: Secondary | ICD-10-CM | POA: Diagnosis not present

## 2023-07-20 DIAGNOSIS — R059 Cough, unspecified: Secondary | ICD-10-CM | POA: Diagnosis not present

## 2023-07-20 DIAGNOSIS — R000 Tachycardia, unspecified: Secondary | ICD-10-CM | POA: Diagnosis not present

## 2023-07-20 DIAGNOSIS — R5383 Other fatigue: Secondary | ICD-10-CM | POA: Diagnosis not present

## 2023-07-20 DIAGNOSIS — E559 Vitamin D deficiency, unspecified: Secondary | ICD-10-CM | POA: Diagnosis not present

## 2023-07-20 DIAGNOSIS — R079 Chest pain, unspecified: Secondary | ICD-10-CM | POA: Diagnosis not present

## 2023-07-20 DIAGNOSIS — R002 Palpitations: Secondary | ICD-10-CM | POA: Diagnosis not present

## 2023-07-20 DIAGNOSIS — R5381 Other malaise: Secondary | ICD-10-CM | POA: Diagnosis not present

## 2023-08-06 DIAGNOSIS — I082 Rheumatic disorders of both aortic and tricuspid valves: Secondary | ICD-10-CM | POA: Diagnosis not present

## 2023-08-08 DIAGNOSIS — R002 Palpitations: Secondary | ICD-10-CM | POA: Diagnosis not present

## 2023-08-08 DIAGNOSIS — R0600 Dyspnea, unspecified: Secondary | ICD-10-CM | POA: Diagnosis not present

## 2023-08-13 DIAGNOSIS — H9313 Tinnitus, bilateral: Secondary | ICD-10-CM | POA: Diagnosis not present

## 2023-08-13 DIAGNOSIS — H93A2 Pulsatile tinnitus, left ear: Secondary | ICD-10-CM | POA: Diagnosis not present

## 2023-08-13 DIAGNOSIS — K219 Gastro-esophageal reflux disease without esophagitis: Secondary | ICD-10-CM | POA: Diagnosis not present

## 2023-08-13 DIAGNOSIS — H9193 Unspecified hearing loss, bilateral: Secondary | ICD-10-CM | POA: Diagnosis not present

## 2023-08-16 DIAGNOSIS — H01004 Unspecified blepharitis left upper eyelid: Secondary | ICD-10-CM | POA: Diagnosis not present

## 2023-08-16 DIAGNOSIS — H2511 Age-related nuclear cataract, right eye: Secondary | ICD-10-CM | POA: Diagnosis not present

## 2023-08-16 DIAGNOSIS — H25812 Combined forms of age-related cataract, left eye: Secondary | ICD-10-CM | POA: Diagnosis not present

## 2023-08-16 DIAGNOSIS — H01002 Unspecified blepharitis right lower eyelid: Secondary | ICD-10-CM | POA: Diagnosis not present

## 2023-08-16 DIAGNOSIS — H01001 Unspecified blepharitis right upper eyelid: Secondary | ICD-10-CM | POA: Diagnosis not present

## 2023-08-19 ENCOUNTER — Other Ambulatory Visit: Payer: Self-pay | Admitting: Physical Medicine and Rehabilitation

## 2023-08-19 ENCOUNTER — Other Ambulatory Visit (HOSPITAL_COMMUNITY): Payer: Self-pay | Admitting: Physical Medicine and Rehabilitation

## 2023-08-19 DIAGNOSIS — H93A2 Pulsatile tinnitus, left ear: Secondary | ICD-10-CM

## 2023-08-23 DIAGNOSIS — R002 Palpitations: Secondary | ICD-10-CM | POA: Diagnosis not present

## 2023-08-26 ENCOUNTER — Encounter (HOSPITAL_COMMUNITY): Payer: Self-pay | Admitting: Physical Medicine and Rehabilitation

## 2023-08-30 DIAGNOSIS — H25812 Combined forms of age-related cataract, left eye: Secondary | ICD-10-CM | POA: Diagnosis not present

## 2023-09-06 ENCOUNTER — Ambulatory Visit (HOSPITAL_COMMUNITY)
Admission: RE | Admit: 2023-09-06 | Discharge: 2023-09-06 | Disposition: A | Discharge status: Home / Self Care | Payer: 59 | Source: Ambulatory Visit | Attending: Physical Medicine and Rehabilitation | Admitting: Physical Medicine and Rehabilitation

## 2023-09-06 DIAGNOSIS — H93A2 Pulsatile tinnitus, left ear: Secondary | ICD-10-CM | POA: Diagnosis not present

## 2023-09-06 DIAGNOSIS — H9312 Tinnitus, left ear: Secondary | ICD-10-CM | POA: Diagnosis not present

## 2023-09-06 NOTE — H&P (Signed)
Surgical History & Physical  Patient Name: Destiny Young  DOB: 01-03-1956  Surgery: Cataract extraction with intraocular lens implant phacoemulsification; Left Eye Surgeon: Fabio Pierce MD Surgery Date: 09/10/2023 Pre-Op Date: 08/16/2023  HPI: A 23 Yr. old female patient 1. The patient is here for cataract surgery- OU. Pt. was referred by Dr. Earlene Plater Pt. complains of difficulty when reading. Both eyes are affected. Symptoms occur when the patient is reading. Near vision is worse than distance. The left eye is worse than the right eye This is negatively affecting the patient's quality of life and the patient is unable to function adequately in life with the current level of vision. Pt. has dry eyes which is worse in OD since 2022, after having Covid. Pt. also had trauma to head in 1994, since pt. 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 HPI Completed by Dr. Fabio Pierce  Medical History: Dry Eyes Cataracts BI: Vitreous degeneration Cholesterol, nerve damage, pseudo seizures  Review of Systems Negative Allergic/Immunologic Negative Cardiovascular Negative Constitutional Negative Ear, Nose, Mouth & Throat Negative Endocrine Cataracts Eyes Negative Gastrointestinal Negative Genitourinary Negative Hemotologic/Lymphatic Negative Integumentary Negative Musculoskeletal Pseudo seizures Neurological Negative Psychiatry Negative Respiratory  Social Never smoked   Medication OTC Dry eye drops,  pantoprazole ,  albuterol sulfate ,  diclofenac sodium ,  prednisolone acetate ,  Ilevro ,  ofloxacin   Sx/Procedures Ear and nose surgery, Tumor removed out of back, Hernia repair, Tonsilectomy, Partial Hysterecomy  Drug Allergies  tramadol ,  Topamax ,  Sulfa (Sulfonamide Antibiotics) ,  skelaxin ,  Demerol   History & Physical: Heent: cataract NECK: supple without bruits LUNGS: lungs clear to auscultation CV: regular rate and rhythm Abdomen: soft and  non-tender  Impression & Plan: Assessment: 1.  COMBINED FORMS AGE RELATED CATARACT; Left Eye (H25.812) 2.  BLEPHARITIS; Right Upper Lid, Right Lower Lid, Left Upper Lid, Left Lower Lid (H01.001, H01.002,H01.004,H01.005) 3.  NUCLEAR SCLEROSIS AGE RELATED; Right Eye (H25.11)  Plan: 1.  Cataract accounts for the patient's decreased vision. This visual impairment is not correctable with a tolerable change in glasses or contact lenses. Cataract surgery with an implantation of a new lens should significantly improve the visual and functional status of the patient. Discussed all risks, benefits, alternatives, and potential complications. Discussed the procedures and recovery. Patient desires to have surgery. A-scan ordered and performed today for intra-ocular lens calculations. The surgery will be performed in order to improve vision for driving, reading, and for eye examinations. Recommend phacoemulsification with intra-ocular lens. Recommend Dextenza for post-operative pain and inflammation. Left Eye. Dilates well - shugarcaine by protocol.  2.  Blepharitis is present - recommend regular lid cleaning.  3.  will address after left eye.

## 2023-09-08 ENCOUNTER — Encounter (HOSPITAL_COMMUNITY)
Admission: RE | Admit: 2023-09-08 | Discharge: 2023-09-08 | Disposition: A | Discharge status: Home / Self Care | Payer: 59 | Source: Ambulatory Visit | Attending: Ophthalmology | Admitting: Ophthalmology

## 2023-09-09 ENCOUNTER — Other Ambulatory Visit: Payer: Self-pay

## 2023-09-09 ENCOUNTER — Encounter (HOSPITAL_COMMUNITY): Payer: Self-pay

## 2023-09-10 ENCOUNTER — Ambulatory Visit (HOSPITAL_COMMUNITY): Payer: 59 | Admitting: Anesthesiology

## 2023-09-10 ENCOUNTER — Encounter (HOSPITAL_COMMUNITY): Payer: Self-pay | Admitting: Ophthalmology

## 2023-09-10 ENCOUNTER — Ambulatory Visit (HOSPITAL_COMMUNITY)
Admission: RE | Admit: 2023-09-10 | Discharge: 2023-09-10 | Disposition: A | Discharge status: Home / Self Care | Payer: 59 | Attending: Ophthalmology | Admitting: Ophthalmology

## 2023-09-10 ENCOUNTER — Encounter (HOSPITAL_COMMUNITY)
Admission: RE | Disposition: A | Discharge status: Home / Self Care | Payer: Self-pay | Source: Home / Self Care | Attending: Ophthalmology

## 2023-09-10 DIAGNOSIS — H25812 Combined forms of age-related cataract, left eye: Secondary | ICD-10-CM | POA: Insufficient documentation

## 2023-09-10 DIAGNOSIS — F413 Other mixed anxiety disorders: Secondary | ICD-10-CM

## 2023-09-10 DIAGNOSIS — I351 Nonrheumatic aortic (valve) insufficiency: Secondary | ICD-10-CM | POA: Diagnosis not present

## 2023-09-10 DIAGNOSIS — F1290 Cannabis use, unspecified, uncomplicated: Secondary | ICD-10-CM | POA: Diagnosis not present

## 2023-09-10 DIAGNOSIS — J45909 Unspecified asthma, uncomplicated: Secondary | ICD-10-CM | POA: Insufficient documentation

## 2023-09-10 DIAGNOSIS — K219 Gastro-esophageal reflux disease without esophagitis: Secondary | ICD-10-CM | POA: Insufficient documentation

## 2023-09-10 HISTORY — PX: CATARACT EXTRACTION W/PHACO: SHX586

## 2023-09-10 SURGERY — PHACOEMULSIFICATION, CATARACT, WITH IOL INSERTION
Anesthesia: Monitor Anesthesia Care | Site: Eye | Laterality: Left

## 2023-09-10 MED ORDER — BSS IO SOLN
INTRAOCULAR | Status: DC | PRN
  Administered 2023-09-10: 15 mL via INTRAOCULAR

## 2023-09-10 MED ORDER — TROPICAMIDE 1 % OP SOLN
1.0000 [drp] | OPHTHALMIC | Status: AC | PRN
  Administered 2023-09-10 (×3): 1 [drp] via OPHTHALMIC

## 2023-09-10 MED ORDER — STERILE WATER FOR IRRIGATION IR SOLN
Status: DC | PRN
  Administered 2023-09-10: 250 mL

## 2023-09-10 MED ORDER — SODIUM CHLORIDE 0.9% FLUSH
INTRAVENOUS | Status: DC | PRN
  Administered 2023-09-10: 5 mL via INTRAVENOUS

## 2023-09-10 MED ORDER — SODIUM HYALURONATE 10 MG/ML IO SOLUTION
PREFILLED_SYRINGE | INTRAOCULAR | Status: DC | PRN
  Administered 2023-09-10: .85 mL via INTRAOCULAR

## 2023-09-10 MED ORDER — LIDOCAINE HCL (PF) 1 % IJ SOLN
INTRAOCULAR | Status: DC | PRN
  Administered 2023-09-10: 1 mL via OPHTHALMIC

## 2023-09-10 MED ORDER — PHENYLEPHRINE HCL 2.5 % OP SOLN
1.0000 [drp] | OPHTHALMIC | Status: AC | PRN
  Administered 2023-09-10 (×3): 1 [drp] via OPHTHALMIC

## 2023-09-10 MED ORDER — TETRACAINE HCL 0.5 % OP SOLN
1.0000 [drp] | OPHTHALMIC | Status: AC | PRN
  Administered 2023-09-10 (×3): 1 [drp] via OPHTHALMIC

## 2023-09-10 MED ORDER — EPINEPHRINE PF 1 MG/ML IJ SOLN
INTRAOCULAR | Status: DC | PRN
  Administered 2023-09-10: 500 mL

## 2023-09-10 MED ORDER — MIDAZOLAM HCL 5 MG/5ML IJ SOLN
INTRAMUSCULAR | Status: DC | PRN
  Administered 2023-09-10: 1 mg via INTRAVENOUS

## 2023-09-10 MED ORDER — SODIUM HYALURONATE 23MG/ML IO SOSY
PREFILLED_SYRINGE | INTRAOCULAR | Status: DC | PRN
  Administered 2023-09-10: .6 mL via INTRAOCULAR

## 2023-09-10 MED ORDER — POVIDONE-IODINE 5 % OP SOLN
OPHTHALMIC | Status: DC | PRN
  Administered 2023-09-10: 1 via OPHTHALMIC

## 2023-09-10 MED ORDER — LIDOCAINE HCL 3.5 % OP GEL
1.0000 | Freq: Once | OPHTHALMIC | Status: AC
  Administered 2023-09-10: 1 via OPHTHALMIC

## 2023-09-10 MED ORDER — MOXIFLOXACIN HCL 5 MG/ML IO SOLN
INTRAOCULAR | Status: DC | PRN
  Administered 2023-09-10: .2 mL via INTRACAMERAL

## 2023-09-10 SURGICAL SUPPLY — 14 items
CATARACT SUITE SIGHTPATH (MISCELLANEOUS) ×1
CLOTH BEACON ORANGE TIMEOUT ST (SAFETY) ×1 IMPLANT
EYE SHIELD UNIVERSAL CLEAR (GAUZE/BANDAGES/DRESSINGS) IMPLANT
FEE CATARACT SUITE SIGHTPATH (MISCELLANEOUS) ×1 IMPLANT
GLOVE BIOGEL PI IND STRL 7.0 (GLOVE) ×2 IMPLANT
LENS IOL TECNIS EYHANCE 20.0 (Intraocular Lens) IMPLANT
NDL HYPO 18GX1.5 BLUNT FILL (NEEDLE) ×1 IMPLANT
NEEDLE HYPO 18GX1.5 BLUNT FILL (NEEDLE) ×1
PAD ARMBOARD 7.5X6 YLW CONV (MISCELLANEOUS) ×1 IMPLANT
POSITIONER HEAD 8X9X4 ADT (SOFTGOODS) ×1 IMPLANT
RING MALYGIN 7.0 (MISCELLANEOUS) IMPLANT
SYR TB 1ML LL NO SAFETY (SYRINGE) ×1 IMPLANT
TAPE SURG TRANSPORE 1 IN (GAUZE/BANDAGES/DRESSINGS) IMPLANT
WATER STERILE IRR 250ML POUR (IV SOLUTION) ×1 IMPLANT

## 2023-09-10 NOTE — Progress Notes (Signed)
September 10, 2023  Patient: Destiny Young  Date of Birth: 1956/05/14  Date of Visit: 08/30/2023    To Whom It May Concern:  Vestal Delval was seen and treated in our [facility] center urgent care center on 09/10/2023.     Sincerely,   Zane Herald, RN

## 2023-09-10 NOTE — Transfer of Care (Signed)
Immediate Anesthesia Transfer of Care Note  Patient: DONYEA WILENSKY  Procedure(s) Performed: CATARACT EXTRACTION PHACO AND INTRAOCULAR LENS PLACEMENT (IOC) (Left: Eye)  Patient Location: Short Stay  Anesthesia Type:MAC  Level of Consciousness: awake  Airway & Oxygen Therapy: Patient Spontanous Breathing  Post-op Assessment: Report given to RN  Post vital signs: Reviewed and stable  Last Vitals:  Vitals Value Taken Time  BP 140/68 09/10/23 1149  Temp 36.8 C 09/10/23 1149  Pulse 66 09/10/23 1149  Resp 16 09/10/23 1149  SpO2 100 % 09/10/23 1149    Last Pain:  Vitals:   09/10/23 1149  TempSrc: Oral  PainSc: 0-No pain         Complications: No notable events documented.

## 2023-09-10 NOTE — Anesthesia Preprocedure Evaluation (Signed)
Anesthesia Evaluation  Patient identified by MRN, date of birth, ID band Patient awake    Reviewed: Allergy & Precautions, NPO status , Patient's Chart, lab work & pertinent test results  Airway Mallampati: II  TM Distance: >3 FB Neck ROM: Full    Dental no notable dental hx. 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),  PSYCHIATRIC DISORDERS Anxiety Depression     Neuromuscular disease    GI/Hepatic PUD,GERD  Medicated and Poorly Controlled,,(+)     substance abuse  marijuana use  Endo/Other  negative endocrine ROS    Renal/GU negative Renal ROS  negative genitourinary   Musculoskeletal  (+) Arthritis ,    Abdominal Normal abdominal exam  (+)   Peds negative pediatric ROS (+)  Hematology negative hematology ROS (+)   Anesthesia Other Findings Cancer  Reproductive/Obstetrics negative OB ROS                             Anesthesia Physical Anesthesia Plan  ASA: 3  Anesthesia Plan: MAC   Post-op Pain Management: Minimal or no pain anticipated   Induction: Intravenous  PONV Risk Score and Plan:   Airway Management Planned: Nasal Cannula and Natural Airway  Additional Equipment: None  Intra-op Plan:   Post-operative Plan:   Informed Consent: I have reviewed the patients History and Physical, chart, labs and discussed the procedure including the risks, benefits and alternatives for the proposed anesthesia with the patient or authorized representative who has indicated his/her understanding and acceptance.     Dental advisory given  Plan  Discussed with: CRNA  Anesthesia Plan Comments:         Anesthesia Quick Evaluation

## 2023-09-10 NOTE — Op Note (Signed)
Date of procedure: 09/10/23  Pre-operative diagnosis: Visually significant age-related combined cataract, Left Eye (H25.812)  Post-operative diagnosis: Visually significant age-related combined cataract, Left Eye (H25.812)  Procedure: Removal of cataract via phacoemulsification and insertion of intra-ocular lens [employer] DIB00 +20.0D into the capsular bag of the Left Eye  Attending surgeon: Rudy Jew. Eugenia Eldredge, MD, MA  Anesthesia: MAC, Topical Akten  Complications: None  Estimated Blood Loss: <96mL (minimal)  Specimens: None  Implants: As above  Indications:  Visually significant age-related cataract, Left Eye  Procedure:  The patient was seen and identified in the pre-operative area. The operative eye was identified and dilated.  The operative eye was marked.  Topical anesthesia was administered to the operative eye.     The patient was then to the operative suite and placed in the supine position.  A timeout was performed confirming the patient, procedure to be performed, and all other relevant information.   The patient's face was prepped and draped in the usual fashion for intra-ocular surgery.  A lid speculum was placed into the operative eye and the surgical microscope moved into place and focused.  An inferotemporal paracentesis was created using a 20 gauge paracentesis blade.  Shugarcaine was injected into the anterior chamber.  Viscoelastic was injected into the anterior chamber.  A temporal clear-corneal main wound incision was created using a 2.72mm microkeratome.  A continuous curvilinear capsulorrhexis was initiated using an irrigating cystitome and completed using capsulorrhexis forceps.  Hydrodissection and hydrodeliniation were performed.  Viscoelastic was injected into the anterior chamber.  A phacoemulsification handpiece and a chopper as a second instrument were used to remove the nucleus and epinucleus. The irrigation/aspiration handpiece was used to remove any  remaining cortical material.   The capsular bag was reinflated with viscoelastic, checked, and found to be intact.  The intraocular lens was inserted into the capsular bag.  The irrigation/aspiration handpiece was used to remove any remaining viscoelastic.  The clear corneal wound and paracentesis wounds were then hydrated and checked with Weck-Cels to be watertight. 0.56mL of Moxfloxacin was injected into the anterior chamber. The lid-speculum was removed.  The drape was removed.  The patient's face was cleaned with a wet and dry 4x4.    A clear shield was taped over the eye. The patient was taken to the post-operative care unit in good condition, having tolerated the procedure well.  Post-Op Instructions: The patient will follow up at [facility] for a same day post-operative evaluation and will receive all other orders and instructions.

## 2023-09-10 NOTE — Discharge Instructions (Signed)
Please discharge patient when stable, will follow up today with Dr. Carnella Fryman at the [facility] office immediately following discharge.  Leave shield in place until visit.  All paperwork with discharge instructions will be given at the office.  [facility] Address:  [address]  [place], [place] [address]  

## 2023-09-10 NOTE — Anesthesia Postprocedure Evaluation (Signed)
Anesthesia Post Note  Patient: Destiny Young  Procedure(s) Performed: CATARACT EXTRACTION PHACO AND INTRAOCULAR LENS PLACEMENT (IOC) (Left: Eye)  Patient location during evaluation: Short Stay Anesthesia Type: MAC Level of consciousness: awake and alert Pain management: pain level controlled Vital Signs Assessment: post-procedure vital signs reviewed and stable Respiratory status: spontaneous breathing Cardiovascular status: blood pressure returned to baseline and stable Postop Assessment: no apparent nausea or vomiting Anesthetic complications: no   No notable events documented.   Last Vitals:  Vitals:   09/10/23 1100 09/10/23 1149  BP:  (!) 140/68  Pulse: (!) 57 66  Resp: (!) 25 16  Temp:  36.8 C  SpO2: 99% 100%    Last Pain:  Vitals:   09/10/23 1149  TempSrc: Oral  PainSc: 0-No pain                 Zanobia Griebel

## 2023-09-10 NOTE — Interval H&P Note (Signed)
History and Physical Interval Note:  09/10/2023 11:22 AM  [name]  has presented today for surgery, with the diagnosis of combined forms age related cataract, left eye.  The various methods of treatment have been discussed with the patient and family. After consideration of risks, benefits and other options for treatment, the patient has consented to  Procedure(s) with comments: CATARACT EXTRACTION PHACO AND INTRAOCULAR LENS PLACEMENT (IOC) (Left) - CDE: as a surgical intervention.  The patient's history has been reviewed, patient examined, no change in status, stable for surgery.  I have reviewed the patient's chart and labs.  Questions were answered to the patient's satisfaction.     Destiny Young

## 2023-09-14 ENCOUNTER — Encounter (HOSPITAL_COMMUNITY): Payer: Self-pay | Admitting: Ophthalmology

## 2023-09-16 DIAGNOSIS — R002 Palpitations: Secondary | ICD-10-CM | POA: Diagnosis not present

## 2023-09-17 DIAGNOSIS — I471 Supraventricular tachycardia, unspecified: Secondary | ICD-10-CM | POA: Diagnosis not present

## 2023-10-08 ENCOUNTER — Other Ambulatory Visit (HOSPITAL_COMMUNITY): Payer: 59

## 2023-11-08 ENCOUNTER — Encounter (HOSPITAL_COMMUNITY)
Admission: RE | Admit: 2023-11-08 | Discharge: 2023-11-08 | Disposition: A | Discharge status: Home / Self Care | Payer: 59 | Source: Ambulatory Visit | Attending: Ophthalmology | Admitting: Ophthalmology

## 2023-11-08 NOTE — Pre-Procedure Instructions (Signed)
 Patient calls back and wants to postpone her surgery. Zella Ball at [facility] notified.

## 2023-11-08 NOTE — Pre-Procedure Instructions (Signed)
 Attempted pre-op phonecall. Left VM for her to call us back.

## 2023-11-12 ENCOUNTER — Ambulatory Visit (HOSPITAL_COMMUNITY): Admit: 2023-11-12 | Payer: 59 | Admitting: Ophthalmology

## 2023-11-12 SURGERY — CATARACT EXTRACTION PHACO AND INTRAOCULAR LENS PLACEMENT (IOC)
Anesthesia: Monitor Anesthesia Care | Laterality: Right

## 2024-01-21 ENCOUNTER — Ambulatory Visit: Admitting: Internal Medicine

## 2024-01-30 NOTE — Progress Notes (Unsigned)
 GI Office Note    Referring Provider: Catalina Lunger, DO Primary Care Physician:  Catalina Lunger, DO  Primary Gastroenterologist:  Chief Complaint   No chief complaint on file.   History of Present Illness   Destiny Young is a 68 y.o. female presenting today          Medications   Current Outpatient Medications  Medication Sig Dispense Refill   acetaminophen (TYLENOL) 500 MG tablet Take 1,000 mg by mouth every 6 (six) hours as needed for pain.     ARTIFICIAL TEAR SOLUTION OP Place 1 drop into both eyes daily as needed (dry eyes).     aspirin EC 81 MG tablet Take 81 mg by mouth daily. Swallow whole.     atorvastatin (LIPITOR) 10 MG tablet Take 10 mg by mouth daily.     clonazePAM (KLONOPIN) 1 MG tablet Take 1 mg by mouth at bedtime.     diphenhydrAMINE (BENADRYL) 25 MG tablet Take 25 mg by mouth daily as needed for allergies.     DULoxetine (CYMBALTA) 20 MG capsule Take 20 mg by mouth daily.     hydrocortisone (ANUSOL-HC) 2.5 % rectal cream Place 1 Application rectally 2 (two) times daily. For 14 days. 30 g 1   lubiprostone (AMITIZA) 8 MCG capsule Take one capsule once to twice daily with food for constipation. (Patient taking differently: Take 8 mcg by mouth daily.) 60 capsule 5   metoprolol tartrate (LOPRESSOR) 25 MG tablet Take 25 mg by mouth 2 (two) times daily.     Midazolam (NAYZILAM) 5 MG/0.1ML SOLN Place 1 each into the nose as needed (For seizure cluster).     Multiple Minerals-Vitamins (CALCIUM-MAGNESIUM-ZINC-D3) TABS Take 1 tablet by mouth daily.     Multiple Vitamins-Minerals (ADULT GUMMY PO) Take 2 capsules by mouth daily.     multivitamin-lutein (OCUVITE-LUTEIN) CAPS capsule Take 1 capsule by mouth daily.     OVER THE COUNTER MEDICATION Take 2 Units by mouth daily. Probiotic/prebiotic gummy     pantoprazole (PROTONIX) 40 MG tablet Take 1 tablet (40 mg total) by mouth daily. 30 tablet 0   PROVENTIL HFA 108 (90 Base) MCG/ACT inhaler INHALE TWO PUFFS BY  MOUTH EVERY 6 HOURS AS NEEDED FOR SHORTNESS OF BREATH (Patient taking differently: Inhale 2 puffs into the lungs every 6 (six) hours as needed.) 18 g 1   No current facility-administered medications for this visit.    Allergies   Allergies as of 01/31/2024 - Review Complete 09/10/2023  Allergen Reaction Noted   Gluten meal Anaphylaxis and Shortness Of Breath 03/25/2021   Lactose  03/25/2021   Lactose intolerance (gi)  03/25/2021   Ciprofloxacin Other (See Comments)    Codeine Nausea Only    Meperidine hcl Nausea And Vomiting    Metaxalone Nausea Only    Naproxen Nausea Only 07/29/2012   Neurontin [gabapentin] Other (See Comments) 12/05/2015   Simvastatin Other (See Comments) 08/21/2013   Sulfa antibiotics Itching    Tizanidine Other (See Comments) 07/05/2013   Trazodone and nefazodone Other (See Comments) 01/27/2017     Past Medical History   Past Medical History:  Diagnosis Date   Allergy    Anxiety    Aortic insufficiency    Arthritis    bulging discs   Asthma    Bradycardia    Burning chest pain    Cancer (HCC)    abnormal PAP   Depression    Ejection fraction    GERD (gastroesophageal reflux disease)  History of Nissen fundoplication   Hemorrhoids    High cholesterol    History of Nissen fundoplication    2000   IBS (irritable bowel syndrome)    Nodular thyroid disease 06/14/2020   Pelvic pain in female 12/05/2015   Pelvic relaxation 12/05/2015   Rectocele 12/05/2015   Seizures (HCC)    per Dr Gerilyn Pilgrim   Skin tag 12/05/2015   TMJ (dislocation of temporomandibular joint)    Ventricular tachycardia (HCC) 08/21/2013   7 beats ventricular tachycardia, rate 180, 8 day event recorder, July 13, 2013  //   nuclear stress study normal, November, 2014, normal EF by 2-D and nuclear     Past Surgical History   Past Surgical History:  Procedure Laterality Date   ABDOMINAL HYSTERECTOMY     ABDOMINAL SURGERY     hernia repair   BIOPSY  03/27/2021    Procedure: BIOPSY;  Surgeon: Corbin Ade, MD;  Location: [facility];  Service: Endoscopy;;   CATARACT EXTRACTION W/PHACO Left 09/10/2023   Procedure: CATARACT EXTRACTION PHACO AND INTRAOCULAR LENS PLACEMENT (IOC);  Surgeon: Fabio Pierce, MD;  Location: [facility] OR[facility];  Service: Ophthalmology;  Laterality: Left;  CDE: 10.40   CERVICAL ABLATION     CHOLECYSTECTOMY     COLONOSCOPY  02/2010   Dr. Jena Gauss: Normal terminal ileum to 10 cm, normal colonoscopy.   ESOPHAGOGASTRODUODENOSCOPY  04/14/2006   NUU:[id number] esophagus with esophagogastric junction at 36 cm from the incisors.  Intact Nissen fundoplication, otherwise normal   ESOPHAGOGASTRODUODENOSCOPY  02/2010   Dr. Jena Gauss: normal, status post intact Nissen Fundoplication, antral erosions and erythema with chronic gastritis but no H. pylori on biopsies.   ESOPHAGOGASTRODUODENOSCOPY N/A 12/03/2014   Dr.Rourk- intact fundoplication, antral erosions s/p bx. Bx= focally eroded reactive gastopathy negative for hpylori   ESOPHAGOGASTRODUODENOSCOPY (EGD) WITH PROPOFOL N/A 03/27/2021   Procedure: ESOPHAGOGASTRODUODENOSCOPY (EGD) WITH PROPOFOL;  Surgeon: Corbin Ade, MD;  [facility];  Service: Endoscopy;  Laterality: N/A;  Patient cannot come in any earlier.   ESOPHAGOGASTRODUODENOSCOPY (EGD) WITH PROPOFOL N/A 02/19/2022   Procedure: ESOPHAGOGASTRODUODENOSCOPY (EGD) WITH PROPOFOL;  Surgeon: Corbin Ade, MD;  [facility];  Service: Endoscopy;  Laterality: N/A;  2:45pm   EXTERNAL EAR SURGERY     HERNIA REPAIR     MALONEY DILATION N/A 03/27/2021   Procedure: MALONEY DILATION;  Surgeon: Corbin Ade, MD;  Location: [facility];  Service: Endoscopy;  Laterality: N/A;   NISSEN FUNDOPLICATION  2000   NISSEN FUNDOPLICATION     NOSE SURGERY     SHOULDER SURGERY     TONSILLECTOMY      Past Family History   Family History  Problem Relation Age of Onset   Lung cancer Father    Cancer Father        lung cancer    Irritable bowel syndrome Sister    Irritable bowel syndrome Sister    Hyperlipidemia Sister    Hypertension Sister    Vascular Disease Sister    Hyperlipidemia Sister    Hypertension Sister    Hyperlipidemia Mother    GER disease Mother    Irritable bowel syndrome Mother    Hypertension Brother    Hyperlipidemia Brother    Cancer Brother        lung   Hypertension Son    Hyperlipidemia Son    Stroke Maternal Grandmother    Diabetes Maternal Grandmother    Hypertension Maternal Grandmother    Hyperlipidemia Maternal Grandmother  Hypertension Maternal Grandfather    Heart attack Maternal Grandfather    Diabetes Paternal Grandmother    Hyperlipidemia Paternal Grandmother    Hypertension Paternal Grandmother    Kidney disease Paternal Grandmother    Hypertension Paternal Grandfather    Hyperlipidemia Paternal Grandfather    Heart attack Paternal Grandfather    Diabetes Sister    Hypertension Son    Cancer Paternal Aunt        ovarian    Past Social History   Social History   Socioeconomic History   Marital status: Widowed    Spouse name: Jeannett Senior   Number of children: 3   Years of education: 9   Highest education level: Not on file  Occupational History   Not on file  Tobacco Use   Smoking status: Never   Smokeless tobacco: Never  Vaping Use   Vaping status: Never Used  Substance and Sexual Activity   Alcohol use: Yes    Comment: occ glass of red wine, 01-06-2017 RARELY   Drug use: Yes    Types: Marijuana    Comment: occ. 01-06-2017 PER PT TRY [facility]IN [facility]   Sexual activity: Yes    Birth control/protection: Surgical    Comment: hyst  Other Topics Concern   Not on file  Social History Narrative   Lives independently   Daughter and granddaughter live with her      Disabled- for stomach problems and orthopedic problems, nerves   Social Drivers of Health   Financial Resource Strain: Low Risk  (03/15/2023)   Received from [facility]    Overall Financial Resource Strain (CARDIA)    Difficulty of Paying Living Expenses: Not hard at all  Food Insecurity: No Food Insecurity (09/20/2023)   Received from [facility]   Hunger Vital Sign    Worried About Running Out of Food in the Last Year: Never true    Ran Out of Food in the Last Year: Never true  Transportation Needs: No Transportation Needs (09/20/2023)   Received from [facility]   PRAPARE - Transportation    Lack of Transportation (Medical): No    Lack of Transportation (Non-Medical): No  Physical Activity: Inactive (03/15/2023)   Received from [facility]   Exercise Vital Sign    Days of Exercise per Week: 0 days    Minutes of Exercise per Session: 0 min  Stress: Stress Concern Present (09/20/2023)   Received from [facility] - Occupational Stress Questionnaire    Feeling of Stress : Rather much  Social Connections: Socially Isolated (09/20/2023)   Received from [facility]   Social Connection and Isolation Panel [NHANES]    Frequency of Communication with Friends and Family: Three times a week    Frequency of Social Gatherings with Friends and Family: Three times a week    Attends Religious Services: Never    Active Member of Clubs or Organizations: No    Attends Banker Meetings: Never    Marital Status: Widowed  Intimate Partner Violence: Unknown (10/07/2022)   Received from [facility]   HITS    Physically Hurt: Not on file    Insult or Talk Down To: Not on file    Threaten Physical Harm: Not on file    Scream or Curse: Not on file    Review of Systems   General: Negative for anorexia, weight loss, fever, chills, fatigue, weakness.  ENT: Negative for hoarseness, difficulty swallowing , nasal congestion. CV: Negative for chest pain, angina, palpitations, dyspnea on exertion, peripheral edema.  Respiratory: Negative for dyspnea at rest, dyspnea on exertion,  cough, sputum, wheezing.  GI: See history of present illness. GU:  Negative for dysuria, hematuria, urinary incontinence, urinary frequency, nocturnal urination.  Endo: Negative for unusual weight change.     Physical Exam   There were no vitals taken for this visit.   General: Well-nourished, well-developed in no acute distress.  Eyes: No icterus. Mouth: Oropharyngeal mucosa moist and pink , no lesions erythema or exudate. Lungs: Clear to auscultation bilaterally.  Heart: Regular rate and rhythm, no murmurs rubs or gallops.  Abdomen: Bowel sounds are normal, nontender, nondistended, no hepatosplenomegaly or masses,  no abdominal bruits or hernia , no rebound or guarding.  Rectal: ***  Extremities: No lower extremity edema. No clubbing or deformities. Neuro: Alert and oriented x 4   Skin: Warm and dry, no jaundice.   Psych: Alert and cooperative, normal mood and affect.  Labs   *** Imaging Studies   No results found.  Assessment       PLAN   ***   Leanna Battles. Melvyn Neth, MHS, PA-C [facility]

## 2024-01-31 ENCOUNTER — Encounter: Payer: Self-pay | Admitting: Gastroenterology

## 2024-01-31 ENCOUNTER — Ambulatory Visit (INDEPENDENT_AMBULATORY_CARE_PROVIDER_SITE_OTHER): Admitting: Gastroenterology

## 2024-01-31 VITALS — BP 139/84 | HR 82 | Temp 98.3°F | Ht 62.0 in | Wt 158.4 lb

## 2024-01-31 DIAGNOSIS — L989 Disorder of the skin and subcutaneous tissue, unspecified: Secondary | ICD-10-CM | POA: Diagnosis not present

## 2024-01-31 DIAGNOSIS — K219 Gastro-esophageal reflux disease without esophagitis: Secondary | ICD-10-CM | POA: Insufficient documentation

## 2024-01-31 DIAGNOSIS — R5383 Other fatigue: Secondary | ICD-10-CM | POA: Diagnosis not present

## 2024-01-31 DIAGNOSIS — K909 Intestinal malabsorption, unspecified: Secondary | ICD-10-CM | POA: Insufficient documentation

## 2024-01-31 DIAGNOSIS — R1010 Upper abdominal pain, unspecified: Secondary | ICD-10-CM

## 2024-01-31 DIAGNOSIS — R109 Unspecified abdominal pain: Secondary | ICD-10-CM | POA: Diagnosis not present

## 2024-01-31 DIAGNOSIS — R070 Pain in throat: Secondary | ICD-10-CM

## 2024-01-31 MED ORDER — PANTOPRAZOLE SODIUM 40 MG PO TBEC: 40.0000 mg | DELAYED_RELEASE_TABLET | Freq: Every day | ORAL | 3 refills | Status: AC

## 2024-01-31 NOTE — Patient Instructions (Signed)
 Please complete labs. We will be in touch with results. Start pantoprazole 40mg  daily before breakfast to see if this helps any of your throat symptoms.  I will look at ENT records available. Please see PCP as planned this week. Be sure to discuss skin lesions with them to help sort out the cause.

## 2024-02-01 LAB — COMPREHENSIVE METABOLIC PANEL WITH GFR
ALT: 15 IU/L (ref 0–32)
AST: 21 IU/L (ref 0–40)
Albumin: 4.5 g/dL (ref 3.9–4.9)
Alkaline Phosphatase: 95 IU/L (ref 44–121)
BUN/Creatinine Ratio: 9 — ABNORMAL LOW (ref 12–28)
BUN: 8 mg/dL (ref 8–27)
Bilirubin Total: 0.3 mg/dL (ref 0.0–1.2)
CO2: 23 mmol/L (ref 20–29)
Calcium: 10.1 mg/dL (ref 8.7–10.3)
Chloride: 104 mmol/L (ref 96–106)
Creatinine, Ser: 0.89 mg/dL (ref 0.57–1.00)
Globulin, Total: 2.7 g/dL (ref 1.5–4.5)
Glucose: 97 mg/dL (ref 70–99)
Potassium: 4.3 mmol/L (ref 3.5–5.2)
Sodium: 143 mmol/L (ref 134–144)
Total Protein: 7.2 g/dL (ref 6.0–8.5)
eGFR: 71 mL/min/{1.73_m2} (ref >59)

## 2024-02-01 LAB — CBC WITH DIFFERENTIAL/PLATELET
Basophils Absolute: 0 10*3/uL (ref 0.0–0.2)
Basos: 1 %
EOS (ABSOLUTE): 0.1 10*3/uL (ref 0.0–0.4)
Eos: 1 %
Hematocrit: 39.8 % (ref 34.0–46.6)
Hemoglobin: 12.8 g/dL (ref 11.1–15.9)
Immature Grans (Abs): 0 10*3/uL (ref 0.0–0.1)
Immature Granulocytes: 0 %
Lymphocytes Absolute: 2.1 10*3/uL (ref 0.7–3.1)
Lymphs: 40 %
MCH: 30.6 pg (ref 26.6–33.0)
MCHC: 32.2 g/dL (ref 31.5–35.7)
MCV: 95 fL (ref 79–97)
Monocytes Absolute: 0.5 10*3/uL (ref 0.1–0.9)
Monocytes: 9 %
Neutrophils Absolute: 2.6 10*3/uL (ref 1.4–7.0)
Neutrophils: 49 %
Platelets: 392 10*3/uL (ref 150–450)
RBC: 4.18 x10E6/uL (ref 3.77–5.28)
RDW: 12 % (ref 11.7–15.4)
WBC: 5.2 10*3/uL (ref 3.4–10.8)

## 2024-02-01 LAB — TSH+FREE T4
Free T4: 1.13 ng/dL (ref 0.82–1.77)
TSH: 0.875 u[IU]/mL (ref 0.450–4.500)

## 2024-02-01 LAB — C-REACTIVE PROTEIN: CRP: 3 mg/L (ref 0–10)

## 2024-02-01 LAB — SEDIMENTATION RATE: Sed Rate: 10 mm/h (ref 0–40)

## 2024-02-01 LAB — LIPASE: Lipase: 40 U/L (ref 14–72)

## 2024-05-01 ENCOUNTER — Encounter (HOSPITAL_COMMUNITY): Payer: Self-pay

## 2024-05-01 ENCOUNTER — Emergency Department (HOSPITAL_COMMUNITY)

## 2024-05-01 ENCOUNTER — Emergency Department (HOSPITAL_COMMUNITY)
Admission: EM | Admit: 2024-05-01 | Discharge: 2024-05-01 | Disposition: A | Discharge status: Home / Self Care | Attending: Emergency Medicine | Admitting: Emergency Medicine

## 2024-05-01 ENCOUNTER — Other Ambulatory Visit: Payer: Self-pay

## 2024-05-01 DIAGNOSIS — Z7982 Long term (current) use of aspirin: Secondary | ICD-10-CM | POA: Insufficient documentation

## 2024-05-01 DIAGNOSIS — J45909 Unspecified asthma, uncomplicated: Secondary | ICD-10-CM | POA: Diagnosis not present

## 2024-05-01 DIAGNOSIS — R1011 Right upper quadrant pain: Secondary | ICD-10-CM | POA: Insufficient documentation

## 2024-05-01 LAB — CBC WITH DIFFERENTIAL/PLATELET
Abs Immature Granulocytes: 0 K/uL (ref 0.00–0.07)
Basophils Absolute: 0 K/uL (ref 0.0–0.1)
Basophils Relative: 1 %
Eosinophils Absolute: 0 K/uL (ref 0.0–0.5)
Eosinophils Relative: 1 %
HCT: 37.8 % (ref 36.0–46.0)
Hemoglobin: 12.6 g/dL (ref 12.0–15.0)
Immature Granulocytes: 0 %
Lymphocytes Relative: 38 %
Lymphs Abs: 2 K/uL (ref 0.7–4.0)
MCH: 31.8 pg (ref 26.0–34.0)
MCHC: 33.3 g/dL (ref 30.0–36.0)
MCV: 95.5 fL (ref 80.0–100.0)
Monocytes Absolute: 0.4 K/uL (ref 0.1–1.0)
Monocytes Relative: 7 %
Neutro Abs: 2.8 K/uL (ref 1.7–7.7)
Neutrophils Relative %: 53 %
Platelets: 331 K/uL (ref 150–400)
RBC: 3.96 MIL/uL (ref 3.87–5.11)
RDW: 12.1 % (ref 11.5–15.5)
WBC: 5.2 K/uL (ref 4.0–10.5)
nRBC: 0 % (ref 0.0–0.2)

## 2024-05-01 LAB — URINALYSIS, ROUTINE W REFLEX MICROSCOPIC
Bilirubin Urine: NEGATIVE
Glucose, UA: NEGATIVE mg/dL
Hgb urine dipstick: NEGATIVE
Ketones, ur: NEGATIVE mg/dL
Leukocytes,Ua: NEGATIVE
Nitrite: NEGATIVE
Protein, ur: NEGATIVE mg/dL
Specific Gravity, Urine: 1.013 (ref 1.005–1.030)
pH: 5 (ref 5.0–8.0)

## 2024-05-01 LAB — COMPREHENSIVE METABOLIC PANEL WITH GFR
ALT: 14 U/L (ref 0–44)
AST: 20 U/L (ref 15–41)
Albumin: 3.8 g/dL (ref 3.5–5.0)
Alkaline Phosphatase: 90 U/L (ref 38–126)
Anion gap: 11 (ref 5–15)
BUN: 6 mg/dL — ABNORMAL LOW (ref 8–23)
CO2: 25 mmol/L (ref 22–32)
Calcium: 9.4 mg/dL (ref 8.9–10.3)
Chloride: 104 mmol/L (ref 98–111)
Creatinine, Ser: 0.88 mg/dL (ref 0.44–1.00)
GFR, Estimated: >60 mL/min (ref >60)
Glucose, Bld: 96 mg/dL (ref 70–99)
Potassium: 4.3 mmol/L (ref 3.5–5.1)
Sodium: 140 mmol/L (ref 135–145)
Total Bilirubin: 0.5 mg/dL (ref 0.0–1.2)
Total Protein: 7.2 g/dL (ref 6.5–8.1)

## 2024-05-01 LAB — LIPASE, BLOOD: Lipase: 41 U/L (ref 11–51)

## 2024-05-01 MED ORDER — IOHEXOL 300 MG/ML  SOLN
100.0000 mL | Freq: Once | INTRAMUSCULAR | Status: AC | PRN
  Administered 2024-05-01: 100 mL via INTRAVENOUS

## 2024-05-01 NOTE — ED Triage Notes (Signed)
 Pt arrived via POV c/o abdominal pain X 2 weeks. Pt reports increased abdominal distention and pressure and reports last BM was 14 days ago. Pt endorses feeling nauseated.

## 2024-05-01 NOTE — ED Provider Notes (Signed)
 [place] EMERGENCY DEPARTMENT AT [facility] Provider Note   CSN: [id number] Arrival date & time: 05/01/24  1435     Patient presents with: Abdominal Pain   Destiny Young is a 68 y.o. female.    Abdominal Pain Patient presents with abdominal pain.  Has had for the last 2 or 3 months.  Upper abdomen.  Goes to the back.  States it will tighten up in her into her back.  Also states not had a bowel movement in 14 days.  States she took various laxatives and other medicines without relief.Right upper quadrant pain no dysuria..  Nissen fundoplication.  Has seen GI and started on a PPI.    Past Medical History:  Diagnosis Date   Allergy    Anxiety    Aortic insufficiency    Arthritis    bulging discs   Asthma    Bradycardia    Burning chest pain    Cancer (HCC)    abnormal PAP   Depression    Ejection fraction    GERD (gastroesophageal reflux disease)    History of Nissen fundoplication   Hemorrhoids    High cholesterol    History of Nissen fundoplication    2000   IBS (irritable bowel syndrome)    Nodular thyroid  disease 06/14/2020   Pelvic pain in female 12/05/2015   Pelvic relaxation 12/05/2015   Rectocele 12/05/2015   Seizures (HCC)    per Dr Milton   Skin tag 12/05/2015   TMJ (dislocation of temporomandibular joint)    Ventricular tachycardia (HCC) 08/21/2013   7 beats ventricular tachycardia, rate 180, 8 day event recorder, July 13, 2013  //   nuclear stress study normal, November, 2014, normal EF by 2-D and nuclear      Prior to Admission medications   Medication Sig Start Date End Date Taking? Authorizing Provider  acetaminophen  (TYLENOL ) 500 MG tablet Take 1,000 mg by mouth every 6 (six) hours as needed for pain.    [provider]  amoxicillin  (AMOXIL ) 500 MG capsule Take by mouth. 01/30/24   [provider]  ARTIFICIAL TEAR SOLUTION OP Place 1 drop into both eyes daily as needed (dry eyes).    [provider]   aspirin EC 81 MG tablet Take 81 mg by mouth daily. Swallow whole.    [provider]  atorvastatin  (LIPITOR) 10 MG tablet Take 10 mg by mouth daily.    [provider]  baclofen (LIORESAL) 10 MG tablet Take 10 mg by mouth 2 (two) times daily. 01/05/24   [provider]  busPIRone (BUSPAR) 10 MG tablet Take 10 mg by mouth 2 (two) times daily. 01/30/24   [provider]  diphenhydrAMINE (BENADRYL) 25 MG tablet Take 25 mg by mouth daily as needed for allergies.    [provider]  ibuprofen  (ADVIL ) 400 MG tablet Take by mouth. 01/06/24   [provider]  metoprolol succinate (TOPROL-XL) 25 MG 24 hr tablet Take 25 mg by mouth daily.    [provider]  Multiple Minerals-Vitamins (CALCIUM -MAGNESIUM-ZINC-D3) TABS Take 1 tablet by mouth daily.    [provider]  multivitamin-lutein (OCUVITE-LUTEIN) CAPS capsule Take 1 capsule by mouth daily.    [provider]  OVER THE COUNTER MEDICATION Take 2 Units by mouth daily. Probiotic/prebiotic gummy    [provider]  pantoprazole  (PROTONIX ) 40 MG tablet Take 1 tablet (40 mg total) by mouth daily before breakfast. 01/31/24   Ezzard Sonny RAMAN, PA-C  PROVENTIL  HFA 108 (90 Base) MCG/ACT inhaler INHALE TWO PUFFS BY MOUTH EVERY 6 HOURS AS NEEDED FOR SHORTNESS OF BREATH Patient taking differently: Inhale 2 puffs into the lungs every 6 (six) hours as needed. 09/13/17   Maranda Jamee Jacob, MD    Allergies: Gluten meal, Lactose, Lactose intolerance (gi), Sulfur , Ciprofloxacin, Codeine, Meperidine  hcl, Metaxalone, Naproxen, Neurontin [gabapentin], Simvastatin, Sulfa antibiotics, Tizanidine, and Trazodone  and nefazodone    Review of Systems  Gastrointestinal:  Positive for abdominal pain.    Updated Vital Signs BP (!) 142/69   Pulse 61   Temp 98.6 F (37 C) (Oral)   Resp 17   Ht 5' 2 (1.575 m) Comment: Simultaneous filing. User may not have seen previous data.  Wt 71.8 kg  Comment: Simultaneous filing. User may not have seen previous data.  SpO2 100%   BMI 28.95 kg/m   Physical Exam Vitals and nursing note reviewed.  HENT:     Head: Atraumatic.  Cardiovascular:     Rate and Rhythm: Regular rhythm.  Abdominal:     Comments: Right upper quadrant or right flank tenderness.  No rash.  No rebound or guarding.  No hernia palpated.  Skin:    General: Skin is warm.  Neurological:     Mental Status: She is alert and oriented to person, place, and time.     (all labs ordered are listed, but only abnormal results are displayed) Labs Reviewed  COMPREHENSIVE METABOLIC PANEL WITH GFR - Abnormal; Notable for the following components:      Result Value   BUN 6 (*)    All other components within normal limits  CBC WITH DIFFERENTIAL/PLATELET  LIPASE, BLOOD  URINALYSIS, ROUTINE W REFLEX MICROSCOPIC    EKG: None  Radiology: CT ABDOMEN PELVIS W CONTRAST Result Date: 05/01/2024 CLINICAL DATA:  Bowel obstruction suspected. Abdominal pain for 2 weeks. Increasing abdominal distention and pressure. Last bowel movement 14 days ago. Nausea. EXAM: CT ABDOMEN AND PELVIS WITH CONTRAST TECHNIQUE: Multidetector CT imaging of the abdomen and pelvis was performed using the standard protocol following bolus administration of intravenous contrast. RADIATION DOSE REDUCTION: This exam was performed according to the departmental dose-optimization program which includes automated exposure control, adjustment of the mA and/or kV according to patient size and/or use of iterative reconstruction technique. CONTRAST:  OMNIPAQUE  IOHEXOL  300 MG/ML  SOLN COMPARISON:  Abdominal radiograph 05/01/2024. CT abdomen pelvis 03/03/2023 FINDINGS: Lower chest: Lung bases are clear.  Small esophageal hiatal hernia. Hepatobiliary: Mild diffuse fatty infiltration of the liver. Surgical absence of the gallbladder. No bile duct dilatation. Pancreas: Unremarkable. No pancreatic ductal dilatation or  surrounding inflammatory changes. Spleen: Normal in size without focal abnormality. Adrenals/Urinary Tract: Adrenal glands are unremarkable. Kidneys are normal, without renal calculi, focal lesion, or hydronephrosis. Bladder is unremarkable. Stomach/Bowel: Stomach, small bowel, and colon are not abnormally distended. Scattered stool throughout the colon. No wall thickening or inflammatory changes. Appendix is normal. Vascular/Lymphatic: Aortic atherosclerosis. No enlarged abdominal or pelvic lymph nodes. Reproductive: Status post hysterectomy. Simple appearing cyst on the right ovary measuring 2.4 cm diameter. No change since prior study. Other: No abdominal wall hernia or abnormality. No abdominopelvic ascites. Musculoskeletal: Degenerative changes in the spine. No acute bony abnormalities. IMPRESSION: 1. No evidence of bowel obstruction or inflammation. Scattered stool in the colon. 2. Mild fatty infiltration of the liver. 3. Small esophageal hiatal hernia. 4. Aortic atherosclerosis. 5. 2.4 cm right ovarian cyst. No follow-up imaging recommended. Note: This recommendation does not apply to premenarchal patients and  to those with increased risk (genetic, family history, elevated tumor markers or other high-risk factors) of ovarian cancer. Reference: JACR 2020 Feb; 17(2):248-254 Electronically Signed   By: Elsie Gravely M.D.   On: 05/01/2024 20:24   DG Abdomen 1 View Result Date: 05/01/2024 CLINICAL DATA:  Increased abdominal distension and pressure with nausea. EXAM: ABDOMEN - 1 VIEW COMPARISON:  None Available. FINDINGS: The bowel gas pattern is normal. A large amount of stool is seen within the ascending colon. Radiopaque surgical clips are present within the right upper quadrant. No radio-opaque calculi or other significant radiographic abnormality are seen. IMPRESSION: Large stool burden within the ascending colon. Electronically Signed   By: Suzen Dials M.D.   On: 05/01/2024 15:48     Procedures    Medications Ordered in the ED  iohexol  (OMNIPAQUE ) 300 MG/ML solution 100 mL (100 mLs Intravenous Contrast Given 05/01/24 1926)                                    Medical Decision Making Amount and/or Complexity of Data Reviewed Labs: ordered. Radiology: ordered.  Risk Prescription drug management.   Patient with abdominal pain.  Right-sided.  Has had for a couple months now.  Bloating at times.  Crampy pain at times.  Differential diagnosis includes constipation, constipation kidney stone and obstruction.  Blood work reassuring.  Will get CT scan to further evaluate.  CT scan done.  Shows somewhat prominent stool but no obstruction or inflammation.  Does have ovarian cyst.  Being postmenopausal will need to be followed.  Will discharge home.      Final diagnoses:  Right upper quadrant abdominal pain    ED Discharge Orders     None          Patsey Lot, MD 05/01/24 2044

## 2024-05-01 NOTE — Discharge Instructions (Addendum)
 The CT scan was reassuring overall.  However there was an ovarian cyst still need to be followed by your PCP.  You can continue to take MiraLAX to help clear you out some.

## 2024-05-01 NOTE — ED Notes (Signed)
Patient transported to CT via wheelchair in [place].

## 2024-05-02 ENCOUNTER — Ambulatory Visit: Admitting: Gastroenterology

## 2024-05-02 NOTE — Progress Notes (Deleted)
 GI Office Note    Referring Provider: Tobie Guy, DO Primary Care Physician:  [facility]  Primary Gastroenterologist: Ozell Hollingshead, MD   Chief Complaint   No chief complaint on file.   History of Present Illness   Destiny Young is a 68 y.o. female presenting today for follow up. Last seen 01/2024. H/o constipation.    History of remote fundoplication in 2001, converted to Toupet fundoplication in 2018 at time of cholecystectomy, also with ventral hernia repair at that time.   EGD in June 2022 with possible stenotic fundoplication status post dilation of the esophagus.  Multiple large gastric ulcers likely NSAID related.  No H. pylori.  Advised to have 16-month surveillance EGD.  Completed EGD April 2023, gastric ulcers had healed.  Due to persistent dysphagia she completed barium pill esophagram May 2023 showing small epiphrenic distal esophageal diverticulum, intact Nissen fundoplication, mild age-related dysmotility.  Barium tablet passed without any issues. Cologuard negative in December 2022.  Multiple complaints at last ov. Genearlly not feeling well, stress, extreme fatigue. Had not seen PCP and in process of establishing with new PCP. Covid 11/2023. Mucous in throat. Cough. Sorethroat. ENT last year with fiberoptic scope showing suspected LPR but no overt findings on scope. Also complained of bloating, uupper abd pain, weight gain, skin lesions.   Labs completed 01/2024*** with normal thyroid  function, inflammatory markers, LFTs, kidney function, lipase. No anemia.  Ask pcp about derm referral  Seen in ED yesterday:  Labs***  CT A/P with contrast 05/01/24:  IMPRESSION: 1. No evidence of bowel obstruction or inflammation. Scattered stool in the colon. 2. Mild fatty infiltration of the liver. 3. Small esophageal hiatal hernia. 4. Aortic atherosclerosis. 5. 2.4 cm right ovarian cyst. No follow-up imaging recommended. Note: This recommendation does not  apply to premenarchal patients and to those with increased risk (genetic, family history, elevated tumor markers or other high-risk factors) of ovarian cancer.  EGD April 2023: -Esophageal diverticulum. Intact fundoplication; previously noted ulcers completely healed Normal duodenal bulb and second portion of the duodenum. - No specimens collected.   EGD June 2022: -Normal esophagus. Snug fundoplication?status post dilation. Multiple large gastric ulcer - status post biopsy -Normal duodenal bulb and second portion of the duodenum -Gastric biopsy showed mild chronic gastritis with ulceration.  No H. pylori.  No intestinal metaplasia. -Recommended 76-month surveillance EGD -It was felt this was likely due to NSAIDs use.   EGD 2016 with intact fundoplication and non H.pylori gastric erosions.  GES (2 hour) 2017 normal with 4.8% retention at 120 minutes.   MRCP 2016 with gallstones but no choledocholithiasis. Colonoscopy 2011 normal colon and TI. Cologuard December 2022: Negative  Medications   Current Outpatient Medications  Medication Sig Dispense Refill   acetaminophen  (TYLENOL ) 500 MG tablet Take 1,000 mg by mouth every 6 (six) hours as needed for pain.     amoxicillin  (AMOXIL ) 500 MG capsule Take by mouth.     ARTIFICIAL TEAR SOLUTION OP Place 1 drop into both eyes daily as needed (dry eyes).     aspirin EC 81 MG tablet Take 81 mg by mouth daily. Swallow whole.     atorvastatin  (LIPITOR) 10 MG tablet Take 10 mg by mouth daily.     baclofen (LIORESAL) 10 MG tablet Take 10 mg by mouth 2 (two) times daily.     busPIRone (BUSPAR) 10 MG tablet Take 10 mg by mouth 2 (two) times daily.     diphenhydrAMINE (BENADRYL) 25 MG  tablet Take 25 mg by mouth daily as needed for allergies.     ibuprofen  (ADVIL ) 400 MG tablet Take by mouth.     metoprolol succinate (TOPROL-XL) 25 MG 24 hr tablet Take 25 mg by mouth daily.     Multiple Minerals-Vitamins (CALCIUM -MAGNESIUM-ZINC-D3) TABS Take 1  tablet by mouth daily.     multivitamin-lutein (OCUVITE-LUTEIN) CAPS capsule Take 1 capsule by mouth daily.     OVER THE COUNTER MEDICATION Take 2 Units by mouth daily. Probiotic/prebiotic gummy     pantoprazole  (PROTONIX ) 40 MG tablet Take 1 tablet (40 mg total) by mouth daily before breakfast. 90 tablet 3   PROVENTIL  HFA 108 (90 Base) MCG/ACT inhaler INHALE TWO PUFFS BY MOUTH EVERY 6 HOURS AS NEEDED FOR SHORTNESS OF BREATH (Patient taking differently: Inhale 2 puffs into the lungs every 6 (six) hours as needed.) 18 g 1   No current facility-administered medications for this visit.    Allergies   Allergies as of 05/02/2024 - Review Complete 05/01/2024  Allergen Reaction Noted   Gluten meal Anaphylaxis and Shortness Of Breath 03/25/2021   Lactose  03/25/2021   Lactose intolerance (gi)  03/25/2021   Sulfur   07/20/2023   Ciprofloxacin Other (See Comments)    Codeine Nausea Only    Meperidine  hcl Nausea And Vomiting    Metaxalone Nausea Only    Naproxen Nausea Only 07/29/2012   Neurontin [gabapentin] Other (See Comments) 12/05/2015   Simvastatin Other (See Comments) 08/21/2013   Sulfa antibiotics Itching    Tizanidine Other (See Comments) 07/05/2013   Trazodone  and nefazodone Other (See Comments) 01/27/2017     Past Medical History   Past Medical History:  Diagnosis Date   Allergy    Anxiety    Aortic insufficiency    Arthritis    bulging discs   Asthma    Bradycardia    Burning chest pain    Cancer (HCC)    abnormal PAP   Depression    Ejection fraction    GERD (gastroesophageal reflux disease)    History of Nissen fundoplication   Hemorrhoids    High cholesterol    History of Nissen fundoplication    2000   IBS (irritable bowel syndrome)    Nodular thyroid  disease 06/14/2020   Pelvic pain in female 12/05/2015   Pelvic relaxation 12/05/2015   Rectocele 12/05/2015   Seizures (HCC)    per Dr Milton   Skin tag 12/05/2015   TMJ (dislocation of temporomandibular  joint)    Ventricular tachycardia (HCC) 08/21/2013   7 beats ventricular tachycardia, rate 180, 8 day event recorder, July 13, 2013  //   nuclear stress study normal, November, 2014, normal EF by 2-D and nuclear     Past Surgical History   Past Surgical History:  Procedure Laterality Date   ABDOMINAL HYSTERECTOMY     ABDOMINAL SURGERY     hernia repair   BIOPSY  03/27/2021   Procedure: BIOPSY;  Surgeon: Shaaron Lamar HERO, MD;  Location: [facility];  Service: Endoscopy;;   CATARACT EXTRACTION W/PHACO Left 09/10/2023   Procedure: CATARACT EXTRACTION PHACO AND INTRAOCULAR LENS PLACEMENT (IOC);  Surgeon: Harrie Agent, MD;  Location: [facility];  Service: Ophthalmology;  Laterality: Left;  CDE: 10.40   CERVICAL ABLATION     CHOLECYSTECTOMY     COLONOSCOPY  02/2010   Dr. Shaaron: Normal terminal ileum to 10 cm, normal colonoscopy.   ESOPHAGOGASTRODUODENOSCOPY  04/14/2006   MFM:[id number]-jeezjmpwh esophagus with esophagogastric junction at 36 cm from the  incisors.  Intact Nissen fundoplication, otherwise normal   ESOPHAGOGASTRODUODENOSCOPY  02/2010   Dr. Shaaron: normal, status post intact Nissen Fundoplication, antral erosions and erythema with chronic gastritis but no H. pylori on biopsies.   ESOPHAGOGASTRODUODENOSCOPY N/A 12/03/2014   Dr.Rourk- intact fundoplication, antral erosions s/p bx. Bx= focally eroded reactive gastopathy negative for hpylori   ESOPHAGOGASTRODUODENOSCOPY (EGD) WITH PROPOFOL  N/A 03/27/2021   Procedure: ESOPHAGOGASTRODUODENOSCOPY (EGD) WITH PROPOFOL ;  Surgeon: Shaaron Lamar HERO, MD;  Location: [facility];  Service: Endoscopy;  Laterality: N/A;  Patient cannot come in any earlier.   ESOPHAGOGASTRODUODENOSCOPY (EGD) WITH PROPOFOL  N/A 02/19/2022   Procedure: ESOPHAGOGASTRODUODENOSCOPY (EGD) WITH PROPOFOL ;  Surgeon: Shaaron Lamar HERO, MD;  Location: [facility];  Service: Endoscopy;  Laterality: N/A;  2:45pm   EXTERNAL EAR SURGERY     HERNIA REPAIR     MALONEY DILATION N/A  03/27/2021   Procedure: MALONEY DILATION;  Surgeon: Shaaron Lamar HERO, MD;  Location: [facility];  Service: Endoscopy;  Laterality: N/A;   NISSEN FUNDOPLICATION  2000   NISSEN FUNDOPLICATION     NOSE SURGERY     SHOULDER SURGERY     TONSILLECTOMY      Past Family History   Family History  Problem Relation Age of Onset   Lung cancer Father    Cancer Father        lung cancer   Irritable bowel syndrome Sister    Irritable bowel syndrome Sister    Hyperlipidemia Sister    Hypertension Sister    Vascular Disease Sister    Hyperlipidemia Sister    Hypertension Sister    Hyperlipidemia Mother    GER disease Mother    Irritable bowel syndrome Mother    Hypertension Brother    Hyperlipidemia Brother    Cancer Brother        lung   Hypertension Son    Hyperlipidemia Son    Stroke Maternal Grandmother    Diabetes Maternal Grandmother    Hypertension Maternal Grandmother    Hyperlipidemia Maternal Grandmother    Hypertension Maternal Grandfather    Heart attack Maternal Grandfather    Diabetes Paternal Grandmother    Hyperlipidemia Paternal Grandmother    Hypertension Paternal Grandmother    Kidney disease Paternal Grandmother    Hypertension Paternal Grandfather    Hyperlipidemia Paternal Grandfather    Heart attack Paternal Grandfather    Diabetes Sister    Hypertension Son    Cancer Paternal Aunt        ovarian    Past Social History   Social History   Socioeconomic History   Marital status: Widowed    Spouse name: Garnette   Number of children: 3   Years of education: 9   Highest education level: Not on file  Occupational History   Not on file  Tobacco Use   Smoking status: Never   Smokeless tobacco: Never  Vaping Use   Vaping status: Never Used  Substance and Sexual Activity   Alcohol use: Yes    Comment: occ glass of red wine, 01-06-2017 RARELY   Drug use: Yes    Types: Marijuana    Comment: occ. 01-06-2017 PER PT [facility]Y [facility]IN [facility]   Sexual  activity: Yes    Birth control/protection: Surgical    Comment: hyst  Other Topics Concern   Not on file  Social History Narrative   Lives independently   Daughter and granddaughter live with her      Disabled- for stomach  problems and orthopedic problems, nerves   Social Drivers of Health   Financial Resource Strain: Low Risk  (03/15/2023)   Received from [facility]   Overall Financial Resource Strain (CARDIA)    Difficulty of Paying Living Expenses: Not hard at all  Food Insecurity: No Food Insecurity (09/20/2023)   Received from [facility]   Hunger Vital Sign    Within the past 12 months, you worried that your food would run out before you got the money to buy more.: Never true    Within the past 12 months, the food you bought just didn't last and you didn't have money to get more.: Never true  Transportation Needs: No Transportation Needs (09/20/2023)   Received from [facility] - Transportation    Lack of Transportation (Medical): No    Lack of Transportation (Non-Medical): No  Physical Activity: Inactive (03/15/2023)   Received from [facility]   Exercise Vital Sign    On average, how many days per week do you engage in moderate to strenuous exercise (like a brisk walk)?: 0 days    On average, how many minutes do you engage in exercise at this level?: 0 min  Stress: Stress Concern Present (09/20/2023)   Received from [facility] - Occupational Stress Questionnaire    Feeling of Stress : Rather much  Social Connections: Socially Isolated (09/20/2023)   Received from [facility]   Social Connection and Isolation Panel    In a typical week, how many times do you talk on the phone with family, friends, or neighbors?: Three times a week    How often do you get together with friends or relatives?: Three times a week    How often do you attend church or religious services?: Never    Do you belong to  any clubs or organizations such as church groups, unions, fraternal or athletic groups, or school groups?: No    How often do you attend meetings of the clubs or organizations you belong to?: Never    Are you married, widowed, divorced, separated, never married, or living with a partner?: Widowed  Intimate Partner Violence: Unknown (10/07/2022)   Received from [facility]   HITS    Physically Hurt: Not on file    Insult or Talk Down To: Not on file    Threaten Physical Harm: Not on file    Scream or Curse: Not on file    Review of Systems   General: Negative for anorexia, weight loss, fever, chills, fatigue, weakness. ENT: Negative for hoarseness, difficulty swallowing , nasal congestion. CV: Negative for chest pain, angina, palpitations, dyspnea on exertion, peripheral edema.  Respiratory: Negative for dyspnea at rest, dyspnea on exertion, cough, sputum, wheezing.  GI: See history of present illness. GU:  Negative for dysuria, hematuria, urinary incontinence, urinary frequency, nocturnal urination.  Endo: Negative for unusual weight change.     Physical Exam   There were no vitals taken for this visit.   General: Well-nourished, well-developed in no acute distress.  Eyes: No icterus. Mouth: Oropharyngeal mucosa moist and pink   Lungs: Clear to auscultation bilaterally.  Heart: Regular rate and rhythm, no murmurs rubs or gallops.  Abdomen: Bowel sounds are normal, nontender, nondistended, no hepatosplenomegaly or masses,  no abdominal bruits or hernia , no rebound or guarding.  Rectal: not performed Extremities: No lower extremity edema. No clubbing or deformities. Neuro: Alert and  oriented x 4   Skin: Warm and dry, no jaundice.   Psych: Alert and cooperative, normal mood and affect.  Labs   Lab Results  Component Value Date   TSH 0.875 01/31/2024    Imaging Studies   CT ABDOMEN PELVIS W CONTRAST Result Date: 05/01/2024 CLINICAL DATA:  Bowel obstruction suspected.  Abdominal pain for 2 weeks. Increasing abdominal distention and pressure. Last bowel movement 14 days ago. Nausea. EXAM: CT ABDOMEN AND PELVIS WITH CONTRAST TECHNIQUE: Multidetector CT imaging of the abdomen and pelvis was performed using the standard protocol following bolus administration of intravenous contrast. RADIATION DOSE REDUCTION: This exam was performed according to the departmental dose-optimization program which includes automated exposure control, adjustment of the mA and/or kV according to patient size and/or use of iterative reconstruction technique. CONTRAST:  OMNIPAQUE  IOHEXOL  300 MG/ML  SOLN COMPARISON:  Abdominal radiograph 05/01/2024. CT abdomen pelvis 03/03/2023 FINDINGS: Lower chest: Lung bases are clear.  Small esophageal hiatal hernia. Hepatobiliary: Mild diffuse fatty infiltration of the liver. Surgical absence of the gallbladder. No bile duct dilatation. Pancreas: Unremarkable. No pancreatic ductal dilatation or surrounding inflammatory changes. Spleen: Normal in size without focal abnormality. Adrenals/Urinary Tract: Adrenal glands are unremarkable. Kidneys are normal, without renal calculi, focal lesion, or hydronephrosis. Bladder is unremarkable. Stomach/Bowel: Stomach, small bowel, and colon are not abnormally distended. Scattered stool throughout the colon. No wall thickening or inflammatory changes. Appendix is normal. Vascular/Lymphatic: Aortic atherosclerosis. No enlarged abdominal or pelvic lymph nodes. Reproductive: Status post hysterectomy. Simple appearing cyst on the right ovary measuring 2.4 cm diameter. No change since prior study. Other: No abdominal wall hernia or abnormality. No abdominopelvic ascites. Musculoskeletal: Degenerative changes in the spine. No acute bony abnormalities. IMPRESSION: 1. No evidence of bowel obstruction or inflammation. Scattered stool in the colon. 2. Mild fatty infiltration of the liver. 3. Small esophageal hiatal hernia. 4. Aortic  atherosclerosis. 5. 2.4 cm right ovarian cyst. No follow-up imaging recommended. Note: This recommendation does not apply to premenarchal patients and to those with increased risk (genetic, family history, elevated tumor markers or other high-risk factors) of ovarian cancer. Reference: JACR 2020 Feb; 17(2):248-254 Electronically Signed   By: Elsie Gravely M.D.   On: 05/01/2024 20:24   DG Abdomen 1 View Result Date: 05/01/2024 CLINICAL DATA:  Increased abdominal distension and pressure with nausea. EXAM: ABDOMEN - 1 VIEW COMPARISON:  None Available. FINDINGS: The bowel gas pattern is normal. A large amount of stool is seen within the ascending colon. Radiopaque surgical clips are present within the right upper quadrant. No radio-opaque calculi or other significant radiographic abnormality are seen. IMPRESSION: Large stool burden within the ascending colon. Electronically Signed   By: Suzen Dials M.D.   On: 05/01/2024 15:48    Assessment/Plan:        PIERRETTE Sonny RAMAN. Ezzard, MHS, PA-C [facility]

## 2024-05-04 NOTE — Progress Notes (Signed)
 Referring Provider: Medicine, [facility] Internal Primary Care Physician:  Medicine, [facility] Internal Primary GI Physician: Dr. Shaaron  Chief Complaint  Patient presents with   Follow-up    Follow up from ED    HPI:   Destiny Young is a 68 y.o. female presenting today with chief complaint of constipation, abdominal pain, GERD.  She has GI history of constipation, remote fundoplication in 2001, converted to to Toupet fundoplication in 2018 at time of cholecystectomy and ventral hernia repair. History of PUD 2022, dysphagia.    Last seen in our office 01/31/24 with multiple complaints including fatigue, fatty stools, sore throat/mucus/cough with suspected LPR per ENT, upper abdominal pain.  She was started on pantoprazole  40 mg daily and labs ordered.  Labs 01/31/24: CBC, CMP, lipase, CRP, sed rate, TSH and free T4 all within normal limits.   Patient was seen in the emergency room 05/01/2024 reporting upper abdominal pain x 2-3 months radiating to her back.  No BM in 14 days.  She had no acute lab abnormalities.  Abdominal x-ray with large amount of stool in the ascending colon.  CT A/P with contrast with scattered stool in the colon, mild fatty infiltration of the liver, small hiatal hernia, 2.4 cm right ovarian cyst.    Today: 17 days or more since she has had a good BM. Feels nauseated. No vomiting. Passing some gas. Passed a very small amount of stool last night. Constipation is chronic since having her daughter in 14. No brbpr or melena.   Tried MiraLAX multiple days in a row, MOM, exlax, prune juice, apples, but nothing seems to be helping.   Has been having RLQ abdominal pain, but does not seem to bother her as much today.  Upper abdominal pain has resoled. Taking pantoprazole  40 mg daily. Still having heartburn daily.    NSAIDs: Denies.     Prior Evaluation:  Barium pill esophagram May 2023 for persistent dysphagia with small epiphrenic  distal esophageal diverticulum, intact  Nissen fundoplication, mild age-related dysmotility. Barium tablet passed without any issues.  EGD April 2023: -Esophageal diverticulum. Intact fundoplication; previously noted ulcers completely healed Normal duodenal bulb and second portion of the duodenum. - No specimens collected.   EGD June 2022: -Normal esophagus. Snug fundoplication?status post dilation. Multiple large gastric ulcer - status post biopsy -Normal duodenal bulb and second portion of the duodenum -Gastric biopsy showed mild chronic gastritis with ulceration.  No H. pylori.  No intestinal metaplasia. -Recommended 65-month surveillance EGD -It was felt this was likely due to NSAIDs use.   EGD 2016 with intact fundoplication and non H.pylori gastric erosions.   GES (2 hour) 2017 normal with 4.8% retention at 120 minutes.    MRCP 2016 with gallstones but no choledocholithiasis.  Colonoscopy 2011 normal colon and TI.  Cologuard December 2022: Negative   Past Medical History:  Diagnosis Date   Allergy    Anxiety    Aortic insufficiency    Arthritis    bulging discs   Asthma    Bradycardia    Burning chest pain    Cancer (HCC)    abnormal PAP   Depression    Ejection fraction    GERD (gastroesophageal reflux disease)    History of Nissen fundoplication   Hemorrhoids    High cholesterol    History of Nissen fundoplication    2000   IBS (irritable bowel syndrome)    Nodular thyroid  disease 06/14/2020   Pelvic pain in female 12/05/2015  Pelvic relaxation 12/05/2015   Rectocele 12/05/2015   Seizures (HCC)    per Dr Milton   Skin tag 12/05/2015   TMJ (dislocation of temporomandibular joint)    Ventricular tachycardia (HCC) 08/21/2013   7 beats ventricular tachycardia, rate 180, 8 day event recorder, July 13, 2013  //   nuclear stress study normal, November, 2014, normal EF by 2-D and nuclear     Past Surgical History:  Procedure Laterality Date   ABDOMINAL HYSTERECTOMY     ABDOMINAL SURGERY      hernia repair   BIOPSY  03/27/2021   Procedure: BIOPSY;  Surgeon: Shaaron Lamar HERO, MD;  Location: [facility];  Service: Endoscopy;;   CATARACT EXTRACTION W/PHACO Left 09/10/2023   Procedure: CATARACT EXTRACTION PHACO AND INTRAOCULAR LENS PLACEMENT (IOC);  Surgeon: Harrie Agent, MD;  Location: [facility];  Service: Ophthalmology;  Laterality: Left;  CDE: 10.40   CERVICAL ABLATION     CHOLECYSTECTOMY     COLONOSCOPY  02/2010   Dr. Shaaron: Normal terminal ileum to 10 cm, normal colonoscopy.   ESOPHAGOGASTRODUODENOSCOPY  04/14/2006   MFM:[id number]-jeezjmpwh esophagus with esophagogastric junction at 36 cm from the incisors.  Intact Nissen fundoplication, otherwise normal   ESOPHAGOGASTRODUODENOSCOPY  02/2010   Dr. Shaaron: normal, status post intact Nissen Fundoplication, antral erosions and erythema with chronic gastritis but no H. pylori on biopsies.   ESOPHAGOGASTRODUODENOSCOPY N/A 12/03/2014   Dr.Rourk- intact fundoplication, antral erosions s/p bx. Bx= focally eroded reactive gastopathy negative for hpylori   ESOPHAGOGASTRODUODENOSCOPY (EGD) WITH PROPOFOL  N/A 03/27/2021   Procedure: ESOPHAGOGASTRODUODENOSCOPY (EGD) WITH PROPOFOL ;  Surgeon: Shaaron Lamar HERO, MD;  [facility];  Service: Endoscopy;  Laterality: N/A;  Patient cannot come in any earlier.   ESOPHAGOGASTRODUODENOSCOPY (EGD) WITH PROPOFOL  N/A 02/19/2022   Procedure: ESOPHAGOGASTRODUODENOSCOPY (EGD) WITH PROPOFOL ;  Surgeon: Shaaron Lamar HERO, MD;  [facility]: [facility];  Service: Endoscopy;  Laterality: N/A;  2:45pm   EXTERNAL EAR SURGERY     HERNIA REPAIR     MALONEY DILATION N/A 03/27/2021   Procedure: MALONEY DILATION;  Surgeon: Shaaron Lamar HERO, MD;  Location: [facility];  Service: Endoscopy;  Laterality: N/A;   NISSEN FUNDOPLICATION  2000   NISSEN FUNDOPLICATION     NOSE SURGERY     SHOULDER SURGERY     TONSILLECTOMY      Current Outpatient Medications  Medication Sig Dispense Refill   acetaminophen  (TYLENOL )  500 MG tablet Take 1,000 mg by mouth every 6 (six) hours as needed for pain.     ARTIFICIAL TEAR SOLUTION OP Place 1 drop into both eyes daily as needed (dry eyes).     aspirin EC 81 MG tablet Take 81 mg by mouth daily. Swallow whole.     atorvastatin  (LIPITOR) 10 MG tablet Take 10 mg by mouth daily.     baclofen (LIORESAL) 10 MG tablet Take 10 mg by mouth 2 (two) times daily.     busPIRone (BUSPAR) 10 MG tablet Take 10 mg by mouth 2 (two) times daily.     diphenhydrAMINE (BENADRYL) 25 MG tablet Take 25 mg by mouth daily as needed for allergies.     ibuprofen  (ADVIL ) 400 MG tablet Take by mouth.     metoprolol succinate (TOPROL-XL) 25 MG 24 hr tablet Take 25 mg by mouth daily.     Multiple Minerals-Vitamins (CALCIUM -MAGNESIUM-ZINC-D3) TABS Take 1 tablet by mouth daily.     multivitamin-lutein (OCUVITE-LUTEIN) CAPS capsule Take 1 capsule by mouth daily.     OVER  THE COUNTER MEDICATION Take 2 Units by mouth daily. Probiotic/prebiotic gummy     pantoprazole  (PROTONIX ) 40 MG tablet Take 1 tablet (40 mg total) by mouth daily before breakfast. 90 tablet 3   polyethylene glycol-electrolytes (NULYTELY) 420 g solution Take 4,000 mLs by mouth once for 1 dose. 4000 mL 0   PROVENTIL  HFA 108 (90 Base) MCG/ACT inhaler INHALE TWO PUFFS BY MOUTH EVERY 6 HOURS AS NEEDED FOR SHORTNESS OF BREATH 18 g 1   amoxicillin  (AMOXIL ) 500 MG capsule Take by mouth. (Patient not taking: Reported on 05/05/2024)     No current facility-administered medications for this visit.    Allergies as of 05/05/2024 - Review Complete 05/05/2024  Allergen Reaction Noted   Gluten meal Anaphylaxis and Shortness Of Breath 03/25/2021   Lactose  03/25/2021   Lactose intolerance (gi)  03/25/2021   Sulfur   07/20/2023   Ciprofloxacin Other (See Comments)    Codeine Nausea Only    Meperidine  hcl Nausea And Vomiting    Metaxalone Nausea Only    Naproxen Nausea Only 07/29/2012   Neurontin [gabapentin] Other (See Comments) 12/05/2015    Simvastatin Other (See Comments) 08/21/2013   Sulfa antibiotics Itching    Tizanidine Other (See Comments) 07/05/2013   Trazodone  and nefazodone Other (See Comments) 01/27/2017    Family History  Problem Relation Age of Onset   Lung cancer Father    Cancer Father        lung cancer   Irritable bowel syndrome Sister    Irritable bowel syndrome Sister    Hyperlipidemia Sister    Hypertension Sister    Vascular Disease Sister    Hyperlipidemia Sister    Hypertension Sister    Hyperlipidemia Mother    GER disease Mother    Irritable bowel syndrome Mother    Hypertension Brother    Hyperlipidemia Brother    Cancer Brother        lung   Hypertension Son    Hyperlipidemia Son    Stroke Maternal Grandmother    Diabetes Maternal Grandmother    Hypertension Maternal Grandmother    Hyperlipidemia Maternal Grandmother    Hypertension Maternal Grandfather    Heart attack Maternal Grandfather    Diabetes Paternal Grandmother    Hyperlipidemia Paternal Grandmother    Hypertension Paternal Grandmother    Kidney disease Paternal Grandmother    Hypertension Paternal Grandfather    Hyperlipidemia Paternal Grandfather    Heart attack Paternal Grandfather    Diabetes Sister    Hypertension Son    Cancer Paternal Aunt        ovarian    Social History   Socioeconomic History   Marital status: Widowed    Spouse name: Garnette   Number of children: 3   Years of education: 9   Highest education level: Not on file  Occupational History   Not on file  Tobacco Use   Smoking status: Never   Smokeless tobacco: Never  Vaping Use   Vaping status: Never Used  Substance and Sexual Activity   Alcohol use: Yes    Comment: occ glass of red wine, 01-06-2017 RARELY   Drug use: Yes    Types: Marijuana    Comment: occ. 01-06-2017 PER PT TRY [facility] PAIN IN [facility]   Sexual activity: Yes    Birth control/protection: Surgical    Comment: hyst  Other Topics Concern   Not on file  Social  History Narrative   Lives independently   Daughter and granddaughter live with  her      Disabled- for stomach problems and orthopedic problems, nerves   Social Drivers of Health   Financial Resource Strain: Low Risk  (03/15/2023)   Received from [facility]   Overall Financial Resource Strain (CARDIA)    Difficulty of Paying Living Expenses: Not hard at all  Food Insecurity: No Food Insecurity (09/20/2023)   Received from [facility]   Hunger Vital Sign    Within the past 12 months, you worried that your food would run out before you got the money to buy more.: Never true    Within the past 12 months, the food you bought just didn't last and you didn't have money to get more.: Never true  Transportation Needs: No Transportation Needs (09/20/2023)   Received from [facility] - Transportation    Lack of Transportation (Medical): No    Lack of Transportation (Non-Medical): No  Physical Activity: Inactive (03/15/2023)   Received from [facility]   Exercise Vital Sign    On average, how many days per week do you engage in moderate to strenuous exercise (like a brisk walk)?: 0 days    On average, how many minutes do you engage in exercise at this level?: 0 min  Stress: Stress Concern Present (09/20/2023)   Received from [facility] - Occupational Stress Questionnaire    Feeling of Stress : Rather much  Social Connections: Socially Isolated (09/20/2023)   Received from [facility]   Social Connection and Isolation Panel    In a typical week, how many times do you talk on the phone with family, friends, or neighbors?: Three times a week    How often do you get together with friends or relatives?: Three times a week    How often do you attend church or religious services?: Never    Do you belong to any clubs or organizations such as church groups, unions, fraternal or athletic groups, or school groups?: No    How  often do you attend meetings of the clubs or organizations you belong to?: Never    Are you married, widowed, divorced, separated, never married, or living with a partner?: Widowed    Review of Systems: Gen: Denies fever, chills, cold or flulike symptoms, presyncope, syncope. GI: See HPI Heme: See HPI  Physical Exam: BP (!) 158/75   Pulse 66   Temp 98.7 F (37.1 C)   Ht 5' 2.5 (1.588 m)   Wt 157 lb 9.6 oz (71.5 kg)   BMI 28.37 kg/m  General:   Alert and oriented. No distress noted. Pleasant and cooperative.  Head:  Normocephalic and atraumatic. Eyes:  Conjuctiva clear without scleral icterus. Abdomen:  +BS, soft, non-tender and non-distended. No rebound or guarding. No HSM or masses noted. Msk:  Symmetrical without gross deformities. Normal posture. Extremities:  Without edema. Neurologic:  Alert and  oriented x4 Psych:  Normal mood and affect.    Assessment:  68 year old female with GI history of constipation, remote fundoplication in 2001, converted to Toupet fundoplication in 2018 at time of cholecystectomy and ventral hernia repair, PUD in 2022, dysphagia, presenting today for further evaluation of constipation, abdominal pain, heartburn.  Constipation: Chronic.  Worsening symptoms over the last few weeks with associated abdominal pain.  Waited in the ER 05/01/2024.  Abdominal x-ray with large amount of stool in the ascending colon.  CT A/P with contrast showed scattered stool throughout  the colon, 2.4 cm right ovarian cyst (stable).  She has tried multiple over-the-counter agents without improvement. Abdominal exam is benign today with normal bowel sounds.  I will have her complete TriLyte bowel prep, then start Linzess  290 mcg daily.   Note, she is overdue for colon cancer screening with last colonoscopy in 2011.  She did have negative Cologuard in 2022, but in the setting of worsening/severe constipation, recommended updating colonoscopy once we get her bowels moving a little  better.  Encouragingly, she has not had any BRBPR or melena.  RLQ abdominal pain:  Likely secondary to severe constipation addressed above.   Heartburn:  Reporting daily heartburn despite pantoprazole . I do not hiatal hernia on recent CT which may be making her more prone to experiencing heartburn.  Uncontrolled constipation may also be contributing.  Last EGD in April 2023 with esophageal diverticulum, intact fundoplication at that time.  Will increase pantoprazole  twice daily for now.  If persistent symptoms, would consider repeating EGD for further evaluation.     Plan:  Complete TriLyte bowel prep.  Separate written instructions were provided on how to complete this prep and recommended clear liquid diet while completing her prep. 1-2 days after completing bowel prep, she will start Linzess  290 mcg daily. Increase pantoprazole  to 40 mg twice a day, 30 minutes before breakfast and 30 minutes for dinner. Reinforced GERD diet/lifestyle. Follow-up in 2 weeks.  Will need to discuss scheduling colonoscopy at that time.   Josette Centers, PA-C [facility] 05/05/2024

## 2024-05-05 ENCOUNTER — Ambulatory Visit (INDEPENDENT_AMBULATORY_CARE_PROVIDER_SITE_OTHER): Admitting: Gastroenterology

## 2024-05-05 ENCOUNTER — Encounter: Payer: Self-pay | Admitting: Gastroenterology

## 2024-05-05 VITALS — BP 158/75 | HR 66 | Temp 98.7°F | Ht 62.5 in | Wt 157.6 lb

## 2024-05-05 DIAGNOSIS — K449 Diaphragmatic hernia without obstruction or gangrene: Secondary | ICD-10-CM

## 2024-05-05 DIAGNOSIS — K5900 Constipation, unspecified: Secondary | ICD-10-CM

## 2024-05-05 DIAGNOSIS — R12 Heartburn: Secondary | ICD-10-CM

## 2024-05-05 DIAGNOSIS — R1031 Right lower quadrant pain: Secondary | ICD-10-CM

## 2024-05-05 DIAGNOSIS — K5909 Other constipation: Secondary | ICD-10-CM

## 2024-05-05 MED ORDER — PEG 3350-KCL-NA BICARB-NACL 420 G PO SOLR: 4000.0000 mL | Freq: Once | ORAL | 0 refills | Status: AC

## 2024-05-05 NOTE — Patient Instructions (Signed)
 I am sending in a bowel prep for you. Please see instructions below.   After you complete the bowel prep, you can start Linzess  290 mcg daily the following day or day after.  You will need to take Linzess  every morning, 30 minutes before breakfast to keep your bowels moving.  We are providing you with samples of Linzess  today.  Please let me know how they work for you sometime next week.  If it works well, I will send a prescription to your pharmacy.  If it ends up being too strong, we can try a lower dose.  Due to uncontrolled heartburn, increase pantoprazole  to 40 mg twice a day, 30 minutes before breakfast and dinner.  I am sending in an updated prescription to your pharmacy.  Follow a GERD diet:  Avoid fried, fatty, greasy, spicy, citrus foods. Avoid caffeine and carbonated beverages. Avoid chocolate. Try eating 4-6 small meals a day rather than 3 large meals. Do not eat within 3 hours of laying down. Prop head of bed up on wood or bricks to create a 6 inch incline.  I will see you back in 2 weeks.   Josette Centers, PA-C [facility]   Bowel prep instructions: Purchase 1 box of Bisacodyl 5mg  tablets over the counter and Trilyte/Nulytely bowel prep from the pharmacy.   Clear liquid diet all day the day you complete the bowel prep.    At 10:00 am:  Take 2 Bisacodyl tablets.   At 12:00pm:  Start drinking your solution. Try to drink 1 (one) 8 ounce glass every 10-15 minutes until you have consumed HALF the jug. If you bowels are moving very well, you do not have to complete the prep. However, if you aren't moving your bowels well, wait about 30 minuets then complete the rest of the prep as before.   Continue clear liquids. You must drink plenty of clear liquids to prevent dehyration and kidney failure.     The day after you complete the colon prep, if your bowels are still moving very well, you can hold off on taking Linzess  until the next day, then you will be taking  Linzess  every day moving forward to keep your bowels moving well.

## 2024-05-26 ENCOUNTER — Ambulatory Visit: Admitting: Gastroenterology

## 2024-06-04 LAB — COLOGUARD: COLOGUARD: NEGATIVE

## 2024-06-05 ENCOUNTER — Emergency Department (HOSPITAL_COMMUNITY)

## 2024-06-05 ENCOUNTER — Emergency Department (HOSPITAL_COMMUNITY)
Admission: EM | Admit: 2024-06-05 | Discharge: 2024-06-05 | Disposition: A | Discharge status: Home / Self Care | Attending: Emergency Medicine | Admitting: Emergency Medicine

## 2024-06-05 DIAGNOSIS — Z859 Personal history of malignant neoplasm, unspecified: Secondary | ICD-10-CM | POA: Insufficient documentation

## 2024-06-05 DIAGNOSIS — R1013 Epigastric pain: Secondary | ICD-10-CM | POA: Insufficient documentation

## 2024-06-05 DIAGNOSIS — R42 Dizziness and giddiness: Secondary | ICD-10-CM | POA: Diagnosis not present

## 2024-06-05 DIAGNOSIS — Z7982 Long term (current) use of aspirin: Secondary | ICD-10-CM | POA: Insufficient documentation

## 2024-06-05 DIAGNOSIS — R519 Headache, unspecified: Secondary | ICD-10-CM | POA: Insufficient documentation

## 2024-06-05 DIAGNOSIS — Z79899 Other long term (current) drug therapy: Secondary | ICD-10-CM | POA: Insufficient documentation

## 2024-06-05 DIAGNOSIS — J45909 Unspecified asthma, uncomplicated: Secondary | ICD-10-CM | POA: Insufficient documentation

## 2024-06-05 LAB — COMPREHENSIVE METABOLIC PANEL WITH GFR
ALT: 25 U/L (ref 0–44)
AST: 27 U/L (ref 15–41)
Albumin: 4.2 g/dL (ref 3.5–5.0)
Alkaline Phosphatase: 95 U/L (ref 38–126)
Anion gap: 10 (ref 5–15)
BUN: 9 mg/dL (ref 8–23)
CO2: 26 mmol/L (ref 22–32)
Calcium: 10.1 mg/dL (ref 8.9–10.3)
Chloride: 104 mmol/L (ref 98–111)
Creatinine, Ser: 0.8 mg/dL (ref 0.44–1.00)
GFR, Estimated: >60 mL/min (ref >60)
Glucose, Bld: 98 mg/dL (ref 70–99)
Potassium: 3.6 mmol/L (ref 3.5–5.1)
Sodium: 140 mmol/L (ref 135–145)
Total Bilirubin: 0.7 mg/dL (ref 0.0–1.2)
Total Protein: 7.8 g/dL (ref 6.5–8.1)

## 2024-06-05 LAB — CBC WITH DIFFERENTIAL/PLATELET
Abs Immature Granulocytes: 0.01 K/uL (ref 0.00–0.07)
Basophils Absolute: 0 K/uL (ref 0.0–0.1)
Basophils Relative: 0 %
Eosinophils Absolute: 0 K/uL (ref 0.0–0.5)
Eosinophils Relative: 0 %
HCT: 40.8 % (ref 36.0–46.0)
Hemoglobin: 13.5 g/dL (ref 12.0–15.0)
Immature Granulocytes: 0 %
Lymphocytes Relative: 37 %
Lymphs Abs: 2.1 K/uL (ref 0.7–4.0)
MCH: 31.7 pg (ref 26.0–34.0)
MCHC: 33.1 g/dL (ref 30.0–36.0)
MCV: 95.8 fL (ref 80.0–100.0)
Monocytes Absolute: 0.4 K/uL (ref 0.1–1.0)
Monocytes Relative: 7 %
Neutro Abs: 3.1 K/uL (ref 1.7–7.7)
Neutrophils Relative %: 56 %
Platelets: 338 K/uL (ref 150–400)
RBC: 4.26 MIL/uL (ref 3.87–5.11)
RDW: 12.1 % (ref 11.5–15.5)
WBC: 5.6 K/uL (ref 4.0–10.5)
nRBC: 0 % (ref 0.0–0.2)

## 2024-06-05 LAB — TROPONIN I (HIGH SENSITIVITY): Troponin I (High Sensitivity): 4 ng/L (ref <18)

## 2024-06-05 LAB — URINALYSIS, ROUTINE W REFLEX MICROSCOPIC
Bilirubin Urine: NEGATIVE
Glucose, UA: NEGATIVE mg/dL
Hgb urine dipstick: NEGATIVE
Ketones, ur: NEGATIVE mg/dL
Leukocytes,Ua: NEGATIVE
Nitrite: NEGATIVE
Protein, ur: NEGATIVE mg/dL
Specific Gravity, Urine: 1.002 — ABNORMAL LOW (ref 1.005–1.030)
pH: 6 (ref 5.0–8.0)

## 2024-06-05 LAB — MAGNESIUM: Magnesium: 2.2 mg/dL (ref 1.7–2.4)

## 2024-06-05 MED ORDER — METOCLOPRAMIDE HCL 5 MG/ML IJ SOLN
5.0000 mg | Freq: Once | INTRAMUSCULAR | Status: AC
  Administered 2024-06-05 (×2): 5 mg via INTRAVENOUS
  Filled 2024-06-05: qty 2

## 2024-06-05 NOTE — Discharge Instructions (Addendum)
 Your workup was reassuring today.  Head CT did not show a cause of the pain and the blood work was reassuring.  Follow-up with your doctors.

## 2024-06-05 NOTE — ED Triage Notes (Addendum)
 Pt has been having numbness, tingling, h/a, nausea for the past 5-6 days. Pt states numbness and tingling are in her legs.   Pt has been noticing her BP has been elevated and has been taking her emergency BP meds.   Last BM last night.   Pt is have indigestion.  Pt is A&Ox4.

## 2024-06-05 NOTE — ED Provider Notes (Signed)
 [facility] AT [facility] Provider Note   CSN: [id number] Arrival date & time: 06/05/24  1805     Patient presents with: Dizziness and Headache   Destiny Young is a 68 y.o. female.    Dizziness Associated symptoms: headaches   Headache Associated symptoms: dizziness   Patient has had a right sided headache for close to a week now.  Blood pressure has been both elevated and low at times.  States she will get some pain in the right side of her face.  States it will tingle.  States both her arms at times will feel tingly and she will feel bad.  Also has indigestion at times.  Pain will come from her epigastric area to her chest.    Past Medical History:  Diagnosis Date   Allergy    Anxiety    Aortic insufficiency    Arthritis    bulging discs   Asthma    Bradycardia    Burning chest pain    Cancer (HCC)    abnormal PAP   Depression    Ejection fraction    GERD (gastroesophageal reflux disease)    History of Nissen fundoplication   Hemorrhoids    High cholesterol    History of Nissen fundoplication    2000   IBS (irritable bowel syndrome)    Nodular thyroid  disease 06/14/2020   Pelvic pain in female 12/05/2015   Pelvic relaxation 12/05/2015   Rectocele 12/05/2015   Seizures (HCC)    per Dr Milton   Skin tag 12/05/2015   TMJ (dislocation of temporomandibular joint)    Ventricular tachycardia (HCC) 08/21/2013   7 beats ventricular tachycardia, rate 180, 8 day event recorder, July 13, 2013  //   nuclear stress study normal, November, 2014, normal EF by 2-D and nuclear     Prior to Admission medications   Medication Sig Start Date End Date Taking? Authorizing Provider  acetaminophen  (TYLENOL ) 500 MG tablet Take 1,000 mg by mouth every 6 (six) hours as needed for pain.    [provider]  amoxicillin  (AMOXIL ) 500 MG capsule Take by mouth. Patient not taking: Reported on 05/05/2024 01/30/24   [provider]   ARTIFICIAL TEAR SOLUTION OP Place 1 drop into both eyes daily as needed (dry eyes).    [provider]  aspirin EC 81 MG tablet Take 81 mg by mouth daily. Swallow whole.    [provider]  atorvastatin  (LIPITOR) 10 MG tablet Take 10 mg by mouth daily.    [provider]  baclofen (LIORESAL) 10 MG tablet Take 10 mg by mouth 2 (two) times daily. 01/05/24   [provider]  busPIRone (BUSPAR) 10 MG tablet Take 10 mg by mouth 2 (two) times daily. 01/30/24   [provider]  diphenhydrAMINE (BENADRYL) 25 MG tablet Take 25 mg by mouth daily as needed for allergies.    [provider]  ibuprofen  (ADVIL ) 400 MG tablet Take by mouth. 01/06/24   [provider]  metoprolol succinate (TOPROL-XL) 25 MG 24 hr tablet Take 25 mg by mouth daily.    [provider]  Multiple Minerals-Vitamins (CALCIUM -MAGNESIUM-ZINC-D3) TABS Take 1 tablet by mouth daily.    [provider]  multivitamin-lutein (OCUVITE-LUTEIN) CAPS capsule Take 1 capsule by mouth daily.    [provider]  OVER THE COUNTER MEDICATION Take 2 Units by mouth daily. Probiotic/prebiotic gummy    [provider]  pantoprazole  (PROTONIX ) 40 MG tablet Take  1 tablet (40 mg total) by mouth daily before breakfast. 01/31/24   Ezzard Sonny RAMAN, PA-C  PROVENTIL  HFA 108 (90 Base) MCG/ACT inhaler INHALE TWO PUFFS BY MOUTH EVERY 6 HOURS AS NEEDED FOR SHORTNESS OF BREATH 09/13/17   Maranda Jamee Jacob, MD    Allergies: Gluten meal, Lactose, Lactose intolerance (gi), Maalox [calcium  carbonate antacid], Sulfur , Ciprofloxacin, Codeine, Meperidine  hcl, Metaxalone, Naproxen, Neurontin [gabapentin], Simvastatin, Sulfa antibiotics, Tizanidine, and Trazodone  and nefazodone    Review of Systems  Neurological:  Positive for dizziness and headaches.    Updated Vital Signs BP 139/62   Pulse 72   Temp 98.5 F (36.9 C) (Oral)   Resp 19   Ht 5' 2 (1.575 m)   Wt 70.8 kg   SpO2  100%   BMI 28.53 kg/m   Physical Exam Vitals and nursing note reviewed.  HENT:     Head: Atraumatic.  Eyes:     Extraocular Movements: Extraocular movements intact.     Pupils: Pupils are equal, round, and reactive to light.  Cardiovascular:     Rate and Rhythm: Normal rate.  Pulmonary:     Breath sounds: No wheezing.  Musculoskeletal:     Cervical back: Neck supple.  Skin:    General: Skin is warm.  Neurological:     Mental Status: She is alert.     (all labs ordered are listed, but only abnormal results are displayed) Labs Reviewed  URINALYSIS, ROUTINE W REFLEX MICROSCOPIC - Abnormal; Notable for the following components:      Result Value   Color, Urine COLORLESS (*)    Specific Gravity, Urine 1.002 (*)    All other components within normal limits  CBC WITH DIFFERENTIAL/PLATELET  COMPREHENSIVE METABOLIC PANEL WITH GFR  MAGNESIUM  TROPONIN I (HIGH SENSITIVITY)  TROPONIN I (HIGH SENSITIVITY)    EKG: EKG Interpretation Date/Time:  Monday June 05 2024 18:32:14 EDT Ventricular Rate:  85 PR Interval:  166 QRS Duration:  102 QT Interval:  348 QTC Calculation: 414 R Axis:   -28  Text Interpretation: Normal sinus rhythm Moderate voltage criteria for LVH, may be normal variant ( R in aVL , Cornell product ) Septal infarct (cited on or before 20-Aug-2015) Abnormal ECG When compared with ECG of 15-Nov-2017 14:40, QRS axis Shifted left Questionable change in initial forces of Septal leads Confirmed by Patsey Lot [phone]) on 06/05/2024 8:50:14 PM  Radiology: CT Head Wo Contrast Result Date: 06/05/2024 CLINICAL DATA:  Headache, increasing frequency or severity EXAM: CT HEAD WITHOUT CONTRAST TECHNIQUE: Contiguous axial images were obtained from the base of the skull through the vertex without intravenous contrast. RADIATION DOSE REDUCTION: This exam was performed according to the departmental dose-optimization program which includes automated exposure control, adjustment  of the mA and/or kV according to patient size and/or use of iterative reconstruction technique. COMPARISON:  Head CT 06/18/2012, brain MRI 01/15/2023 FINDINGS: Brain: No intracranial hemorrhage, mass effect, or midline shift. No hydrocephalus. The basilar cisterns are patent. No evidence of territorial infarct or acute ischemia. No extra-axial or intracranial fluid collection. Vascular: Atherosclerosis of skullbase vasculature without hyperdense vessel or abnormal calcification. Skull: No fracture or focal lesion. Sinuses/Orbits: Partial opacification of lower left mastoid air cells, chronic. No acute findings. Other: None. IMPRESSION: No acute intracranial abnormality. Electronically Signed   By: Andrea Gasman M.D.   On: 06/05/2024 22:34     Procedures   Medications Ordered in the ED  metoCLOPramide  (REGLAN ) injection 5 mg (5 mg Intravenous Given 06/05/24 2206)  Medical Decision Making Amount and/or Complexity of Data Reviewed Labs: ordered. Radiology: ordered.  Risk Prescription drug management.   Patient also complaints.  Headache blurred vision on left eye at times.  Chest pain paresthesias to face.  Sometimes extremity paresthesias.  Has been going for about a week.  Differential diagnosis along with includes causes such as migraines.  Will get head CT.  Blood pressure more normal here than reported at home.  EKG nonspecific changes.  Will get troponin.  Will also treat with some Reglan  for the headache.  Headache feeling somewhat better.  Blood work reassuring.  Head head CT reassuring.  Doubt cause of the symptoms such as aortic dissection.  Appears stable for discharge home.  Will follow with PCP.     Final diagnoses:  Bad headache  Epigastric pain    ED Discharge Orders     None          Patsey Lot, MD 06/05/24 2257

## 2024-06-05 NOTE — ED Notes (Signed)
 AVS provided by edp was reviewed with the pt. Pt verbalized understanding with no additional questions at this time.

## 2024-08-30 ENCOUNTER — Telehealth: Payer: Self-pay | Admitting: *Deleted

## 2024-08-30 NOTE — Telephone Encounter (Signed)
 EDFU please advise

## 2024-08-31 NOTE — Telephone Encounter (Signed)
 Returned patient's call.  States she was seen in the ED back in July and was found to have a cyst on her ovary.  She is experiencing lower back pain and cramping.  Appt made for 12/5.

## 2024-09-29 ENCOUNTER — Encounter: Admitting: Obstetrics & Gynecology

## 2024-11-14 NOTE — Progress Notes (Signed)
 Abstraction Result Flowsheet Data  This patient's last AWV date: : 10/02/2022 This patients last WCC/CPE date: : Not Found   Reason for Encounter Reason for Encounter: Outreach Primary Reason for Outreach: AWV Text Message: No MyChart Message: No Outreach Call Outcome: Number Not in Service

## 2024-12-01 ENCOUNTER — Encounter: Admitting: Obstetrics & Gynecology

## 2025-01-15 ENCOUNTER — Encounter: Admitting: Obstetrics & Gynecology
